# Patient Record
Sex: Female | Born: 1950 | Race: White | Hispanic: No | Marital: Married | Smoking: Current every day smoker
Health system: Southern US, Community
[De-identification: ages and names within clinical notes are randomized; demographics above are authoritative.]

## PROBLEM LIST (undated history)

## (undated) DIAGNOSIS — G932 Benign intracranial hypertension: Secondary | ICD-10-CM

## (undated) DIAGNOSIS — C259 Malignant neoplasm of pancreas, unspecified: Secondary | ICD-10-CM

## (undated) DIAGNOSIS — M8580 Other specified disorders of bone density and structure, unspecified site: Secondary | ICD-10-CM

## (undated) DIAGNOSIS — Z800 Family history of malignant neoplasm of digestive organs: Secondary | ICD-10-CM

## (undated) DIAGNOSIS — Z803 Family history of malignant neoplasm of breast: Secondary | ICD-10-CM

## (undated) DIAGNOSIS — D649 Anemia, unspecified: Secondary | ICD-10-CM

## (undated) DIAGNOSIS — I2510 Atherosclerotic heart disease of native coronary artery without angina pectoris: Secondary | ICD-10-CM

## (undated) DIAGNOSIS — I499 Cardiac arrhythmia, unspecified: Secondary | ICD-10-CM

## (undated) DIAGNOSIS — J449 Chronic obstructive pulmonary disease, unspecified: Secondary | ICD-10-CM

## (undated) DIAGNOSIS — I10 Essential (primary) hypertension: Secondary | ICD-10-CM

## (undated) DIAGNOSIS — M1990 Unspecified osteoarthritis, unspecified site: Secondary | ICD-10-CM

## (undated) DIAGNOSIS — E785 Hyperlipidemia, unspecified: Secondary | ICD-10-CM

## (undated) DIAGNOSIS — K219 Gastro-esophageal reflux disease without esophagitis: Secondary | ICD-10-CM

## (undated) DIAGNOSIS — R918 Other nonspecific abnormal finding of lung field: Secondary | ICD-10-CM

## (undated) DIAGNOSIS — I2584 Coronary atherosclerosis due to calcified coronary lesion: Secondary | ICD-10-CM

## (undated) HISTORY — DX: Coronary atherosclerosis due to calcified coronary lesion: I25.84

## (undated) HISTORY — DX: Cardiac arrhythmia, unspecified: I49.9

## (undated) HISTORY — PX: TUBAL LIGATION: SHX77

## (undated) HISTORY — DX: Gastro-esophageal reflux disease without esophagitis: K21.9

## (undated) HISTORY — DX: Family history of malignant neoplasm of digestive organs: Z80.0

## (undated) HISTORY — DX: Benign intracranial hypertension: G93.2

## (undated) HISTORY — DX: Atherosclerotic heart disease of native coronary artery without angina pectoris: I25.10

## (undated) HISTORY — DX: Hyperlipidemia, unspecified: E78.5

## (undated) HISTORY — DX: Anemia, unspecified: D64.9

## (undated) HISTORY — DX: Unspecified osteoarthritis, unspecified site: M19.90

## (undated) HISTORY — DX: Other nonspecific abnormal finding of lung field: R91.8

## (undated) HISTORY — PX: VARICOSE VEIN SURGERY: SHX832

## (undated) HISTORY — PX: CHOLECYSTECTOMY: SHX55

## (undated) HISTORY — DX: Family history of malignant neoplasm of breast: Z80.3

## (undated) HISTORY — DX: Chronic obstructive pulmonary disease, unspecified: J44.9

## (undated) HISTORY — DX: Essential (primary) hypertension: I10

## (undated) HISTORY — DX: Other specified disorders of bone density and structure, unspecified site: M85.80

---

## 2015-03-05 ENCOUNTER — Encounter: Payer: Self-pay | Admitting: Vascular Surgery

## 2015-03-05 ENCOUNTER — Other Ambulatory Visit: Payer: Self-pay

## 2015-03-05 DIAGNOSIS — I872 Venous insufficiency (chronic) (peripheral): Secondary | ICD-10-CM

## 2015-03-05 DIAGNOSIS — R600 Localized edema: Secondary | ICD-10-CM

## 2015-04-16 ENCOUNTER — Encounter: Payer: Self-pay | Admitting: Vascular Surgery

## 2015-04-21 ENCOUNTER — Ambulatory Visit (INDEPENDENT_AMBULATORY_CARE_PROVIDER_SITE_OTHER): Payer: 59 | Admitting: Vascular Surgery

## 2015-04-21 ENCOUNTER — Encounter: Payer: Self-pay | Admitting: Vascular Surgery

## 2015-04-21 ENCOUNTER — Ambulatory Visit (HOSPITAL_COMMUNITY)
Admission: RE | Admit: 2015-04-21 | Discharge: 2015-04-21 | Disposition: A | Discharge status: Home / Self Care | Payer: 59 | Source: Ambulatory Visit | Attending: Vascular Surgery | Admitting: Vascular Surgery

## 2015-04-21 VITALS — BP 119/82 | HR 95 | Temp 98.2°F | Resp 18 | Ht 66.75 in | Wt 163.9 lb

## 2015-04-21 DIAGNOSIS — E785 Hyperlipidemia, unspecified: Secondary | ICD-10-CM | POA: Insufficient documentation

## 2015-04-21 DIAGNOSIS — R600 Localized edema: Secondary | ICD-10-CM | POA: Diagnosis not present

## 2015-04-21 DIAGNOSIS — I872 Venous insufficiency (chronic) (peripheral): Secondary | ICD-10-CM | POA: Insufficient documentation

## 2015-04-21 DIAGNOSIS — I10 Essential (primary) hypertension: Secondary | ICD-10-CM | POA: Insufficient documentation

## 2015-04-21 NOTE — Progress Notes (Signed)
[facility] [place]  Patient name: Rebecca Weaver MRN: [id number] DOB: 1950-08-09 Sex: female  REASON FOR CONSULT: leg swelling and edema. Referred by Dewayne Shorter, PA  HPI: Rebecca Weaver is a 65 y.o. female, with a long history of chronic venous insufficiency. She's had multiple procedures of both lower extremities for varicose veins. This was all done in [place]. She had stripping procedures in both legs initially as early as the late 1960s. In the last 10 years she has had ablation procedures on both lower extremities. 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 She has aching pain heaviness and swelling in both lower extremities which is aggravated by standing and relieved with elevation. She wears her knee-high 20-30 mmHg compression stockings faithfully. She is unaware of any previous history of DVT or phlebitis. She does have a family history of varicose veins.  She also uses a lymphedema pump.  I have reviewed the records that were sent with the patient. She's had multiple procedures on varicose veins of both lower extremities starting in 1980.  Past Medical History  Diagnosis Date  . Hypertension   . Hyperlipidemia   . GERD (gastroesophageal reflux disease)   . COPD (chronic obstructive pulmonary disease) ([place])   . Anemia   . Coronary artery calcification   . Lung nodules   . Osteopenia   . Arthritis   . Pseudotumor cerebri     Family History  Problem Relation Age of Onset  . Varicose Veins Father   . Varicose Veins Sister     SOCIAL HISTORY: Social History   Social History  . Marital Status: Married    Spouse Name: N/A  . Number of Children: N/A  . Years of Education: N/A   Occupational History  . Not on file.   Social History Main Topics  . Smoking status: Current Every Day Smoker -- 0.75 packs/day    Types: Cigarettes  . Smokeless tobacco: Not on file  . Alcohol Use: 0.0 oz/week    0  Standard drinks or equivalent per week     Comment: social occassions only  . Drug Use: Not on file  . Sexual Activity: Not on file   Other Topics Concern  . Not on file   Social History Narrative    Allergies  Allergen Reactions  . Ciprofloxacin     Per patient  "swollen tongue and breathing problems"   . Sulfa Antibiotics     Per patient "swollen tongue and breathing problems"    Current Outpatient Prescriptions  Medication Sig Dispense Refill  . amLODipine-benazepril (LOTREL) 5-20 MG capsule Take 1 capsule by mouth daily.    Marland Kitchen ezetimibe (ZETIA) 10 MG tablet Take 10 mg by mouth daily.    . fluticasone (FLONASE) 50 MCG/ACT nasal spray Place into both nostrils daily.    . Fluticasone-Salmeterol (ADVAIR) 250-50 MCG/DOSE AEPB Inhale 1 puff into the lungs 2 (two) times daily.    . hydrochlorothiazide (MICROZIDE) 12.5 MG capsule Take 12.5 mg by mouth daily.    Marland Kitchen loratadine (CLARITIN) 10 MG tablet Take 10 mg by mouth daily.    . ranitidine (ZANTAC) 75 MG tablet Take 75 mg by mouth 2 (two) times daily.    Marland Kitchen VITAMIN B1-B12 IJ Inject as directed every 30 (thirty) days.     No current facility-administered medications for this visit.    REVIEW OF SYSTEMS:  [X]  denotes positive finding, [ ]  denotes negative finding Cardiac  Comments:  Chest pain or chest pressure:    Shortness of breath upon exertion:    Short of breath when lying flat:    Irregular heart rhythm:        Vascular    Pain in calf, thigh, or hip brought on by ambulation:    Pain in feet at night that wakes you up from your sleep:     Blood clot in your veins:    Leg swelling:  X       Pulmonary    Oxygen at home:    Productive cough:     Wheezing:         Neurologic    Sudden weakness in arms or legs:     Sudden numbness in arms or legs:     Sudden onset of difficulty speaking or slurred speech:    Temporary loss of vision in one eye:     Problems with dizziness:         Gastrointestinal    Blood in  stool:     Vomited blood:         Genitourinary    Burning when urinating:     Blood in urine:        Psychiatric    Major depression:         Hematologic    Bleeding problems:    Problems with blood clotting too easily:        Skin    Rashes or ulcers:        Constitutional    Fever or chills:      PHYSICAL EXAM: Filed Vitals:   04/21/15 1316  BP: 119/82  Pulse: 95  Temp: 98.2 F (36.8 C)  TempSrc: Oral  Resp: 18  Height: 5' 6.75" (1.695 m)  Weight: 163 lb 14.4 oz (74.345 kg)  SpO2: 93%    GENERAL: The patient is a well-nourished female, in no acute distress. The vital signs are documented above. CARDIAC: There is a regular rate and rhythm.  VASCULAR: I do not detect carotid bruits. He has palpable femoral pulses. I cannot palpate pedal pulses. However, she has biphasic posterior tibial signals bilaterally. She has monophasic dorsalis pedis signals. She has mild bilateral lower tree swelling which is more significant on the left side. PULMONARY: There is good air exchange bilaterally without wheezing or rales. ABDOMEN: Soft and non-tender with normal pitched bowel sounds.  MUSCULOSKELETAL: There are no major deformities or cyanosis. NEUROLOGIC: No focal weakness or paresthesias are detected. SKIN: She has hyperpigmentation bilaterally consistent with chronic venous insufficiency. She has significant varicose veins of both lower extremities. PSYCHIATRIC: The patient has a normal affect.  DATA:   LOWER EXTREMITY VENOUS DUPLEX: I have independently interpreted her lower extremity venous duplex.  On the right side, there is no evidence of DVT. The right great saphenous vein has been ablated. There is reflux in the deep system on the right involving the common femoral vein, femoral vein and popliteal vein.  On the left side, there is no evidence of DVT. The left great saphenous vein and small saphenous vein have been ablated. There is deep vein reflux involving the  common femoral vein and femoral vein on the left.   MEDICAL ISSUES:  CHRONIC VENOUS INSUFFICIENCY: Her reflux involves the deep veins only. Her saphenous veins have been stripped and ablated. She does have significant varicose veins but excision of these would likely recur given her underlying deep venous reflux.  I've explained that chronic venous insufficiency is  a chronic problem. We have discussed the importance of intermittent leg elevation in the proper positioning for this. In addition, I have discussed the importance of wearing her compression stockings chest she does..  I have encouraged her to stay as active as possible and to avoid prolonged sitting and standing. We have also discussed water aerobics which I think is also very helpful for people with chronic venous insufficiency. See her back as needed.  Deitra Mayo [facility] [place]: [phone]

## 2015-04-28 ENCOUNTER — Encounter (INDEPENDENT_AMBULATORY_CARE_PROVIDER_SITE_OTHER): Payer: 59 | Admitting: Ophthalmology

## 2015-04-28 DIAGNOSIS — H35371 Puckering of macula, right eye: Secondary | ICD-10-CM | POA: Diagnosis not present

## 2015-04-28 DIAGNOSIS — I10 Essential (primary) hypertension: Secondary | ICD-10-CM

## 2015-04-28 DIAGNOSIS — H2513 Age-related nuclear cataract, bilateral: Secondary | ICD-10-CM

## 2015-04-28 DIAGNOSIS — H35033 Hypertensive retinopathy, bilateral: Secondary | ICD-10-CM

## 2015-04-28 DIAGNOSIS — H33021 Retinal detachment with multiple breaks, right eye: Secondary | ICD-10-CM

## 2015-04-28 DIAGNOSIS — H43813 Vitreous degeneration, bilateral: Secondary | ICD-10-CM | POA: Diagnosis not present

## 2015-05-14 ENCOUNTER — Ambulatory Visit (INDEPENDENT_AMBULATORY_CARE_PROVIDER_SITE_OTHER): Payer: 59 | Admitting: Ophthalmology

## 2015-05-14 DIAGNOSIS — H33301 Unspecified retinal break, right eye: Secondary | ICD-10-CM

## 2015-05-21 ENCOUNTER — Ambulatory Visit (INDEPENDENT_AMBULATORY_CARE_PROVIDER_SITE_OTHER): Payer: 59 | Admitting: Ophthalmology

## 2015-05-21 DIAGNOSIS — H33301 Unspecified retinal break, right eye: Secondary | ICD-10-CM

## 2015-09-23 ENCOUNTER — Ambulatory Visit (INDEPENDENT_AMBULATORY_CARE_PROVIDER_SITE_OTHER): Payer: 59 | Admitting: Ophthalmology

## 2015-09-30 ENCOUNTER — Ambulatory Visit (INDEPENDENT_AMBULATORY_CARE_PROVIDER_SITE_OTHER): Payer: 59 | Admitting: Ophthalmology

## 2015-09-30 DIAGNOSIS — H33301 Unspecified retinal break, right eye: Secondary | ICD-10-CM

## 2015-09-30 DIAGNOSIS — H43813 Vitreous degeneration, bilateral: Secondary | ICD-10-CM | POA: Diagnosis not present

## 2015-09-30 DIAGNOSIS — H2513 Age-related nuclear cataract, bilateral: Secondary | ICD-10-CM | POA: Diagnosis not present

## 2015-09-30 DIAGNOSIS — I10 Essential (primary) hypertension: Secondary | ICD-10-CM

## 2015-09-30 DIAGNOSIS — H35033 Hypertensive retinopathy, bilateral: Secondary | ICD-10-CM

## 2017-11-06 ENCOUNTER — Other Ambulatory Visit: Payer: Self-pay | Admitting: Family Medicine

## 2017-11-06 ENCOUNTER — Ambulatory Visit
Admission: RE | Admit: 2017-11-06 | Discharge: 2017-11-06 | Disposition: A | Discharge status: Home / Self Care | Payer: Medicare Other | Source: Ambulatory Visit | Attending: Family Medicine | Admitting: Family Medicine

## 2017-11-06 DIAGNOSIS — M545 Low back pain, unspecified: Secondary | ICD-10-CM

## 2017-11-13 ENCOUNTER — Other Ambulatory Visit: Payer: Self-pay | Admitting: Family Medicine

## 2017-11-13 DIAGNOSIS — Z87891 Personal history of nicotine dependence: Secondary | ICD-10-CM

## 2017-11-21 ENCOUNTER — Ambulatory Visit
Admission: RE | Admit: 2017-11-21 | Discharge: 2017-11-21 | Disposition: A | Discharge status: Home / Self Care | Payer: Medicare Other | Source: Ambulatory Visit | Attending: Family Medicine | Admitting: Family Medicine

## 2017-11-21 DIAGNOSIS — Z87891 Personal history of nicotine dependence: Secondary | ICD-10-CM

## 2019-06-02 ENCOUNTER — Ambulatory Visit: Payer: Medicare Other | Admitting: Cardiovascular Disease

## 2019-12-29 ENCOUNTER — Emergency Department (HOSPITAL_COMMUNITY): Payer: Medicare Other

## 2019-12-29 ENCOUNTER — Inpatient Hospital Stay (HOSPITAL_COMMUNITY)
Admission: EM | Admit: 2019-12-29 | Discharge: 2019-12-31 | DRG: 308 | Disposition: A | Discharge status: Home / Self Care | Payer: Medicare Other | Attending: Internal Medicine | Admitting: Internal Medicine

## 2019-12-29 ENCOUNTER — Encounter (HOSPITAL_COMMUNITY): Payer: Self-pay

## 2019-12-29 ENCOUNTER — Other Ambulatory Visit: Payer: Self-pay

## 2019-12-29 DIAGNOSIS — Z881 Allergy status to other antibiotic agents status: Secondary | ICD-10-CM

## 2019-12-29 DIAGNOSIS — K869 Disease of pancreas, unspecified: Secondary | ICD-10-CM | POA: Diagnosis present

## 2019-12-29 DIAGNOSIS — I2510 Atherosclerotic heart disease of native coronary artery without angina pectoris: Secondary | ICD-10-CM | POA: Diagnosis present

## 2019-12-29 DIAGNOSIS — I8390 Asymptomatic varicose veins of unspecified lower extremity: Secondary | ICD-10-CM | POA: Diagnosis present

## 2019-12-29 DIAGNOSIS — F17210 Nicotine dependence, cigarettes, uncomplicated: Secondary | ICD-10-CM | POA: Diagnosis present

## 2019-12-29 DIAGNOSIS — R918 Other nonspecific abnormal finding of lung field: Secondary | ICD-10-CM | POA: Diagnosis present

## 2019-12-29 DIAGNOSIS — Z20822 Contact with and (suspected) exposure to covid-19: Secondary | ICD-10-CM | POA: Diagnosis present

## 2019-12-29 DIAGNOSIS — R634 Abnormal weight loss: Secondary | ICD-10-CM | POA: Diagnosis present

## 2019-12-29 DIAGNOSIS — M1990 Unspecified osteoarthritis, unspecified site: Secondary | ICD-10-CM | POA: Diagnosis present

## 2019-12-29 DIAGNOSIS — G932 Benign intracranial hypertension: Secondary | ICD-10-CM | POA: Diagnosis present

## 2019-12-29 DIAGNOSIS — J449 Chronic obstructive pulmonary disease, unspecified: Secondary | ICD-10-CM | POA: Diagnosis present

## 2019-12-29 DIAGNOSIS — I4892 Unspecified atrial flutter: Secondary | ICD-10-CM | POA: Diagnosis not present

## 2019-12-29 DIAGNOSIS — I959 Hypotension, unspecified: Secondary | ICD-10-CM | POA: Diagnosis present

## 2019-12-29 DIAGNOSIS — K838 Other specified diseases of biliary tract: Secondary | ICD-10-CM | POA: Diagnosis present

## 2019-12-29 DIAGNOSIS — I4891 Unspecified atrial fibrillation: Secondary | ICD-10-CM | POA: Diagnosis present

## 2019-12-29 DIAGNOSIS — E782 Mixed hyperlipidemia: Secondary | ICD-10-CM | POA: Diagnosis present

## 2019-12-29 DIAGNOSIS — Q453 Other congenital malformations of pancreas and pancreatic duct: Secondary | ICD-10-CM

## 2019-12-29 DIAGNOSIS — J9601 Acute respiratory failure with hypoxia: Secondary | ICD-10-CM | POA: Diagnosis present

## 2019-12-29 DIAGNOSIS — Z882 Allergy status to sulfonamides status: Secondary | ICD-10-CM

## 2019-12-29 DIAGNOSIS — R1013 Epigastric pain: Secondary | ICD-10-CM | POA: Diagnosis not present

## 2019-12-29 DIAGNOSIS — Z79899 Other long term (current) drug therapy: Secondary | ICD-10-CM

## 2019-12-29 DIAGNOSIS — D7589 Other specified diseases of blood and blood-forming organs: Secondary | ICD-10-CM | POA: Diagnosis present

## 2019-12-29 DIAGNOSIS — I10 Essential (primary) hypertension: Secondary | ICD-10-CM | POA: Diagnosis present

## 2019-12-29 DIAGNOSIS — I872 Venous insufficiency (chronic) (peripheral): Secondary | ICD-10-CM | POA: Diagnosis present

## 2019-12-29 DIAGNOSIS — Z7951 Long term (current) use of inhaled steroids: Secondary | ICD-10-CM

## 2019-12-29 LAB — CBC
HCT: 44.9 % (ref 36.0–46.0)
Hemoglobin: 15 g/dL (ref 12.0–15.0)
MCH: 34.4 pg — ABNORMAL HIGH (ref 26.0–34.0)
MCHC: 33.4 g/dL (ref 30.0–36.0)
MCV: 103 fL — ABNORMAL HIGH (ref 80.0–100.0)
Platelets: 199 10*3/uL (ref 150–400)
RBC: 4.36 MIL/uL (ref 3.87–5.11)
RDW: 12.6 % (ref 11.5–15.5)
WBC: 8 10*3/uL (ref 4.0–10.5)
nRBC: 0 % (ref 0.0–0.2)

## 2019-12-29 LAB — COMPREHENSIVE METABOLIC PANEL
ALT: 16 U/L (ref 0–44)
AST: 16 U/L (ref 15–41)
Albumin: 3.7 g/dL (ref 3.5–5.0)
Alkaline Phosphatase: 52 U/L (ref 38–126)
Anion gap: 12 (ref 5–15)
BUN: 15 mg/dL (ref 8–23)
CO2: 24 mmol/L (ref 22–32)
Calcium: 9 mg/dL (ref 8.9–10.3)
Chloride: 101 mmol/L (ref 98–111)
Creatinine, Ser: 0.79 mg/dL (ref 0.44–1.00)
GFR, Estimated: >60 mL/min (ref >60)
Glucose, Bld: 81 mg/dL (ref 70–99)
Potassium: 3.7 mmol/L (ref 3.5–5.1)
Sodium: 137 mmol/L (ref 135–145)
Total Bilirubin: 1.1 mg/dL (ref 0.3–1.2)
Total Protein: 6 g/dL — ABNORMAL LOW (ref 6.5–8.1)

## 2019-12-29 LAB — URINALYSIS, ROUTINE W REFLEX MICROSCOPIC
Bilirubin Urine: NEGATIVE
Glucose, UA: NEGATIVE mg/dL
Hgb urine dipstick: NEGATIVE
Ketones, ur: NEGATIVE mg/dL
Leukocytes,Ua: NEGATIVE
Nitrite: NEGATIVE
Protein, ur: NEGATIVE mg/dL
Specific Gravity, Urine: 1.01 (ref 1.005–1.030)
pH: 5 (ref 5.0–8.0)

## 2019-12-29 LAB — BRAIN NATRIURETIC PEPTIDE: B Natriuretic Peptide: 65.4 pg/mL (ref 0.0–100.0)

## 2019-12-29 LAB — LIPASE, BLOOD: Lipase: 132 U/L — ABNORMAL HIGH (ref 11–51)

## 2019-12-29 LAB — TROPONIN I (HIGH SENSITIVITY): Troponin I (High Sensitivity): 7 ng/L (ref <18)

## 2019-12-29 MED ORDER — DILTIAZEM HCL-DEXTROSE 125-5 MG/125ML-% IV SOLN (PREMIX)
5.0000 mg/h | INTRAVENOUS | Status: DC
  Administered 2019-12-29: 5 mg/h via INTRAVENOUS
  Filled 2019-12-29: qty 125

## 2019-12-29 MED ORDER — HEPARIN BOLUS VIA INFUSION
3000.0000 [IU] | Freq: Once | INTRAVENOUS | Status: DC
  Filled 2019-12-29: qty 3000

## 2019-12-29 MED ORDER — ONDANSETRON HCL 4 MG/2ML IJ SOLN
4.0000 mg | Freq: Once | INTRAMUSCULAR | Status: AC
  Administered 2019-12-29: 4 mg via INTRAVENOUS
  Filled 2019-12-29: qty 2

## 2019-12-29 MED ORDER — MORPHINE SULFATE (PF) 4 MG/ML IV SOLN
4.0000 mg | Freq: Once | INTRAVENOUS | Status: AC
  Administered 2019-12-29: 4 mg via INTRAVENOUS
  Filled 2019-12-29: qty 1

## 2019-12-29 MED ORDER — IOHEXOL 350 MG/ML SOLN
100.0000 mL | Freq: Once | INTRAVENOUS | Status: AC | PRN
  Administered 2019-12-29: 100 mL via INTRAVENOUS

## 2019-12-29 MED ORDER — DILTIAZEM LOAD VIA INFUSION
10.0000 mg | Freq: Once | INTRAVENOUS | Status: AC
  Administered 2019-12-29: 10 mg via INTRAVENOUS
  Filled 2019-12-29: qty 10

## 2019-12-29 MED ORDER — HEPARIN (PORCINE) 25000 UT/250ML-% IV SOLN: 900.0000 [IU]/h | INTRAVENOUS | Status: DC

## 2019-12-29 MED ORDER — SODIUM CHLORIDE 0.9 % IV BOLUS
500.0000 mL | Freq: Once | INTRAVENOUS | Status: AC
  Administered 2019-12-29: 500 mL via INTRAVENOUS

## 2019-12-29 NOTE — ED Triage Notes (Signed)
Pt from pcp office with ems for further evaluation of LUQ pain for 3 days, no n/v. Hx of diverticulitis, no bloody stools noted. Pt also in a fib rvr at a rate of 150 upon ems arrival, after 500 fluid pt rate ranging from 115-125bpm. Pt a.o, nad noted

## 2019-12-29 NOTE — Progress Notes (Addendum)
ANTICOAGULATION CONSULT NOTE - Initial Consult  Pharmacy Consult for apixaban Indication: atrial fibrillation  Allergies  Allergen Reactions  . Ciprofloxacin     Per patient  "swollen tongue and breathing problems"   . Sulfa Antibiotics     Per patient "swollen tongue and breathing problems"    Patient Measurements: Height: 5\' 7"  (170.2 cm) Weight: 64.4 kg (142 lb) IBW/kg (Calculated) : 61.6  Vital Signs: Temp: 97.8 F (36.6 C) (10/18 2116) Temp Source: Oral (10/18 2116) BP: 106/68 (10/18 2330) Pulse Rate: 107 (10/18 2330)  Labs: Recent Labs    12/29/19 1821 12/29/19 2138  HGB 15.0  --   HCT 44.9  --   PLT 199  --   CREATININE 0.79  --   TROPONINIHS  --  7    Estimated Creatinine Clearance: 66.4 mL/min (by C-G formula based on SCr of 0.79 mg/dL).   Medical History: Past Medical History:  Diagnosis Date  . Anemia   . Arthritis   . COPD (chronic obstructive pulmonary disease) ([place])   . Coronary artery calcification   . GERD (gastroesophageal reflux disease)   . Hyperlipidemia   . Hypertension   . Lung nodules   . Osteopenia   . Pseudotumor cerebri     Assessment: 69yo female c/o abdominal pain and tachycardia, found to be in Aflutter w/ RVR, no h/o dysrhythmias, to begin anticoagulation with Eliquis.  Plan:  Eliquis 5mg  PO BID. Start Eliquis education.  Wynona Neat, PharmD, BCPS  12/29/2019,11:50 PM

## 2019-12-29 NOTE — ED Provider Notes (Incomplete)
[facility] EMERGENCY DEPARTMENT Provider Note   CSN: [id number] Arrival date & time: 12/29/19  1811     History Chief Complaint  Patient presents with  . Abdominal Pain  . Tachycardia    Rebecca Weaver is a 69 y.o. female.  HPI     Past Medical History:  Diagnosis Date  . Anemia   . Arthritis   . COPD (chronic obstructive pulmonary disease) ([place])   . Coronary artery calcification   . GERD (gastroesophageal reflux disease)   . Hyperlipidemia   . Hypertension   . Lung nodules   . Osteopenia   . Pseudotumor cerebri     There are no problems to display for this patient.   Past Surgical History:  Procedure Laterality Date  . CHOLECYSTECTOMY    . TUBAL LIGATION    . VARICOSE VEIN SURGERY     multiple     OB History   No obstetric history on file.     Family History  Problem Relation Age of Onset  . Varicose Veins Father   . Varicose Veins Sister     Social History   Tobacco Use  . Smoking status: Current Every Day Smoker    Packs/day: 0.75    Types: Cigarettes  Substance Use Topics  . Alcohol use: Yes    Alcohol/week: 0.0 standard drinks    Comment: social occassions only  . Drug use: Not on file    Home Medications Prior to Admission medications   Medication Sig Start Date End Date Taking? Authorizing Provider  amLODipine-benazepril (LOTREL) 5-20 MG capsule Take 1 capsule by mouth daily.    [provider]  ezetimibe (ZETIA) 10 MG tablet Take 10 mg by mouth daily.    [provider]  fluticasone (FLONASE) 50 MCG/ACT nasal spray Place into both nostrils daily.    [provider]  Fluticasone-Salmeterol (ADVAIR) 250-50 MCG/DOSE AEPB Inhale 1 puff into the lungs 2 (two) times daily.    [provider]  hydrochlorothiazide (MICROZIDE) 12.5 MG capsule Take 12.5 mg by mouth daily.    [provider]  loratadine (CLARITIN) 10 MG tablet Take 10 mg by mouth daily.    [provider]  ranitidine (ZANTAC) 75 MG tablet Take 75 mg by mouth 2 (two) times daily.    [provider]  VITAMIN B1-B12 IJ Inject as directed every 30 (thirty) days.    [provider]    Allergies    Ciprofloxacin and Sulfa antibiotics  Review of Systems   Review of Systems  Physical Exam Updated Vital Signs BP 102/87   Pulse 66   Temp 97.8 F (36.6 C) (Oral)   Resp 18   Ht 5\' 7"  (1.702 m)   Wt 64.4 kg   SpO2 96%   BMI 22.24 kg/m   Physical Exam  ED Results / Procedures / Treatments   Labs (all labs ordered are listed, but only abnormal results are displayed) Labs Reviewed  LIPASE, BLOOD - Abnormal; Notable for the following components:      Result Value   Lipase 132 (*)    All other components within normal limits  COMPREHENSIVE METABOLIC PANEL - Abnormal; Notable for the following components:   Total Protein 6.0 (*)    All other components within normal limits  CBC - Abnormal; Notable for the following components:   MCV 103.0 (*)    MCH 34.4 (*)    All other components within normal limits  URINALYSIS, ROUTINE  W REFLEX MICROSCOPIC    EKG EKG Interpretation  Date/Time:  Monday December 29 2019 18:20:14 EDT Ventricular Rate:  142 PR Interval:  134 QRS Duration: 116 QT Interval:  320 QTC Calculation: 492 R Axis:   92 Text Interpretation: Unclear rhythm, suspect sinus tachycardia Incomplete right bundle branch block Possible Right ventricular hypertrophy Septal infarct , age undetermined ST & T wave abnormality, consider inferior ischemia ST & T wave abnormality, consider anterolateral ischemia Abnormal ECG Confirmed by Gareth Morgan [phone]) on 12/29/2019 9:02:09 PM   Radiology DG Chest 2 View  Result Date: 12/29/2019 CLINICAL DATA:  Tachycardia EXAM: CHEST - 2 VIEW COMPARISON:  06/06/2016, CT 12/04/2018 FINDINGS: Emphysematous disease and hyperinflation. No acute consolidation or pleural effusion. Stable cardiomediastinal silhouette with  prominent central pulmonary arteries. Aortic atherosclerosis. No pneumothorax. IMPRESSION: No active cardiopulmonary disease. Emphysematous disease. Electronically Signed   By: Donavan Foil M.D.   On: 12/29/2019 18:39    Procedures Procedures (including critical care time)  Medications Ordered in ED Medications - No data to display  ED Course  I have reviewed the triage vital signs and the nursing notes.  Pertinent labs & imaging results that were available during my care of the patient were reviewed by me and considered in my medical decision making (see chart for details).    MDM Rules/Calculators/A&P                          *** Final Clinical Impression(s) / ED Diagnoses Final diagnoses:  None    Rx / DC Orders ED Discharge Orders    None

## 2019-12-29 NOTE — ED Notes (Signed)
Patient transported to CT 

## 2019-12-29 NOTE — ED Notes (Signed)
Pt dropped to 88% on room air. This writer placed pt on O2 2L . Pt at 94%.

## 2019-12-29 NOTE — ED Provider Notes (Signed)
[facility]ER[facility]ENC[facility] DEPARTMENT Provider Note   CSN: [id number] Arrival date & time: 12/29/19  1811     History Chief Complaint  Patient presents with  . Abdominal Pain  . Tachycardia    Rebecca Weaver is a 69 y.o. female.  HPI      69 year old female with history of COPD, coronary artery calcification, hypertension, hyperlipidemia, pseudotumor cerebri, vitamin B12 deficiency, who presents with concern for abdominal pain and found to have tachycardia/atrial flutter on PCP visit today.  Reports over the weekend developed abdominal pain that she describes as epigastric going from the right and to the left upper quadrant.  Reports that the pain has been constant and was associated with nausea, vomiting and diarrhea.  Reports that she had 6 episodes of nonbloody nonblack stools, and 4 episodes of vomiting.  Reports that since this morning she is no longer having nausea, vomiting or diarrhea.  Denies fevers.  Denies dysuria.  Reports that she was just on a trip to [place], where she flew to [place] and then drove approximately 2000 miles in the car.  Reports she smokes cigarettes, and socially drinks alcohol, but has not been drinking more recently.  She noticed that a vein in her left leg appeared hard.  Reports she did not realize that her heart rate was so higher that she was in an abnormal heart rhythm until her PCP had told her.  Denies chest pain, shortness of breath, lightheadedness.  Does not have any history of cardiac abnormalities.  Past Medical History:  Diagnosis Date  . Anemia   . Arthritis   . COPD (chronic obstructive pulmonary disease) ([place])   . Coronary artery calcification   . GERD (gastroesophageal reflux disease)   . Hyperlipidemia   . Hypertension   . Lung nodules   . Osteopenia   . Pseudotumor cerebri     There are no problems to display for this patient.   Past Surgical History:  Procedure Laterality Date  . CHOLECYSTECTOMY    .  TUBAL LIGATION    . VARICOSE VEIN SURGERY     multiple     OB History   No obstetric history on file.     Family History  Problem Relation Age of Onset  . Varicose Veins Father   . Varicose Veins Sister     Social History   Tobacco Use  . Smoking status: Current Every Day Smoker    Packs/day: 0.75    Types: Cigarettes  Substance Use Topics  . Alcohol use: Yes    Alcohol/week: 0.0 standard drinks    Comment: social occassions only  . Drug use: Not on file    Home Medications Prior to Admission medications   Medication Sig Start Date End Date Taking? Authorizing Provider  amLODipine-benazepril (LOTREL) 5-20 MG capsule Take 1 capsule by mouth daily.    [provider]  ezetimibe (ZETIA) 10 MG tablet Take 10 mg by mouth daily.    [provider]  fluticasone (FLONASE) 50 MCG/ACT nasal spray Place into both nostrils daily.    [provider]  Fluticasone-Salmeterol (ADVAIR) 250-50 MCG/DOSE AEPB Inhale 1 puff into the lungs 2 (two) times daily.    [provider]  hydrochlorothiazide (MICROZIDE) 12.5 MG capsule Take 12.5 mg by mouth daily.    [provider]  loratadine (CLARITIN) 10 MG tablet Take 10 mg by mouth daily.    [provider]  ranitidine (ZANTAC) 75 MG tablet Take 75 mg by mouth  2 (two) times daily.    [provider]  VITAMIN B1-B12 IJ Inject as directed every 30 (thirty) days.    [provider]    Allergies    Ciprofloxacin and Sulfa antibiotics  Review of Systems   Review of Systems  Constitutional: Positive for fatigue. Negative for fever.  HENT: Negative for sore throat.   Eyes: Negative for visual disturbance.  Respiratory: Negative for cough and shortness of breath.   Cardiovascular: Positive for leg swelling. Negative for chest pain.  Gastrointestinal: Positive for abdominal pain, diarrhea, nausea and vomiting.  Genitourinary: Negative for difficulty urinating and dysuria.    Musculoskeletal: Negative for back pain and neck pain.  Skin: Negative for rash.  Neurological: Negative for syncope, light-headedness and headaches.    Physical Exam Updated Vital Signs BP 106/68   Pulse (!) 107   Temp 97.8 F (36.6 C) (Oral)   Resp 15   Ht 5\' 7"  (1.702 m)   Wt 64.4 kg   SpO2 95%   BMI 22.24 kg/m   Physical Exam Vitals and nursing note reviewed.  Constitutional:      General: She is not in acute distress.    Appearance: She is well-developed. She is not diaphoretic.  HENT:     Head: Normocephalic and atraumatic.  Eyes:     Conjunctiva/sclera: Conjunctivae normal.  Cardiovascular:     Rate and Rhythm: Tachycardia present. Rhythm irregularly irregular.     Heart sounds: Normal heart sounds. No murmur heard.  No friction rub. No gallop.   Pulmonary:     Effort: Pulmonary effort is normal. No respiratory distress.     Breath sounds: Normal breath sounds. No wheezing or rales.  Abdominal:     General: There is no distension.     Palpations: Abdomen is soft.     Tenderness: There is abdominal tenderness in the epigastric area. There is no guarding.  Musculoskeletal:        General: No tenderness.     Cervical back: Normal range of motion.  Skin:    General: Skin is warm and dry.     Findings: No erythema or rash.  Neurological:     Mental Status: She is alert and oriented to person, place, and time.     ED Results / Procedures / Treatments   Labs (all labs ordered are listed, but only abnormal results are displayed) Labs Reviewed  LIPASE, BLOOD - Abnormal; Notable for the following components:      Result Value   Lipase 132 (*)    All other components within normal limits  COMPREHENSIVE METABOLIC PANEL - Abnormal; Notable for the following components:   Total Protein 6.0 (*)    All other components within normal limits  CBC - Abnormal; Notable for the following components:   MCV 103.0 (*)    MCH 34.4 (*)    All other components within  normal limits  RESPIRATORY PANEL BY RT PCR (FLU A&B, COVID)  URINALYSIS, ROUTINE W REFLEX MICROSCOPIC  BRAIN NATRIURETIC PEPTIDE  TROPONIN I (HIGH SENSITIVITY)  TROPONIN I (HIGH SENSITIVITY)    EKG EKG Interpretation  Date/Time:  Monday December 29 2019 22:22:11 EDT Ventricular Rate:  135 PR Interval:  134 QRS Duration: 109 QT Interval:  371 QTC Calculation: 557 R Axis:   101 Text Interpretation: Atrial flutter with predominant 2:1 AV block Right ventricular hypertrophy Repol abnrm suggests ischemia, diffuse leads Prolonged QT interval No significant change since last tracing Confirmed by Gareth Morgan [phone]) on  12/29/2019 10:37:05 PM   Radiology DG Chest 2 View  Result Date: 12/29/2019 CLINICAL DATA:  Tachycardia EXAM: CHEST - 2 VIEW COMPARISON:  06/06/2016, CT 12/04/2018 FINDINGS: Emphysematous disease and hyperinflation. No acute consolidation or pleural effusion. Stable cardiomediastinal silhouette with prominent central pulmonary arteries. Aortic atherosclerosis. No pneumothorax. IMPRESSION: No active cardiopulmonary disease. Emphysematous disease. Electronically Signed   By: Donavan Foil M.D.   On: 12/29/2019 18:39   CT Angio Chest PE W and/or Wo Contrast  Result Date: 12/29/2019 CLINICAL DATA:  69 year old female with left upper quadrant abdominal pain. History of diverticulitis. Concern for mesenteric ischemia. EXAM: CT ANGIOGRAPHY CHEST, ABDOMEN AND PELVIS TECHNIQUE: Non-contrast CT of the chest was initially obtained. Multidetector CT imaging through the chest, abdomen and pelvis was performed using the standard protocol during bolus administration of intravenous contrast. Multiplanar reconstructed images and MIPs were obtained and reviewed to evaluate the vascular anatomy. CONTRAST:  137mL OMNIPAQUE IOHEXOL 350 MG/ML SOLN COMPARISON:  Chest radiograph dated 12/29/2019 and CT dated 11/21/2017. FINDINGS: Evaluation is limited due to streak artifact 0 caused by patient's  arms. CTA CHEST FINDINGS Cardiovascular: There is no cardiomegaly or pericardial effusion. Three-vessel coronary vascular calcification. There is moderate atherosclerotic calcification of the thoracic aorta. No aneurysmal dilatation. Evaluation of the aorta is limited due to suboptimal opacification and timing of the contrast. No pulmonary artery embolus identified. Mediastinum/Nodes: Top-normal right hilar lymph node measures 11 mm. Subcarinal lymph node measures 14 mm in short axis. The esophagus is grossly unremarkable. No mediastinal fluid collection. Lungs/Pleura: There is an 11 mm right upper lobe nodule (35/1). This nodule is new since the prior CT and concerning for malignancy. A 6 mm left upper lobe nodule (43/1) also new compared to the prior CT. There is a 4 mm left upper lobe nodule (33/1). Several additional small nodules including a 4 mm subpleural nodule in the right lower lobe (90/1) and a 3 mm nodule in the right upper lobe (38/1). These nodules are new since the prior CT and concerning for malignancy or metastatic disease. No focal consolidation, pleural effusion, or pneumothorax. The central airways are patent. Musculoskeletal: There is a 15 mm lucent lesion with sclerotic margin in the T12, present on the prior CT. This lesion is indeterminate. There is osteopenia. A small T5 sclerotic focus was present on the prior CT and may represent a bone island. No acute osseous pathology. 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 Review of the MIP images confirms the above findings. CTA ABDOMEN AND PELVIS FINDINGS VASCULAR Aorta: Moderate atherosclerotic calcification. No aneurysmal dilatation or dissection. No periaortic fluid collection. Celiac: Patent without evidence of aneurysm, dissection, vasculitis or significant stenosis. SMA: Patent without evidence of aneurysm, dissection, vasculitis or significant stenosis. Renals: Both renal arteries are patent  without evidence of aneurysm, dissection, vasculitis, fibromuscular dysplasia or significant stenosis. IMA: Patent without evidence of aneurysm, dissection, vasculitis or significant stenosis. Inflow: Atherosclerotic calcification of the iliac arteries. No aneurysmal dilatation or dissection. The iliac arteries are patent. Veins: The IVC is unremarkable. The SMV, and main portal vein are patent. No portal venous gas. Review of the MIP images confirms the above findings. NON-VASCULAR No intra-abdominal free air or free fluid. Hepatobiliary: The liver is unremarkable. No intrahepatic biliary dilatation. The gallbladder contains air likely related to recent procedure. Clinical correlation recommended. No calcified gallstone or pericholecystic fluid. There is mild dilatation of the central biliary trees. The common bile duct measures 12 mm in diameter. No  calcified stone noted in the central CBD. Pancreas: There is an ill-defined hypodense lesion in the body of the pancreas measuring approximately 2.5 x 1.5 cm (36/6. This is not characterized but concerning for malignancy. Further characterization with MRI without and with contrast recommended. There is an ill-defined hypodense extra pancreatic nodular density posterior to the pancreas adjacent to the SMA likely extension of the pancreatic lesion. No active inflammatory changes of the pancreas. Spleen: Normal in size without focal abnormality. Adrenals/Urinary Tract: The adrenal glands unremarkable. Bilateral renal cortical scarring. Small right renal cysts measure up to 2 cm in the upper pole of the right kidney. There is no hydronephrosis on either side. The visualized ureters and urinary bladder appear unremarkable. Stomach/Bowel: There is severe sigmoid and diffuse colonic diverticulosis without active inflammatory changes. There is moderate stool throughout the colon. There is no bowel obstruction or active inflammation. The appendix is normal. Lymphatic: No  adenopathy. Reproductive: Ill-defined posterior body uterine fibroids measure up to approximately 4 cm. No adnexal masses. Other: Right groin surgical clips. Musculoskeletal: Osteopenia with degenerative changes of the spine. No acute osseous pathology. Review of the MIP images confirms the above findings. IMPRESSION: 1. No CT evidence of pulmonary embolism or aortic dissection. 2. Ill-defined hypodense lesion in the body of the pancreas concerning for malignancy. Further characterization with MRI without and with contrast recommended. 3. Multiple bilateral pulmonary nodules measure up to 11 mm in the right upper lobe concerning for malignancy or metastatic disease. Clinical correlation and multidisciplinary consult is advised. 4. Severe colonic diverticulosis. No bowel obstruction. Normal appendix. 5. Aortic Atherosclerosis (ICD10-I70.0). Electronically Signed   By: Anner Crete M.D.   On: 12/29/2019 22:39   CT Angio Abd/Pel W and/or Wo Contrast  Result Date: 12/29/2019 CLINICAL DATA:  69 year old female with left upper quadrant abdominal pain. History of diverticulitis. Concern for mesenteric ischemia. EXAM: CT ANGIOGRAPHY CHEST, ABDOMEN AND PELVIS TECHNIQUE: Non-contrast CT of the chest was initially obtained. Multidetector CT imaging through the chest, abdomen and pelvis was performed using the standard protocol during bolus administration of intravenous contrast. Multiplanar reconstructed images and MIPs were obtained and reviewed to evaluate the vascular anatomy. CONTRAST:  118mL OMNIPAQUE IOHEXOL 350 MG/ML SOLN COMPARISON:  Chest radiograph dated 12/29/2019 and CT dated 11/21/2017. FINDINGS: Evaluation is limited due to streak artifact 0 caused by patient's arms. CTA CHEST FINDINGS Cardiovascular: There is no cardiomegaly or pericardial effusion. Three-vessel coronary vascular calcification. There is moderate atherosclerotic calcification of the thoracic aorta. No aneurysmal dilatation. Evaluation of  the aorta is limited due to suboptimal opacification and timing of the contrast. No pulmonary artery embolus identified. Mediastinum/Nodes: Top-normal right hilar lymph node measures 11 mm. Subcarinal lymph node measures 14 mm in short axis. The esophagus is grossly unremarkable. No mediastinal fluid collection. Lungs/Pleura: There is an 11 mm right upper lobe nodule (35/1). This nodule is new since the prior CT and concerning for malignancy. A 6 mm left upper lobe nodule (43/1) also new compared to the prior CT. There is a 4 mm left upper lobe nodule (33/1). Several additional small nodules including a 4 mm subpleural nodule in the right lower lobe (90/1) and a 3 mm nodule in the right upper lobe (38/1). These nodules are new since the prior CT and concerning for malignancy or metastatic disease. No focal consolidation, pleural effusion, or pneumothorax. The central airways are patent. Musculoskeletal: There is a 15 mm lucent lesion with sclerotic margin in the T12, present on the prior CT. This lesion is  indeterminate. There is osteopenia. A small T5 sclerotic focus was present on the prior CT and may represent a bone island. No acute osseous pathology. 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 Review of the MIP images confirms the above findings. CTA ABDOMEN AND PELVIS FINDINGS VASCULAR Aorta: Moderate atherosclerotic calcification. No aneurysmal dilatation or dissection. No periaortic fluid collection. Celiac: Patent without evidence of aneurysm, dissection, vasculitis or significant stenosis. SMA: Patent without evidence of aneurysm, dissection, vasculitis or significant stenosis. Renals: Both renal arteries are patent without evidence of aneurysm, dissection, vasculitis, fibromuscular dysplasia or significant stenosis. IMA: Patent without evidence of aneurysm, dissection, vasculitis or significant stenosis. Inflow: Atherosclerotic calcification of the iliac  arteries. No aneurysmal dilatation or dissection. The iliac arteries are patent. Veins: The IVC is unremarkable. The SMV, and main portal vein are patent. No portal venous gas. Review of the MIP images confirms the above findings. NON-VASCULAR No intra-abdominal free air or free fluid. Hepatobiliary: The liver is unremarkable. No intrahepatic biliary dilatation. The gallbladder contains air likely related to recent procedure. Clinical correlation recommended. No calcified gallstone or pericholecystic fluid. There is mild dilatation of the central biliary trees. The common bile duct measures 12 mm in diameter. No calcified stone noted in the central CBD. Pancreas: There is an ill-defined hypodense lesion in the body of the pancreas measuring approximately 2.5 x 1.5 cm (36/6. This is not characterized but concerning for malignancy. Further characterization with MRI without and with contrast recommended. There is an ill-defined hypodense extra pancreatic nodular density posterior to the pancreas adjacent to the SMA likely extension of the pancreatic lesion. No active inflammatory changes of the pancreas. Spleen: Normal in size without focal abnormality. Adrenals/Urinary Tract: The adrenal glands unremarkable. Bilateral renal cortical scarring. Small right renal cysts measure up to 2 cm in the upper pole of the right kidney. There is no hydronephrosis on either side. The visualized ureters and urinary bladder appear unremarkable. Stomach/Bowel: There is severe sigmoid and diffuse colonic diverticulosis without active inflammatory changes. There is moderate stool throughout the colon. There is no bowel obstruction or active inflammation. The appendix is normal. Lymphatic: No adenopathy. Reproductive: Ill-defined posterior body uterine fibroids measure up to approximately 4 cm. No adnexal masses. Other: Right groin surgical clips. Musculoskeletal: Osteopenia with degenerative changes of the spine. No acute osseous  pathology. Review of the MIP images confirms the above findings. IMPRESSION: 1. No CT evidence of pulmonary embolism or aortic dissection. 2. Ill-defined hypodense lesion in the body of the pancreas concerning for malignancy. Further characterization with MRI without and with contrast recommended. 3. Multiple bilateral pulmonary nodules measure up to 11 mm in the right upper lobe concerning for malignancy or metastatic disease. Clinical correlation and multidisciplinary consult is advised. 4. Severe colonic diverticulosis. No bowel obstruction. Normal appendix. 5. Aortic Atherosclerosis (ICD10-I70.0). Electronically Signed   By: Anner Crete M.D.   On: 12/29/2019 22:39    Procedures .Critical Care Performed by: Gareth Morgan, MD Authorized by: Gareth Morgan, MD   Critical care provider statement:    Critical care time (minutes):  45   Critical care was time spent personally by me on the following activities:  Evaluation of patient's response to treatment, examination of patient, ordering and performing treatments and interventions, ordering and review of laboratory studies, ordering and review of radiographic studies, pulse oximetry, re-evaluation of patient's condition, obtaining history from patient or surrogate and review of old charts   (including critical care time)  Medications Ordered in ED Medications  diltiazem (CARDIZEM) 1 mg/mL load via infusion 10 mg (10 mg Intravenous Bolus from Bag 12/29/19 2301)    And  diltiazem (CARDIZEM) 125 mg in dextrose 5% 125 mL (1 mg/mL) infusion (5 mg/hr Intravenous Rate/Dose Verify 12/29/19 2330)  sodium chloride 0.9 % bolus 500 mL (0 mLs Intravenous Stopped 12/29/19 2309)  ondansetron (ZOFRAN) injection 4 mg (4 mg Intravenous Given 12/29/19 2140)  morphine 4 MG/ML injection 4 mg (4 mg Intravenous Given 12/29/19 2140)  iohexol (OMNIPAQUE) 350 MG/ML injection 100 mL (100 mLs Intravenous Contrast Given 12/29/19 2154)    ED Course  I have  reviewed the triage vital signs and the nursing notes.  Pertinent labs & imaging results that were available during my care of the patient were reviewed by me and considered in my medical decision making (see chart for details).    MDM Rules/Calculators/A&P                          69 year old female with history of COPD, coronary artery calcification, hypertension, hyperlipidemia, pseudotumor cerebri, vitamin B12 deficiency, who presents with concern for abdominal pain and found to have tachycardia/atrial flutter on PCP visit today.  Given recent travel, leg pain and swelling, new onset atrial flutter, CT PE study was done which showed no evidence of pulmonary embolism, but did show multiple bilateral pulmonary nodules.  Given abdominal pain with atrial fibrillation, CT angio abdomen pelvis was ordered which showed no evidence of mesenteric ischemia nor diverticulitis, but did show concern for abnormality of the pancreas.  Lipase is elevated on lab work, but not significant enough to be pancreatitis.  CHADS2Vasc 3.  Will initiate heparin drip.    Heart rate initially 135 on arrival.  Given bolus and gtt of diltiazem with some improvement but continued elevated rate to 110s.  Will admit for atrial flutter with RVR and also obtain MRI abdomen for further evaluation of possible malignancy.  Ordered DVT study of LLE to be done as inpatient.  Currently on 2L of O2 secondary to desaturation after receiving pain medication.  Final Clinical Impression(s) / ED Diagnoses Final diagnoses:  Atrial flutter, unspecified type [facility])  Pulmonary nodules  Pancreatic abnormality  Epigastric pain    Rx / DC Orders ED Discharge Orders    None       Gareth Morgan, MD 12/29/19 2352

## 2019-12-30 ENCOUNTER — Observation Stay (HOSPITAL_COMMUNITY): Payer: Medicare Other

## 2019-12-30 ENCOUNTER — Observation Stay (HOSPITAL_BASED_OUTPATIENT_CLINIC_OR_DEPARTMENT_OTHER): Payer: Medicare Other

## 2019-12-30 ENCOUNTER — Encounter (HOSPITAL_COMMUNITY): Payer: Self-pay | Admitting: Internal Medicine

## 2019-12-30 DIAGNOSIS — M79609 Pain in unspecified limb: Secondary | ICD-10-CM

## 2019-12-30 DIAGNOSIS — I4891 Unspecified atrial fibrillation: Secondary | ICD-10-CM | POA: Diagnosis present

## 2019-12-30 DIAGNOSIS — I10 Essential (primary) hypertension: Secondary | ICD-10-CM | POA: Diagnosis present

## 2019-12-30 DIAGNOSIS — G932 Benign intracranial hypertension: Secondary | ICD-10-CM | POA: Diagnosis present

## 2019-12-30 DIAGNOSIS — J9601 Acute respiratory failure with hypoxia: Secondary | ICD-10-CM | POA: Diagnosis present

## 2019-12-30 DIAGNOSIS — E782 Mixed hyperlipidemia: Secondary | ICD-10-CM | POA: Diagnosis present

## 2019-12-30 DIAGNOSIS — Z882 Allergy status to sulfonamides status: Secondary | ICD-10-CM | POA: Diagnosis not present

## 2019-12-30 DIAGNOSIS — Z7951 Long term (current) use of inhaled steroids: Secondary | ICD-10-CM | POA: Diagnosis not present

## 2019-12-30 DIAGNOSIS — K838 Other specified diseases of biliary tract: Secondary | ICD-10-CM | POA: Diagnosis present

## 2019-12-30 DIAGNOSIS — I4892 Unspecified atrial flutter: Secondary | ICD-10-CM

## 2019-12-30 DIAGNOSIS — R1013 Epigastric pain: Secondary | ICD-10-CM | POA: Diagnosis present

## 2019-12-30 DIAGNOSIS — I959 Hypotension, unspecified: Secondary | ICD-10-CM | POA: Diagnosis present

## 2019-12-30 DIAGNOSIS — J449 Chronic obstructive pulmonary disease, unspecified: Secondary | ICD-10-CM

## 2019-12-30 DIAGNOSIS — M1990 Unspecified osteoarthritis, unspecified site: Secondary | ICD-10-CM | POA: Diagnosis present

## 2019-12-30 DIAGNOSIS — Z20822 Contact with and (suspected) exposure to covid-19: Secondary | ICD-10-CM | POA: Diagnosis present

## 2019-12-30 DIAGNOSIS — F17210 Nicotine dependence, cigarettes, uncomplicated: Secondary | ICD-10-CM | POA: Diagnosis present

## 2019-12-30 DIAGNOSIS — Z881 Allergy status to other antibiotic agents status: Secondary | ICD-10-CM | POA: Diagnosis not present

## 2019-12-30 DIAGNOSIS — K869 Disease of pancreas, unspecified: Secondary | ICD-10-CM | POA: Diagnosis present

## 2019-12-30 DIAGNOSIS — R918 Other nonspecific abnormal finding of lung field: Secondary | ICD-10-CM

## 2019-12-30 DIAGNOSIS — R634 Abnormal weight loss: Secondary | ICD-10-CM | POA: Diagnosis present

## 2019-12-30 DIAGNOSIS — I8390 Asymptomatic varicose veins of unspecified lower extremity: Secondary | ICD-10-CM | POA: Diagnosis present

## 2019-12-30 DIAGNOSIS — Z79899 Other long term (current) drug therapy: Secondary | ICD-10-CM | POA: Diagnosis not present

## 2019-12-30 DIAGNOSIS — I2510 Atherosclerotic heart disease of native coronary artery without angina pectoris: Secondary | ICD-10-CM | POA: Diagnosis present

## 2019-12-30 DIAGNOSIS — D7589 Other specified diseases of blood and blood-forming organs: Secondary | ICD-10-CM | POA: Diagnosis present

## 2019-12-30 DIAGNOSIS — I872 Venous insufficiency (chronic) (peripheral): Secondary | ICD-10-CM | POA: Diagnosis present

## 2019-12-30 LAB — COMPREHENSIVE METABOLIC PANEL
ALT: 17 U/L (ref 0–44)
AST: 16 U/L (ref 15–41)
Albumin: 3.7 g/dL (ref 3.5–5.0)
Alkaline Phosphatase: 48 U/L (ref 38–126)
Anion gap: 9 (ref 5–15)
BUN: 11 mg/dL (ref 8–23)
CO2: 28 mmol/L (ref 22–32)
Calcium: 8.9 mg/dL (ref 8.9–10.3)
Chloride: 101 mmol/L (ref 98–111)
Creatinine, Ser: 0.78 mg/dL (ref 0.44–1.00)
GFR, Estimated: >60 mL/min (ref >60)
Glucose, Bld: 94 mg/dL (ref 70–99)
Potassium: 3.8 mmol/L (ref 3.5–5.1)
Sodium: 138 mmol/L (ref 135–145)
Total Bilirubin: 1 mg/dL (ref 0.3–1.2)
Total Protein: 6.1 g/dL — ABNORMAL LOW (ref 6.5–8.1)

## 2019-12-30 LAB — URINALYSIS, COMPLETE (UACMP) WITH MICROSCOPIC
Bacteria, UA: NONE SEEN
Bilirubin Urine: NEGATIVE
Glucose, UA: NEGATIVE mg/dL
Hgb urine dipstick: NEGATIVE
Ketones, ur: NEGATIVE mg/dL
Leukocytes,Ua: NEGATIVE
Nitrite: NEGATIVE
Protein, ur: NEGATIVE mg/dL
Specific Gravity, Urine: >1.046 — ABNORMAL HIGH (ref 1.005–1.030)
pH: 5 (ref 5.0–8.0)

## 2019-12-30 LAB — CBC WITH DIFFERENTIAL/PLATELET
Abs Immature Granulocytes: 0.02 10*3/uL (ref 0.00–0.07)
Basophils Absolute: 0.1 10*3/uL (ref 0.0–0.1)
Basophils Relative: 1 %
Eosinophils Absolute: 0.1 10*3/uL (ref 0.0–0.5)
Eosinophils Relative: 1 %
HCT: 44.7 % (ref 36.0–46.0)
Hemoglobin: 14.5 g/dL (ref 12.0–15.0)
Immature Granulocytes: 0 %
Lymphocytes Relative: 27 %
Lymphs Abs: 1.9 10*3/uL (ref 0.7–4.0)
MCH: 34 pg (ref 26.0–34.0)
MCHC: 32.4 g/dL (ref 30.0–36.0)
MCV: 104.7 fL — ABNORMAL HIGH (ref 80.0–100.0)
Monocytes Absolute: 0.9 10*3/uL (ref 0.1–1.0)
Monocytes Relative: 13 %
Neutro Abs: 4 10*3/uL (ref 1.7–7.7)
Neutrophils Relative %: 58 %
Platelets: 164 10*3/uL (ref 150–400)
RBC: 4.27 MIL/uL (ref 3.87–5.11)
RDW: 12.9 % (ref 11.5–15.5)
WBC: 7 10*3/uL (ref 4.0–10.5)
nRBC: 0 % (ref 0.0–0.2)

## 2019-12-30 LAB — TROPONIN I (HIGH SENSITIVITY): Troponin I (High Sensitivity): 7 ng/L (ref <18)

## 2019-12-30 LAB — RESPIRATORY PANEL BY RT PCR (FLU A&B, COVID)
Influenza A by PCR: NEGATIVE
Influenza B by PCR: NEGATIVE
SARS Coronavirus 2 by RT PCR: NEGATIVE

## 2019-12-30 LAB — LIPASE, BLOOD: Lipase: 105 U/L — ABNORMAL HIGH (ref 11–51)

## 2019-12-30 LAB — ECHOCARDIOGRAM COMPLETE
Height: 67 in
S' Lateral: 3.5 cm
Weight: 2272 oz

## 2019-12-30 LAB — TSH: TSH: 2.679 u[IU]/mL (ref 0.350–4.500)

## 2019-12-30 LAB — MAGNESIUM: Magnesium: 1.6 mg/dL — ABNORMAL LOW (ref 1.7–2.4)

## 2019-12-30 LAB — HIV ANTIBODY (ROUTINE TESTING W REFLEX): HIV Screen 4th Generation wRfx: NONREACTIVE

## 2019-12-30 MED ORDER — BENAZEPRIL HCL 20 MG PO TABS: 20.0000 mg | ORAL_TABLET | Freq: Every day | ORAL | 11 refills | Status: DC

## 2019-12-30 MED ORDER — MAGNESIUM SULFATE 2 GM/50ML IV SOLN
2.0000 g | Freq: Once | INTRAVENOUS | Status: AC
  Administered 2019-12-30: 2 g via INTRAVENOUS
  Filled 2019-12-30: qty 50

## 2019-12-30 MED ORDER — POTASSIUM CHLORIDE 20 MEQ PO PACK
20.0000 meq | PACK | Freq: Once | ORAL | Status: AC
  Administered 2019-12-30: 20 meq via ORAL
  Filled 2019-12-30: qty 1

## 2019-12-30 MED ORDER — DILTIAZEM HCL ER COATED BEADS 120 MG PO CP24
120.0000 mg | ORAL_CAPSULE | Freq: Every day | ORAL | Status: DC
  Filled 2019-12-30 (×2): qty 1

## 2019-12-30 MED ORDER — LACTATED RINGERS IV SOLN: INTRAVENOUS | Status: AC

## 2019-12-30 MED ORDER — DILTIAZEM HCL 30 MG PO TABS: 30.0000 mg | ORAL_TABLET | Freq: Three times a day (TID) | ORAL | Status: DC

## 2019-12-30 MED ORDER — IPRATROPIUM BROMIDE 0.02 % IN SOLN: 0.5000 mg | Freq: Four times a day (QID) | RESPIRATORY_TRACT | Status: DC | PRN

## 2019-12-30 MED ORDER — ACETAMINOPHEN 325 MG PO TABS
650.0000 mg | ORAL_TABLET | ORAL | Status: DC | PRN
  Administered 2019-12-31: 650 mg via ORAL
  Filled 2019-12-30: qty 2

## 2019-12-30 MED ORDER — LACTATED RINGERS IV BOLUS
500.0000 mL | Freq: Once | INTRAVENOUS | Status: AC
  Administered 2019-12-30: 500 mL via INTRAVENOUS

## 2019-12-30 MED ORDER — DILTIAZEM HCL ER COATED BEADS 120 MG PO CP24: 120.0000 mg | ORAL_CAPSULE | Freq: Every day | ORAL | 1 refills | Status: DC

## 2019-12-30 MED ORDER — EZETIMIBE 10 MG PO TABS
10.0000 mg | ORAL_TABLET | Freq: Every day | ORAL | Status: DC
  Administered 2019-12-30 – 2019-12-31 (×2): 10 mg via ORAL
  Filled 2019-12-30 (×2): qty 1

## 2019-12-30 MED ORDER — GADOBUTROL 1 MMOL/ML IV SOLN
6.2000 mL | Freq: Once | INTRAVENOUS | Status: AC | PRN
  Administered 2019-12-30: 6.2 mL via INTRAVENOUS

## 2019-12-30 MED ORDER — ENOXAPARIN SODIUM 80 MG/0.8ML ~~LOC~~ SOLN
65.0000 mg | Freq: Two times a day (BID) | SUBCUTANEOUS | Status: DC
  Administered 2019-12-30 – 2019-12-31 (×2): 65 mg via SUBCUTANEOUS
  Filled 2019-12-30: qty 0.8
  Filled 2019-12-30: qty 0.65
  Filled 2019-12-30: qty 0.8

## 2019-12-30 MED ORDER — APIXABAN 5 MG PO TABS: 5.0000 mg | ORAL_TABLET | Freq: Two times a day (BID) | ORAL | 1 refills | Status: DC

## 2019-12-30 MED ORDER — LORATADINE 10 MG PO TABS
10.0000 mg | ORAL_TABLET | Freq: Every day | ORAL | Status: DC
  Administered 2019-12-30 – 2019-12-31 (×2): 10 mg via ORAL
  Filled 2019-12-30 (×2): qty 1

## 2019-12-30 MED ORDER — MORPHINE SULFATE (PF) 4 MG/ML IV SOLN: 4.0000 mg | INTRAVENOUS | Status: DC | PRN

## 2019-12-30 MED ORDER — FLUTICASONE FUROATE-VILANTEROL 200-25 MCG/INH IN AEPB
1.0000 | INHALATION_SPRAY | Freq: Every day | RESPIRATORY_TRACT | Status: DC
  Filled 2019-12-30: qty 28

## 2019-12-30 MED ORDER — APIXABAN 5 MG PO TABS
5.0000 mg | ORAL_TABLET | Freq: Two times a day (BID) | ORAL | Status: DC
  Administered 2019-12-30 (×2): 5 mg via ORAL
  Filled 2019-12-30 (×2): qty 1

## 2019-12-30 MED ORDER — DILTIAZEM HCL 30 MG PO TABS
30.0000 mg | ORAL_TABLET | Freq: Three times a day (TID) | ORAL | Status: DC
  Administered 2019-12-30: 30 mg via ORAL
  Filled 2019-12-30 (×2): qty 1

## 2019-12-30 MED ORDER — DILTIAZEM HCL 30 MG PO TABS
30.0000 mg | ORAL_TABLET | Freq: Four times a day (QID) | ORAL | Status: DC
  Administered 2019-12-30 – 2019-12-31 (×4): 30 mg via ORAL
  Filled 2019-12-30 (×6): qty 1

## 2019-12-30 MED ORDER — ONDANSETRON HCL 4 MG PO TABS: 4.0000 mg | ORAL_TABLET | Freq: Three times a day (TID) | ORAL | 0 refills | Status: DC | PRN

## 2019-12-30 MED ORDER — FLUTICASONE PROPIONATE 50 MCG/ACT NA SUSP
1.0000 | Freq: Every day | NASAL | Status: DC
  Administered 2019-12-31: 1 via NASAL
  Filled 2019-12-30: qty 16

## 2019-12-30 MED ORDER — PROCHLORPERAZINE EDISYLATE 10 MG/2ML IJ SOLN
10.0000 mg | Freq: Four times a day (QID) | INTRAMUSCULAR | Status: DC | PRN
  Filled 2019-12-30: qty 2

## 2019-12-30 MED ORDER — MORPHINE SULFATE (PF) 2 MG/ML IV SOLN
2.0000 mg | INTRAVENOUS | Status: DC | PRN
  Administered 2019-12-30 – 2019-12-31 (×4): 2 mg via INTRAVENOUS
  Filled 2019-12-30 (×5): qty 1

## 2019-12-30 MED ORDER — LEVALBUTEROL HCL 0.63 MG/3ML IN NEBU: 0.6300 mg | INHALATION_SOLUTION | Freq: Four times a day (QID) | RESPIRATORY_TRACT | Status: DC | PRN

## 2019-12-30 NOTE — ED Notes (Signed)
Pt resting in bed comfortably with eyes closes, does not appear in distress.  Respirations are even and non-labored. Skin is warm, dry and intact.   Call light reach, denies further needs at this time.

## 2019-12-30 NOTE — ED Notes (Signed)
Ambulated pt in room, pt ambulated w/o assistance, tolerated well, 02=90% on room air during ambulation, HR=125bpm. MD notified.

## 2019-12-30 NOTE — Progress Notes (Addendum)
ANTICOAGULATION CONSULT NOTE - Initial Consult  Pharmacy Consult for enoxaparin Indication: a flutter  Allergies  Allergen Reactions  . Ciprofloxacin     Per patient  "swollen tongue and breathing problems"   . Sulfa Antibiotics     Per patient "swollen tongue and breathing problems"    Patient Measurements: Height: 5\' 7"  (170.2 cm) Weight: 64.4 kg (142 lb) IBW/kg (Calculated) : 61.6  Vital Signs: BP: 82/51 (10/19 1630) Pulse Rate: 102 (10/19 1630)  Labs: Recent Labs    12/29/19 1821 12/29/19 2138 12/29/19 2339 12/30/19 0500 12/30/19 0511  HGB 15.0  --   --  14.5  --   HCT 44.9  --   --  44.7  --   PLT 199  --   --  164  --   CREATININE 0.79  --   --   --  0.78  TROPONINIHS  --  7 7  --   --     Estimated Creatinine Clearance: 66.4 mL/min (by C-G formula based on SCr of 0.78 mg/dL).   Medical History: Past Medical History:  Diagnosis Date  . Anemia   . Arthritis   . COPD (chronic obstructive pulmonary disease) ([place])   . Coronary artery calcification   . GERD (gastroesophageal reflux disease)   . Hyperlipidemia   . Hypertension   . Lung nodules   . Osteopenia   . Pseudotumor cerebri    Assessment: 69 year old female presenting with aflutter with RVR to 140s and was found to have pancreatic nodule on CT scan. Pt was initially started on eliquis 5mg  BID. Last dose was given 10/19 @1044 . Pt now scheduled to have biopsy completed on Friday 10/22 and pharmacy is now consulted to dose enoxaparin  CBC WNL; Hgb 14.5; Plt 164  Goal of Therapy:  Monitor platelets by anticoagulation protocol: Yes   Plan:  enoxparin 1mg /kg (65mg ) q12h subcu starting at 2200 on 10/19 Monitor s/sx bleeding, CBC, renal function  Carolin Guernsey  PGY1 pharmacy resident 12/30/2019,6:13 PM

## 2019-12-30 NOTE — ED Notes (Signed)
Pt returned from MRI, Vascular tech at bedside for leg [place].

## 2019-12-30 NOTE — H&P (View-Only) (Signed)
Reason for Consult: Pancreatic mass and abdominal pain Referring Physician: [facility]  Celene Squibb Cassis HPI: This is a 69 year old female with a PMH of COPD, GERD, anemia, and HTN admitted for complaints of abdominal pain.  The pain started 3 days ago and it was severe in the epigastric region.  Her pain worsens with PO intake and it is associated with nausea and vomiting.  Her lipase was mildly elevated and a CT scan does show a 2.5 x 1.5 cm hypodense lesion in the body of the pancreas.  There is dilation of the CBD at 12 mm with intrahepatic biliary ductal dilation, which is mild.  This is consistent with her cholecystectomy state.  She does report an 11 lbs unintentional weight loss over the past couple of months.  Upon presentation to the hospital she was diagnosed with aflutter, which is currently controlled.  Past Medical History:  Diagnosis Date  . Anemia   . Arthritis   . COPD (chronic obstructive pulmonary disease) ([place])   . Coronary artery calcification   . GERD (gastroesophageal reflux disease)   . Hyperlipidemia   . Hypertension   . Lung nodules   . Osteopenia   . Pseudotumor cerebri     Past Surgical History:  Procedure Laterality Date  . CHOLECYSTECTOMY    . TUBAL LIGATION    . VARICOSE VEIN SURGERY     multiple    Family History  Problem Relation Age of Onset  . Varicose Veins Father   . Varicose Veins Sister     Social History:  reports that she has been smoking cigarettes. She has been smoking about 0.75 packs per day. She has never used smokeless tobacco. She reports current alcohol use. No history on file for drug use.  Allergies:  Allergies  Allergen Reactions  . Ciprofloxacin     Per patient  "swollen tongue and breathing problems"   . Sulfa Antibiotics     Per patient "swollen tongue and breathing problems"    Medications:  Scheduled: . diltiazem  120 mg Oral Daily  . ezetimibe  10 mg Oral Daily  . fluticasone  1 spray Each Nare Daily  .  fluticasone furoate-vilanterol  1 puff Inhalation Daily  . loratadine  10 mg Oral Daily   Continuous:   Results for orders placed or performed during the hospital encounter of 12/29/19 (from the past 24 hour(s))  Lipase, blood     Status: Abnormal   Collection Time: 12/29/19  6:21 PM  Result Value Ref Range   Lipase 132 (H) 11 - 51 U/L  Comprehensive metabolic panel     Status: Abnormal   Collection Time: 12/29/19  6:21 PM  Result Value Ref Range   Sodium 137 135 - 145 mmol/L   Potassium 3.7 3.5 - 5.1 mmol/L   Chloride 101 98 - 111 mmol/L   CO2 24 22 - 32 mmol/L   Glucose, Bld 81 70 - 99 mg/dL   BUN 15 8 - 23 mg/dL   Creatinine, Ser 0.79 0.44 - 1.00 mg/dL   Calcium 9.0 8.9 - 10.3 mg/dL   Total Protein 6.0 (L) 6.5 - 8.1 g/dL   Albumin 3.7 3.5 - 5.0 g/dL   AST 16 15 - 41 U/L   ALT 16 0 - 44 U/L   Alkaline Phosphatase 52 38 - 126 U/L   Total Bilirubin 1.1 0.3 - 1.2 mg/dL   GFR, Estimated >60 >60 mL/min   Anion gap 12 5 - 15  CBC     Status: Abnormal   Collection Time: 12/29/19  6:21 PM  Result Value Ref Range   WBC 8.0 4.0 - 10.5 K/uL   RBC 4.36 3.87 - 5.11 MIL/uL   Hemoglobin 15.0 12.0 - 15.0 g/dL   HCT 44.9 36 - 46 %   MCV 103.0 (H) 80.0 - 100.0 fL   MCH 34.4 (H) 26.0 - 34.0 pg   MCHC 33.4 30.0 - 36.0 g/dL   RDW 12.6 11.5 - 15.5 %   Platelets 199 150 - 400 K/uL   nRBC 0.0 0.0 - 0.2 %  Urinalysis, Routine w reflex microscopic Urine, Clean Catch     Status: None   Collection Time: 12/29/19  8:36 PM  Result Value Ref Range   Color, Urine YELLOW YELLOW   APPearance CLEAR CLEAR   Specific Gravity, Urine 1.010 1.005 - 1.030   pH 5.0 5.0 - 8.0   Glucose, UA NEGATIVE NEGATIVE mg/dL   Hgb urine dipstick NEGATIVE NEGATIVE   Bilirubin Urine NEGATIVE NEGATIVE   Ketones, ur NEGATIVE NEGATIVE mg/dL   Protein, ur NEGATIVE NEGATIVE mg/dL   Nitrite NEGATIVE NEGATIVE   Leukocytes,Ua NEGATIVE NEGATIVE  Troponin I (High Sensitivity)     Status: None   Collection Time: 12/29/19   9:38 PM  Result Value Ref Range   Troponin I (High Sensitivity) 7 <18 ng/L  Brain natriuretic peptide     Status: None   Collection Time: 12/29/19  9:38 PM  Result Value Ref Range   B Natriuretic Peptide 65.4 0.0 - 100.0 pg/mL  Respiratory Panel by RT PCR (Flu A&B, Covid) - Nasopharyngeal Swab     Status: None   Collection Time: 12/29/19 11:15 PM   Specimen: Nasopharyngeal Swab  Result Value Ref Range   SARS Coronavirus 2 by RT PCR NEGATIVE NEGATIVE   Influenza A by PCR NEGATIVE NEGATIVE   Influenza B by PCR NEGATIVE NEGATIVE  Troponin I (High Sensitivity)     Status: None   Collection Time: 12/29/19 11:39 PM  Result Value Ref Range   Troponin I (High Sensitivity) 7 <18 ng/L  Urinalysis, Complete w Microscopic     Status: Abnormal   Collection Time: 12/30/19 12:41 AM  Result Value Ref Range   Color, Urine YELLOW YELLOW   APPearance CLEAR CLEAR   Specific Gravity, Urine >1.046 (H) 1.005 - 1.030   pH 5.0 5.0 - 8.0   Glucose, UA NEGATIVE NEGATIVE mg/dL   Hgb urine dipstick NEGATIVE NEGATIVE   Bilirubin Urine NEGATIVE NEGATIVE   Ketones, ur NEGATIVE NEGATIVE mg/dL   Protein, ur NEGATIVE NEGATIVE mg/dL   Nitrite NEGATIVE NEGATIVE   Leukocytes,Ua NEGATIVE NEGATIVE   RBC / HPF 0-5 0 - 5 RBC/hpf   WBC, UA 0-5 0 - 5 WBC/hpf   Bacteria, UA NONE SEEN NONE SEEN   Squamous Epithelial / LPF 0-5 0 - 5   Hyaline Casts, UA PRESENT   CBC WITH DIFFERENTIAL     Status: Abnormal   Collection Time: 12/30/19  5:00 AM  Result Value Ref Range   WBC 7.0 4.0 - 10.5 K/uL   RBC 4.27 3.87 - 5.11 MIL/uL   Hemoglobin 14.5 12.0 - 15.0 g/dL   HCT 44.7 36 - 46 %   MCV 104.7 (H) 80.0 - 100.0 fL   MCH 34.0 26.0 - 34.0 pg   MCHC 32.4 30.0 - 36.0 g/dL   RDW 12.9 11.5 - 15.5 %   Platelets 164 150 - 400 K/uL   nRBC 0.0  0.0 - 0.2 %   Neutrophils Relative % 58 %   Neutro Abs 4.0 1.7 - 7.7 K/uL   Lymphocytes Relative 27 %   Lymphs Abs 1.9 0.7 - 4.0 K/uL   Monocytes Relative 13 %   Monocytes Absolute 0.9  0.1 - 1.0 K/uL   Eosinophils Relative 1 %   Eosinophils Absolute 0.1 0.0 - 0.5 K/uL   Basophils Relative 1 %   Basophils Absolute 0.1 0.0 - 0.1 K/uL   Immature Granulocytes 0 %   Abs Immature Granulocytes 0.02 0.00 - 0.07 K/uL  Magnesium     Status: Abnormal   Collection Time: 12/30/19  5:11 AM  Result Value Ref Range   Magnesium 1.6 (L) 1.7 - 2.4 mg/dL  HIV Antibody (routine testing w rflx)     Status: None   Collection Time: 12/30/19  5:11 AM  Result Value Ref Range   HIV Screen 4th Generation wRfx Non Reactive Non Reactive  Comprehensive metabolic panel     Status: Abnormal   Collection Time: 12/30/19  5:11 AM  Result Value Ref Range   Sodium 138 135 - 145 mmol/L   Potassium 3.8 3.5 - 5.1 mmol/L   Chloride 101 98 - 111 mmol/L   CO2 28 22 - 32 mmol/L   Glucose, Bld 94 70 - 99 mg/dL   BUN 11 8 - 23 mg/dL   Creatinine, Ser 0.78 0.44 - 1.00 mg/dL   Calcium 8.9 8.9 - 10.3 mg/dL   Total Protein 6.1 (L) 6.5 - 8.1 g/dL   Albumin 3.7 3.5 - 5.0 g/dL   AST 16 15 - 41 U/L   ALT 17 0 - 44 U/L   Alkaline Phosphatase 48 38 - 126 U/L   Total Bilirubin 1.0 0.3 - 1.2 mg/dL   GFR, Estimated >60 >60 mL/min   Anion gap 9 5 - 15  TSH     Status: None   Collection Time: 12/30/19  5:13 AM  Result Value Ref Range   TSH 2.679 0.350 - 4.500 uIU/mL  Lipase, blood     Status: Abnormal   Collection Time: 12/30/19  8:32 AM  Result Value Ref Range   Lipase 105 (H) 11 - 51 U/L     DG Chest 2 View  Result Date: 12/29/2019 CLINICAL DATA:  Tachycardia EXAM: CHEST - 2 VIEW COMPARISON:  06/06/2016, CT 12/04/2018 FINDINGS: Emphysematous disease and hyperinflation. No acute consolidation or pleural effusion. Stable cardiomediastinal silhouette with prominent central pulmonary arteries. Aortic atherosclerosis. No pneumothorax. IMPRESSION: No active cardiopulmonary disease. Emphysematous disease. Electronically Signed   By: Donavan Foil M.D.   On: 12/29/2019 18:39   CT Angio Chest PE W and/or Wo  Contrast  Result Date: 12/29/2019 CLINICAL DATA:  69 year old female with left upper quadrant abdominal pain. History of diverticulitis. Concern for mesenteric ischemia. EXAM: CT ANGIOGRAPHY CHEST, ABDOMEN AND PELVIS TECHNIQUE: Non-contrast CT of the chest was initially obtained. Multidetector CT imaging through the chest, abdomen and pelvis was performed using the standard protocol during bolus administration of intravenous contrast. Multiplanar reconstructed images and MIPs were obtained and reviewed to evaluate the vascular anatomy. CONTRAST:  171mL OMNIPAQUE IOHEXOL 350 MG/ML SOLN COMPARISON:  Chest radiograph dated 12/29/2019 and CT dated 11/21/2017. FINDINGS: Evaluation is limited due to streak artifact 0 caused by patient's arms. CTA CHEST FINDINGS Cardiovascular: There is no cardiomegaly or pericardial effusion. Three-vessel coronary vascular calcification. There is moderate atherosclerotic calcification of the thoracic aorta. No aneurysmal dilatation. Evaluation of the aorta is limited due to  suboptimal opacification and timing of the contrast. No pulmonary artery embolus identified. Mediastinum/Nodes: Top-normal right hilar lymph node measures 11 mm. Subcarinal lymph node measures 14 mm in short axis. The esophagus is grossly unremarkable. No mediastinal fluid collection. Lungs/Pleura: There is an 11 mm right upper lobe nodule (35/1). This nodule is new since the prior CT and concerning for malignancy. A 6 mm left upper lobe nodule (43/1) also new compared to the prior CT. There is a 4 mm left upper lobe nodule (33/1). Several additional small nodules including a 4 mm subpleural nodule in the right lower lobe (90/1) and a 3 mm nodule in the right upper lobe (38/1). These nodules are new since the prior CT and concerning for malignancy or metastatic disease. No focal consolidation, pleural effusion, or pneumothorax. The central airways are patent. Musculoskeletal: There is a 15 mm lucent lesion with  sclerotic margin in the T12, present on the prior CT. This lesion is indeterminate. There is osteopenia. A small T5 sclerotic focus was present on the prior CT and may represent a bone island. No acute osseous pathology. 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 Review of the MIP images confirms the above findings. CTA ABDOMEN AND PELVIS FINDINGS VASCULAR Aorta: Moderate atherosclerotic calcification. No aneurysmal dilatation or dissection. No periaortic fluid collection. Celiac: Patent without evidence of aneurysm, dissection, vasculitis or significant stenosis. SMA: Patent without evidence of aneurysm, dissection, vasculitis or significant stenosis. Renals: Both renal arteries are patent without evidence of aneurysm, dissection, vasculitis, fibromuscular dysplasia or significant stenosis. IMA: Patent without evidence of aneurysm, dissection, vasculitis or significant stenosis. Inflow: Atherosclerotic calcification of the iliac arteries. No aneurysmal dilatation or dissection. The iliac arteries are patent. Veins: The IVC is unremarkable. The SMV, and main portal vein are patent. No portal venous gas. Review of the MIP images confirms the above findings. NON-VASCULAR No intra-abdominal free air or free fluid. Hepatobiliary: The liver is unremarkable. No intrahepatic biliary dilatation. The gallbladder contains air likely related to recent procedure. Clinical correlation recommended. No calcified gallstone or pericholecystic fluid. There is mild dilatation of the central biliary trees. The common bile duct measures 12 mm in diameter. No calcified stone noted in the central CBD. Pancreas: There is an ill-defined hypodense lesion in the body of the pancreas measuring approximately 2.5 x 1.5 cm (36/6. This is not characterized but concerning for malignancy. Further characterization with MRI without and with contrast recommended. There is an ill-defined hypodense extra  pancreatic nodular density posterior to the pancreas adjacent to the SMA likely extension of the pancreatic lesion. No active inflammatory changes of the pancreas. Spleen: Normal in size without focal abnormality. Adrenals/Urinary Tract: The adrenal glands unremarkable. Bilateral renal cortical scarring. Small right renal cysts measure up to 2 cm in the upper pole of the right kidney. There is no hydronephrosis on either side. The visualized ureters and urinary bladder appear unremarkable. Stomach/Bowel: There is severe sigmoid and diffuse colonic diverticulosis without active inflammatory changes. There is moderate stool throughout the colon. There is no bowel obstruction or active inflammation. The appendix is normal. Lymphatic: No adenopathy. Reproductive: Ill-defined posterior body uterine fibroids measure up to approximately 4 cm. No adnexal masses. Other: Right groin surgical clips. Musculoskeletal: Osteopenia with degenerative changes of the spine. No acute osseous pathology. Review of the MIP images confirms the above findings. IMPRESSION: 1. No CT evidence of pulmonary embolism or aortic dissection. 2. Ill-defined hypodense lesion in the body of the pancreas concerning  for malignancy. Further characterization with MRI without and with contrast recommended. 3. Multiple bilateral pulmonary nodules measure up to 11 mm in the right upper lobe concerning for malignancy or metastatic disease. Clinical correlation and multidisciplinary consult is advised. 4. Severe colonic diverticulosis. No bowel obstruction. Normal appendix. 5. Aortic Atherosclerosis (ICD10-I70.0). Electronically Signed   By: Anner Crete M.D.   On: 12/29/2019 22:39   MR Abdomen W or Wo Contrast  Result Date: 12/30/2019 CLINICAL DATA:  Left upper quadrant abdominal pain. Possible infiltrating pancreatic mass in the body of the pancreas on CT scan of 01/29/2020. EXAM: MRI ABDOMEN WITHOUT AND WITH CONTRAST TECHNIQUE: Multiplanar  multisequence MR imaging of the abdomen was performed both before and after the administration of intravenous contrast. CONTRAST:  6.63mL GADAVIST GADOBUTROL 1 MMOL/ML IV SOLN COMPARISON:  01/29/2020 FINDINGS: Despite efforts by the technologist and patient, motion artifact is present on today's exam and could not be eliminated. This reduces exam sensitivity and specificity. Lower chest: Mild cardiomegaly. The patient has known pulmonary nodules which are not well seen on today's MRI. Hepatobiliary: 5 mm T2 hyperintense, T1 hyperintense lesion in the lateral segment left hepatic lobe on image 36 of series 17, not appreciably enhancing, probably a small complex cyst. Cholecystectomy. Common bile duct measures up to 1.2 cm in diameter, likely a physiologic response to cholecystectomy. Pancreas: Hypoenhancing 2.2 by 1.5 by 1.8 cm mass within and along the posteroinferior pancreatic body abutting the anterior margin of the superior mesenteric artery but not wrapping around the SMA. This lesion is mildly T2 hyperintense compared to the surrounding pancreatic parenchyma. There is anterior displacement of the dorsal pancreatic duct in the vicinity of the mass, and borderline prominence of the duct along with a 0.8 by 0.5 cm cystic lesion along the left lateral margin of the mass which may represent a dilated side duct or conceivably even a small adjacent IPMN. The right margin of mass is tangential to the edge of the portal vein and there is poor opacification splenic vein which is in the immediate vicinity of the mass and accordingly suspicious for splenic vein thrombosis. Spleen:  No splenomegaly or focal splenic lesion. Adrenals/Urinary Tract: Lobularity of the left adrenal gland without a discrete mass. Bilateral renal cysts. Mild scarring of the left kidney lower pole. Complex 1.1 by 0.7 cm Bosniak category 2 cyst of the right kidney upper pole on image 53 of series 17. Stomach/Bowel: Colonic diverticulosis.  Vascular/Lymphatic: As noted above, there is poor opacification of the splenic vein adjacent to the pancreatic mass, suspicious for localized occlusion. The mass is tangential to the anterior margin of the SMA and the left lateral margin of the portal venous confluence, without wrapping around either of the structures. Aortoiliac atherosclerotic vascular disease. Other:  No supplemental non-categorized findings. Musculoskeletal: Levoconvex thoracolumbar scoliosis with degenerative disc disease and intervertebral spurring. IMPRESSION: 1. Hypoenhancing 2.2 by 1.5 by 1.8 cm mass within and along the posteroinferior pancreatic body highly suspicious for pancreatic adenocarcinoma. The right margin of the mass is tangential to the anterior margin of the SMA and the left lateral margin of the portal venous confluence, with poor opacification of the splenic vein in the immediate vicinity of the mass, suspicious for localized occlusion. The mass is minimally tangential to the anterior margin of the SMA, and the left margin of the portal venous confluence. 2. No findings of metastatic disease in the abdomen. The patient has known pulmonary nodules which are not well seen on today's MRI. 3. Colonic diverticulosis.  4. Levoconvex thoracolumbar scoliosis with degenerative disc disease and intervertebral spurring. 5.  Aortic Atherosclerosis (ICD10-I70.0). Electronically Signed   By: Van Clines M.D.   On: 12/30/2019 10:26   VAS [place] LOWER EXTREMITY VENOUS (DVT) (ONLY [facility] & [facility])  Result Date: 12/30/2019  Lower Venous DVT Study Indications: Pain.  Comparison Study: No prior studies. Performing Technologist: Oliver Hum RVT  Examination Guidelines: A complete evaluation includes B-mode imaging, spectral Doppler, color Doppler, and power Doppler as needed of all accessible portions of each vessel. Bilateral testing is considered an integral part of a complete examination. Limited examinations for reoccurring indications  may be performed as noted. The reflux portion of the exam is performed with the patient in reverse Trendelenburg.  +-----+---------------+---------+-----------+----------+--------------+ RIGHTCompressibilityPhasicitySpontaneityPropertiesThrombus Aging +-----+---------------+---------+-----------+----------+--------------+ CFV  Full           Yes      Yes                                 +-----+---------------+---------+-----------+----------+--------------+   +---------+---------------+---------+-----------+----------+--------------+ LEFT     CompressibilityPhasicitySpontaneityPropertiesThrombus Aging +---------+---------------+---------+-----------+----------+--------------+ CFV      Full           Yes      Yes                                 +---------+---------------+---------+-----------+----------+--------------+ SFJ      Full                                                        +---------+---------------+---------+-----------+----------+--------------+ FV Prox  Full                                                        +---------+---------------+---------+-----------+----------+--------------+ FV Mid   Full                                                        +---------+---------------+---------+-----------+----------+--------------+ FV DistalFull                                                        +---------+---------------+---------+-----------+----------+--------------+ PFV      Full                                                        +---------+---------------+---------+-----------+----------+--------------+ POP      Full           Yes      Yes                                 +---------+---------------+---------+-----------+----------+--------------+  PTV      Full                                                        +---------+---------------+---------+-----------+----------+--------------+ PERO     Full                                                         +---------+---------------+---------+-----------+----------+--------------+ 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    Summary: RIGHT: - No evidence of common femoral vein obstruction.  LEFT: - There is no evidence of deep vein thrombosis in the lower extremity.  - No cystic structure found in the popliteal fossa. 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  *See table(s) above for measurements and observations.    Preliminary    CT Angio Abd/Pel W and/or Wo Contrast  Result Date: 12/29/2019 CLINICAL DATA:  69 year old female with left upper quadrant abdominal pain. History of diverticulitis. Concern for mesenteric ischemia. EXAM: CT ANGIOGRAPHY CHEST, ABDOMEN AND PELVIS TECHNIQUE: Non-contrast CT of the chest was initially obtained. Multidetector CT imaging through the chest, abdomen and pelvis was performed using the standard protocol during bolus administration of intravenous contrast. Multiplanar reconstructed images and MIPs were obtained and reviewed to evaluate the vascular anatomy. CONTRAST:  149mL OMNIPAQUE IOHEXOL 350 MG/ML SOLN COMPARISON:  Chest radiograph dated 12/29/2019 and CT dated 11/21/2017. FINDINGS: Evaluation is limited due to streak artifact 0 caused by patient's arms. CTA CHEST FINDINGS Cardiovascular: There is no cardiomegaly or pericardial effusion. Three-vessel coronary vascular calcification. There is moderate atherosclerotic calcification of the thoracic aorta. No aneurysmal dilatation. Evaluation of the aorta is limited due to suboptimal opacification and timing of the contrast. No pulmonary artery embolus identified. Mediastinum/Nodes: Top-normal right hilar lymph node measures 11 mm. Subcarinal lymph node measures 14 mm in short axis. The esophagus is grossly unremarkable. No mediastinal fluid collection. Lungs/Pleura: There is an 11 mm right  upper lobe nodule (35/1). This nodule is new since the prior CT and concerning for malignancy. A 6 mm left upper lobe nodule (43/1) also new compared to the prior CT. There is a 4 mm left upper lobe nodule (33/1). Several additional small nodules including a 4 mm subpleural nodule in the right lower lobe (90/1) and a 3 mm nodule in the right upper lobe (38/1). These nodules are new since the prior CT and concerning for malignancy or metastatic disease. No focal consolidation, pleural effusion, or pneumothorax. The central airways are patent. Musculoskeletal: There is a 15 mm lucent lesion with sclerotic margin in the T12, present on the prior CT. This lesion is indeterminate. There is osteopenia. A small T5 sclerotic focus was present on the prior CT and may represent a bone island. No acute osseous pathology. 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 Review of the MIP images confirms the above findings. CTA ABDOMEN AND PELVIS FINDINGS VASCULAR Aorta: Moderate atherosclerotic calcification. No aneurysmal dilatation or dissection. No periaortic fluid collection. Celiac: Patent without evidence of aneurysm, dissection, vasculitis or significant stenosis. SMA: Patent without evidence  of aneurysm, dissection, vasculitis or significant stenosis. Renals: Both renal arteries are patent without evidence of aneurysm, dissection, vasculitis, fibromuscular dysplasia or significant stenosis. IMA: Patent without evidence of aneurysm, dissection, vasculitis or significant stenosis. Inflow: Atherosclerotic calcification of the iliac arteries. No aneurysmal dilatation or dissection. The iliac arteries are patent. Veins: The IVC is unremarkable. The SMV, and main portal vein are patent. No portal venous gas. Review of the MIP images confirms the above findings. NON-VASCULAR No intra-abdominal free air or free fluid. Hepatobiliary: The liver is unremarkable. No intrahepatic biliary  dilatation. The gallbladder contains air likely related to recent procedure. Clinical correlation recommended. No calcified gallstone or pericholecystic fluid. There is mild dilatation of the central biliary trees. The common bile duct measures 12 mm in diameter. No calcified stone noted in the central CBD. Pancreas: There is an ill-defined hypodense lesion in the body of the pancreas measuring approximately 2.5 x 1.5 cm (36/6. This is not characterized but concerning for malignancy. Further characterization with MRI without and with contrast recommended. There is an ill-defined hypodense extra pancreatic nodular density posterior to the pancreas adjacent to the SMA likely extension of the pancreatic lesion. No active inflammatory changes of the pancreas. Spleen: Normal in size without focal abnormality. Adrenals/Urinary Tract: The adrenal glands unremarkable. Bilateral renal cortical scarring. Small right renal cysts measure up to 2 cm in the upper pole of the right kidney. There is no hydronephrosis on either side. The visualized ureters and urinary bladder appear unremarkable. Stomach/Bowel: There is severe sigmoid and diffuse colonic diverticulosis without active inflammatory changes. There is moderate stool throughout the colon. There is no bowel obstruction or active inflammation. The appendix is normal. Lymphatic: No adenopathy. Reproductive: Ill-defined posterior body uterine fibroids measure up to approximately 4 cm. No adnexal masses. Other: Right groin surgical clips. Musculoskeletal: Osteopenia with degenerative changes of the spine. No acute osseous pathology. Review of the MIP images confirms the above findings. IMPRESSION: 1. No CT evidence of pulmonary embolism or aortic dissection. 2. Ill-defined hypodense lesion in the body of the pancreas concerning for malignancy. Further characterization with MRI without and with contrast recommended. 3. Multiple bilateral pulmonary nodules measure up to 11 mm  in the right upper lobe concerning for malignancy or metastatic disease. Clinical correlation and multidisciplinary consult is advised. 4. Severe colonic diverticulosis. No bowel obstruction. Normal appendix. 5. Aortic Atherosclerosis (ICD10-I70.0). Electronically Signed   By: Anner Crete M.D.   On: 12/29/2019 22:39    ROS:  As stated above in the HPI otherwise negative.  Blood pressure 113/86, pulse 68, temperature 97.8 F (36.6 C), temperature source Oral, resp. rate 15, height 5\' 7"  (1.702 m), weight 64.4 kg, SpO2 (!) 87 %.    PE: Gen: NAD, Alert and Oriented HEENT:  [place]/AT, EOMI Neck: Supple, no LAD Lungs: CTA Bilaterally CV: RRR without M/G/R, tachycardic ABD: Soft, tender in the epigastrium with mild palpation, +BS Ext: No C/C/E  Assessment/Plan: 1) Pancreatic mass. 2) Epigastric abdominal pain. 3) Atrial flutter.   The MRI results were reviewed and discussed with the patient.  It confirms the CT scan findings of the pancreatic body mass.  Typically these pancreatic masses do not present with severe epigastric pain, nausea, vomiting, and diarrhea.  She was traveling with her husband extensively shortly before admission and she was eating on the road.  The plan is to perform an EUS with FNA this Friday.  The procedure can be performed in the hospital or as an outpatient.  Arrangements are being  made for this Friday.  Plan: 1) EUS with FNA Friday as an outpatient or inpatient. 2) Avoid anticoagulation. 3) Continue with pain control. 4) Check Ca19-9.  Lijah Bourque D 12/30/2019, 3:07 PM

## 2019-12-30 NOTE — ED Notes (Signed)
Holding heparin drip right now per MD Inda Merlin.

## 2019-12-30 NOTE — Progress Notes (Signed)
  Echocardiogram 2D Echocardiogram has been performed.  Rebecca Weaver 12/30/2019, 1:39 PM

## 2019-12-30 NOTE — Consult Note (Signed)
Reason for Consult: Pancreatic mass and abdominal pain Referring Physician: [facility]  Celene Squibb Kawa HPI: This is a 69 year old female with a PMH of COPD, GERD, anemia, and HTN admitted for complaints of abdominal pain.  The pain started 3 days ago and it was severe in the epigastric region.  Her pain worsens with PO intake and it is associated with nausea and vomiting.  Her lipase was mildly elevated and a CT scan does show a 2.5 x 1.5 cm hypodense lesion in the body of the pancreas.  There is dilation of the CBD at 12 mm with intrahepatic biliary ductal dilation, which is mild.  This is consistent with her cholecystectomy state.  She does report an 11 lbs unintentional weight loss over the past couple of months.  Upon presentation to the hospital she was diagnosed with aflutter, which is currently controlled.  Past Medical History:  Diagnosis Date  . Anemia   . Arthritis   . COPD (chronic obstructive pulmonary disease) (Garza-Sal[place] II)   . Coronary artery calcification   . GERD (gastroesophageal reflux disease)   . Hyperlipidemia   . Hypertension   . Lung nodules   . Osteopenia   . Pseudotumor cerebri     Past Surgical History:  Procedure Laterality Date  . CHOLECYSTECTOMY    . TUBAL LIGATION    . VARICOSE VEIN SURGERY     multiple    Family History  Problem Relation Age of Onset  . Varicose Veins Father   . Varicose Veins Sister     Social History:  reports that she has been smoking cigarettes. She has been smoking about 0.75 packs per day. She has never used smokeless tobacco. She reports current alcohol use. No history on file for drug use.  Allergies:  Allergies  Allergen Reactions  . Ciprofloxacin     Per patient  "swollen tongue and breathing problems"   . Sulfa Antibiotics     Per patient "swollen tongue and breathing problems"    Medications:  Scheduled: . diltiazem  120 mg Oral Daily  . ezetimibe  10 mg Oral Daily  . fluticasone  1 spray Each Nare Daily  .  fluticasone furoate-vilanterol  1 puff Inhalation Daily  . loratadine  10 mg Oral Daily   Continuous:   Results for orders placed or performed during the hospital encounter of 12/29/19 (from the past 24 hour(s))  Lipase, blood     Status: Abnormal   Collection Time: 12/29/19  6:21 PM  Result Value Ref Range   Lipase 132 (H) 11 - 51 U/L  Comprehensive metabolic panel     Status: Abnormal   Collection Time: 12/29/19  6:21 PM  Result Value Ref Range   Sodium 137 135 - 145 mmol/L   Potassium 3.7 3.5 - 5.1 mmol/L   Chloride 101 98 - 111 mmol/L   CO2 24 22 - 32 mmol/L   Glucose, Bld 81 70 - 99 mg/dL   BUN 15 8 - 23 mg/dL   Creatinine, Ser 0.79 0.44 - 1.00 mg/dL   Calcium 9.0 8.9 - 10.3 mg/dL   Total Protein 6.0 (L) 6.5 - 8.1 g/dL   Albumin 3.7 3.5 - 5.0 g/dL   AST 16 15 - 41 U/L   ALT 16 0 - 44 U/L   Alkaline Phosphatase 52 38 - 126 U/L   Total Bilirubin 1.1 0.3 - 1.2 mg/dL   GFR, Estimated >60 >60 mL/min   Anion gap 12 5 - 15  CBC     Status: Abnormal   Collection Time: 12/29/19  6:21 PM  Result Value Ref Range   WBC 8.0 4.0 - 10.5 K/uL   RBC 4.36 3.87 - 5.11 MIL/uL   Hemoglobin 15.0 12.0 - 15.0 g/dL   HCT 44.9 36 - 46 %   MCV 103.0 (H) 80.0 - 100.0 fL   MCH 34.4 (H) 26.0 - 34.0 pg   MCHC 33.4 30.0 - 36.0 g/dL   RDW 12.6 11.5 - 15.5 %   Platelets 199 150 - 400 K/uL   nRBC 0.0 0.0 - 0.2 %  Urinalysis, Routine w reflex microscopic Urine, Clean Catch     Status: None   Collection Time: 12/29/19  8:36 PM  Result Value Ref Range   Color, Urine YELLOW YELLOW   APPearance CLEAR CLEAR   Specific Gravity, Urine 1.010 1.005 - 1.030   pH 5.0 5.0 - 8.0   Glucose, UA NEGATIVE NEGATIVE mg/dL   Hgb urine dipstick NEGATIVE NEGATIVE   Bilirubin Urine NEGATIVE NEGATIVE   Ketones, ur NEGATIVE NEGATIVE mg/dL   Protein, ur NEGATIVE NEGATIVE mg/dL   Nitrite NEGATIVE NEGATIVE   Leukocytes,Ua NEGATIVE NEGATIVE  Troponin I (High Sensitivity)     Status: None   Collection Time: 12/29/19   9:38 PM  Result Value Ref Range   Troponin I (High Sensitivity) 7 <18 ng/L  Brain natriuretic peptide     Status: None   Collection Time: 12/29/19  9:38 PM  Result Value Ref Range   B Natriuretic Peptide 65.4 0.0 - 100.0 pg/mL  Respiratory Panel by RT PCR (Flu A&B, Covid) - Nasopharyngeal Swab     Status: None   Collection Time: 12/29/19 11:15 PM   Specimen: Nasopharyngeal Swab  Result Value Ref Range   SARS Coronavirus 2 by RT PCR NEGATIVE NEGATIVE   Influenza A by PCR NEGATIVE NEGATIVE   Influenza B by PCR NEGATIVE NEGATIVE  Troponin I (High Sensitivity)     Status: None   Collection Time: 12/29/19 11:39 PM  Result Value Ref Range   Troponin I (High Sensitivity) 7 <18 ng/L  Urinalysis, Complete w Microscopic     Status: Abnormal   Collection Time: 12/30/19 12:41 AM  Result Value Ref Range   Color, Urine YELLOW YELLOW   APPearance CLEAR CLEAR   Specific Gravity, Urine >1.046 (H) 1.005 - 1.030   pH 5.0 5.0 - 8.0   Glucose, UA NEGATIVE NEGATIVE mg/dL   Hgb urine dipstick NEGATIVE NEGATIVE   Bilirubin Urine NEGATIVE NEGATIVE   Ketones, ur NEGATIVE NEGATIVE mg/dL   Protein, ur NEGATIVE NEGATIVE mg/dL   Nitrite NEGATIVE NEGATIVE   Leukocytes,Ua NEGATIVE NEGATIVE   RBC / HPF 0-5 0 - 5 RBC/hpf   WBC, UA 0-5 0 - 5 WBC/hpf   Bacteria, UA NONE SEEN NONE SEEN   Squamous Epithelial / LPF 0-5 0 - 5   Hyaline Casts, UA PRESENT   CBC WITH DIFFERENTIAL     Status: Abnormal   Collection Time: 12/30/19  5:00 AM  Result Value Ref Range   WBC 7.0 4.0 - 10.5 K/uL   RBC 4.27 3.87 - 5.11 MIL/uL   Hemoglobin 14.5 12.0 - 15.0 g/dL   HCT 44.7 36 - 46 %   MCV 104.7 (H) 80.0 - 100.0 fL   MCH 34.0 26.0 - 34.0 pg   MCHC 32.4 30.0 - 36.0 g/dL   RDW 12.9 11.5 - 15.5 %   Platelets 164 150 - 400 K/uL   nRBC 0.0  0.0 - 0.2 %   Neutrophils Relative % 58 %   Neutro Abs 4.0 1.7 - 7.7 K/uL   Lymphocytes Relative 27 %   Lymphs Abs 1.9 0.7 - 4.0 K/uL   Monocytes Relative 13 %   Monocytes Absolute 0.9  0.1 - 1.0 K/uL   Eosinophils Relative 1 %   Eosinophils Absolute 0.1 0.0 - 0.5 K/uL   Basophils Relative 1 %   Basophils Absolute 0.1 0.0 - 0.1 K/uL   Immature Granulocytes 0 %   Abs Immature Granulocytes 0.02 0.00 - 0.07 K/uL  Magnesium     Status: Abnormal   Collection Time: 12/30/19  5:11 AM  Result Value Ref Range   Magnesium 1.6 (L) 1.7 - 2.4 mg/dL  HIV Antibody (routine testing w rflx)     Status: None   Collection Time: 12/30/19  5:11 AM  Result Value Ref Range   HIV Screen 4th Generation wRfx Non Reactive Non Reactive  Comprehensive metabolic panel     Status: Abnormal   Collection Time: 12/30/19  5:11 AM  Result Value Ref Range   Sodium 138 135 - 145 mmol/L   Potassium 3.8 3.5 - 5.1 mmol/L   Chloride 101 98 - 111 mmol/L   CO2 28 22 - 32 mmol/L   Glucose, Bld 94 70 - 99 mg/dL   BUN 11 8 - 23 mg/dL   Creatinine, Ser 0.78 0.44 - 1.00 mg/dL   Calcium 8.9 8.9 - 10.3 mg/dL   Total Protein 6.1 (L) 6.5 - 8.1 g/dL   Albumin 3.7 3.5 - 5.0 g/dL   AST 16 15 - 41 U/L   ALT 17 0 - 44 U/L   Alkaline Phosphatase 48 38 - 126 U/L   Total Bilirubin 1.0 0.3 - 1.2 mg/dL   GFR, Estimated >60 >60 mL/min   Anion gap 9 5 - 15  TSH     Status: None   Collection Time: 12/30/19  5:13 AM  Result Value Ref Range   TSH 2.679 0.350 - 4.500 uIU/mL  Lipase, blood     Status: Abnormal   Collection Time: 12/30/19  8:32 AM  Result Value Ref Range   Lipase 105 (H) 11 - 51 U/L     DG Chest 2 View  Result Date: 12/29/2019 CLINICAL DATA:  Tachycardia EXAM: CHEST - 2 VIEW COMPARISON:  06/06/2016, CT 12/04/2018 FINDINGS: Emphysematous disease and hyperinflation. No acute consolidation or pleural effusion. Stable cardiomediastinal silhouette with prominent central pulmonary arteries. Aortic atherosclerosis. No pneumothorax. IMPRESSION: No active cardiopulmonary disease. Emphysematous disease. Electronically Signed   By: Donavan Foil M.D.   On: 12/29/2019 18:39   CT Angio Chest PE W and/or Wo  Contrast  Result Date: 12/29/2019 CLINICAL DATA:  69 year old female with left upper quadrant abdominal pain. History of diverticulitis. Concern for mesenteric ischemia. EXAM: CT ANGIOGRAPHY CHEST, ABDOMEN AND PELVIS TECHNIQUE: Non-contrast CT of the chest was initially obtained. Multidetector CT imaging through the chest, abdomen and pelvis was performed using the standard protocol during bolus administration of intravenous contrast. Multiplanar reconstructed images and MIPs were obtained and reviewed to evaluate the vascular anatomy. CONTRAST:  149mL OMNIPAQUE IOHEXOL 350 MG/ML SOLN COMPARISON:  Chest radiograph dated 12/29/2019 and CT dated 11/21/2017. FINDINGS: Evaluation is limited due to streak artifact 0 caused by patient's arms. CTA CHEST FINDINGS Cardiovascular: There is no cardiomegaly or pericardial effusion. Three-vessel coronary vascular calcification. There is moderate atherosclerotic calcification of the thoracic aorta. No aneurysmal dilatation. Evaluation of the aorta is limited due to  suboptimal opacification and timing of the contrast. No pulmonary artery embolus identified. Mediastinum/Nodes: Top-normal right hilar lymph node measures 11 mm. Subcarinal lymph node measures 14 mm in short axis. The esophagus is grossly unremarkable. No mediastinal fluid collection. Lungs/Pleura: There is an 11 mm right upper lobe nodule (35/1). This nodule is new since the prior CT and concerning for malignancy. A 6 mm left upper lobe nodule (43/1) also new compared to the prior CT. There is a 4 mm left upper lobe nodule (33/1). Several additional small nodules including a 4 mm subpleural nodule in the right lower lobe (90/1) and a 3 mm nodule in the right upper lobe (38/1). These nodules are new since the prior CT and concerning for malignancy or metastatic disease. No focal consolidation, pleural effusion, or pneumothorax. The central airways are patent. Musculoskeletal: There is a 15 mm lucent lesion with  sclerotic margin in the T12, present on the prior CT. This lesion is indeterminate. There is osteopenia. A small T5 sclerotic focus was present on the prior CT and may represent a bone island. No acute osseous pathology. 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 Review of the MIP images confirms the above findings. CTA ABDOMEN AND PELVIS FINDINGS VASCULAR Aorta: Moderate atherosclerotic calcification. No aneurysmal dilatation or dissection. No periaortic fluid collection. Celiac: Patent without evidence of aneurysm, dissection, vasculitis or significant stenosis. SMA: Patent without evidence of aneurysm, dissection, vasculitis or significant stenosis. Renals: Both renal arteries are patent without evidence of aneurysm, dissection, vasculitis, fibromuscular dysplasia or significant stenosis. IMA: Patent without evidence of aneurysm, dissection, vasculitis or significant stenosis. Inflow: Atherosclerotic calcification of the iliac arteries. No aneurysmal dilatation or dissection. The iliac arteries are patent. Veins: The IVC is unremarkable. The SMV, and main portal vein are patent. No portal venous gas. Review of the MIP images confirms the above findings. NON-VASCULAR No intra-abdominal free air or free fluid. Hepatobiliary: The liver is unremarkable. No intrahepatic biliary dilatation. The gallbladder contains air likely related to recent procedure. Clinical correlation recommended. No calcified gallstone or pericholecystic fluid. There is mild dilatation of the central biliary trees. The common bile duct measures 12 mm in diameter. No calcified stone noted in the central CBD. Pancreas: There is an ill-defined hypodense lesion in the body of the pancreas measuring approximately 2.5 x 1.5 cm (36/6. This is not characterized but concerning for malignancy. Further characterization with MRI without and with contrast recommended. There is an ill-defined hypodense extra  pancreatic nodular density posterior to the pancreas adjacent to the SMA likely extension of the pancreatic lesion. No active inflammatory changes of the pancreas. Spleen: Normal in size without focal abnormality. Adrenals/Urinary Tract: The adrenal glands unremarkable. Bilateral renal cortical scarring. Small right renal cysts measure up to 2 cm in the upper pole of the right kidney. There is no hydronephrosis on either side. The visualized ureters and urinary bladder appear unremarkable. Stomach/Bowel: There is severe sigmoid and diffuse colonic diverticulosis without active inflammatory changes. There is moderate stool throughout the colon. There is no bowel obstruction or active inflammation. The appendix is normal. Lymphatic: No adenopathy. Reproductive: Ill-defined posterior body uterine fibroids measure up to approximately 4 cm. No adnexal masses. Other: Right groin surgical clips. Musculoskeletal: Osteopenia with degenerative changes of the spine. No acute osseous pathology. Review of the MIP images confirms the above findings. IMPRESSION: 1. No CT evidence of pulmonary embolism or aortic dissection. 2. Ill-defined hypodense lesion in the body of the pancreas concerning  for malignancy. Further characterization with MRI without and with contrast recommended. 3. Multiple bilateral pulmonary nodules measure up to 11 mm in the right upper lobe concerning for malignancy or metastatic disease. Clinical correlation and multidisciplinary consult is advised. 4. Severe colonic diverticulosis. No bowel obstruction. Normal appendix. 5. Aortic Atherosclerosis (ICD10-I70.0). Electronically Signed   By: Anner Crete M.D.   On: 12/29/2019 22:39   MR Abdomen W or Wo Contrast  Result Date: 12/30/2019 CLINICAL DATA:  Left upper quadrant abdominal pain. Possible infiltrating pancreatic mass in the body of the pancreas on CT scan of 01/29/2020. EXAM: MRI ABDOMEN WITHOUT AND WITH CONTRAST TECHNIQUE: Multiplanar  multisequence MR imaging of the abdomen was performed both before and after the administration of intravenous contrast. CONTRAST:  6.69mL GADAVIST GADOBUTROL 1 MMOL/ML IV SOLN COMPARISON:  01/29/2020 FINDINGS: Despite efforts by the technologist and patient, motion artifact is present on today's exam and could not be eliminated. This reduces exam sensitivity and specificity. Lower chest: Mild cardiomegaly. The patient has known pulmonary nodules which are not well seen on today's MRI. Hepatobiliary: 5 mm T2 hyperintense, T1 hyperintense lesion in the lateral segment left hepatic lobe on image 36 of series 17, not appreciably enhancing, probably a small complex cyst. Cholecystectomy. Common bile duct measures up to 1.2 cm in diameter, likely a physiologic response to cholecystectomy. Pancreas: Hypoenhancing 2.2 by 1.5 by 1.8 cm mass within and along the posteroinferior pancreatic body abutting the anterior margin of the superior mesenteric artery but not wrapping around the SMA. This lesion is mildly T2 hyperintense compared to the surrounding pancreatic parenchyma. There is anterior displacement of the dorsal pancreatic duct in the vicinity of the mass, and borderline prominence of the duct along with a 0.8 by 0.5 cm cystic lesion along the left lateral margin of the mass which may represent a dilated side duct or conceivably even a small adjacent IPMN. The right margin of mass is tangential to the edge of the portal vein and there is poor opacification splenic vein which is in the immediate vicinity of the mass and accordingly suspicious for splenic vein thrombosis. Spleen:  No splenomegaly or focal splenic lesion. Adrenals/Urinary Tract: Lobularity of the left adrenal gland without a discrete mass. Bilateral renal cysts. Mild scarring of the left kidney lower pole. Complex 1.1 by 0.7 cm Bosniak category 2 cyst of the right kidney upper pole on image 53 of series 17. Stomach/Bowel: Colonic diverticulosis.  Vascular/Lymphatic: As noted above, there is poor opacification of the splenic vein adjacent to the pancreatic mass, suspicious for localized occlusion. The mass is tangential to the anterior margin of the SMA and the left lateral margin of the portal venous confluence, without wrapping around either of the structures. Aortoiliac atherosclerotic vascular disease. Other:  No supplemental non-categorized findings. Musculoskeletal: Levoconvex thoracolumbar scoliosis with degenerative disc disease and intervertebral spurring. IMPRESSION: 1. Hypoenhancing 2.2 by 1.5 by 1.8 cm mass within and along the posteroinferior pancreatic body highly suspicious for pancreatic adenocarcinoma. The right margin of the mass is tangential to the anterior margin of the SMA and the left lateral margin of the portal venous confluence, with poor opacification of the splenic vein in the immediate vicinity of the mass, suspicious for localized occlusion. The mass is minimally tangential to the anterior margin of the SMA, and the left margin of the portal venous confluence. 2. No findings of metastatic disease in the abdomen. The patient has known pulmonary nodules which are not well seen on today's MRI. 3. Colonic diverticulosis.  4. Levoconvex thoracolumbar scoliosis with degenerative disc disease and intervertebral spurring. 5.  Aortic Atherosclerosis (ICD10-I70.0). Electronically Signed   By: Van Clines M.D.   On: 12/30/2019 10:26   VAS [place] LOWER EXTREMITY VENOUS (DVT) (ONLY [facility] & [facility])  Result Date: 12/30/2019  Lower Venous DVT Study Indications: Pain.  Comparison Study: No prior studies. Performing Technologist: Oliver Hum RVT  Examination Guidelines: A complete evaluation includes B-mode imaging, spectral Doppler, color Doppler, and power Doppler as needed of all accessible portions of each vessel. Bilateral testing is considered an integral part of a complete examination. Limited examinations for reoccurring indications  may be performed as noted. The reflux portion of the exam is performed with the patient in reverse Trendelenburg.  +-----+---------------+---------+-----------+----------+--------------+ RIGHTCompressibilityPhasicitySpontaneityPropertiesThrombus Aging +-----+---------------+---------+-----------+----------+--------------+ CFV  Full           Yes      Yes                                 +-----+---------------+---------+-----------+----------+--------------+   +---------+---------------+---------+-----------+----------+--------------+ LEFT     CompressibilityPhasicitySpontaneityPropertiesThrombus Aging +---------+---------------+---------+-----------+----------+--------------+ CFV      Full           Yes      Yes                                 +---------+---------------+---------+-----------+----------+--------------+ SFJ      Full                                                        +---------+---------------+---------+-----------+----------+--------------+ FV Prox  Full                                                        +---------+---------------+---------+-----------+----------+--------------+ FV Mid   Full                                                        +---------+---------------+---------+-----------+----------+--------------+ FV DistalFull                                                        +---------+---------------+---------+-----------+----------+--------------+ PFV      Full                                                        +---------+---------------+---------+-----------+----------+--------------+ POP      Full           Yes      Yes                                 +---------+---------------+---------+-----------+----------+--------------+  PTV      Full                                                        +---------+---------------+---------+-----------+----------+--------------+ PERO     Full                                                         +---------+---------------+---------+-----------+----------+--------------+ 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    Summary: RIGHT: - No evidence of common femoral vein obstruction.  LEFT: - There is no evidence of deep vein thrombosis in the lower extremity.  - No cystic structure found in the popliteal fossa. 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  *See table(s) above for measurements and observations.    Preliminary    CT Angio Abd/Pel W and/or Wo Contrast  Result Date: 12/29/2019 CLINICAL DATA:  69 year old female with left upper quadrant abdominal pain. History of diverticulitis. Concern for mesenteric ischemia. EXAM: CT ANGIOGRAPHY CHEST, ABDOMEN AND PELVIS TECHNIQUE: Non-contrast CT of the chest was initially obtained. Multidetector CT imaging through the chest, abdomen and pelvis was performed using the standard protocol during bolus administration of intravenous contrast. Multiplanar reconstructed images and MIPs were obtained and reviewed to evaluate the vascular anatomy. CONTRAST:  170mL OMNIPAQUE IOHEXOL 350 MG/ML SOLN COMPARISON:  Chest radiograph dated 12/29/2019 and CT dated 11/21/2017. FINDINGS: Evaluation is limited due to streak artifact 0 caused by patient's arms. CTA CHEST FINDINGS Cardiovascular: There is no cardiomegaly or pericardial effusion. Three-vessel coronary vascular calcification. There is moderate atherosclerotic calcification of the thoracic aorta. No aneurysmal dilatation. Evaluation of the aorta is limited due to suboptimal opacification and timing of the contrast. No pulmonary artery embolus identified. Mediastinum/Nodes: Top-normal right hilar lymph node measures 11 mm. Subcarinal lymph node measures 14 mm in short axis. The esophagus is grossly unremarkable. No mediastinal fluid collection. Lungs/Pleura: There is an 11 mm right  upper lobe nodule (35/1). This nodule is new since the prior CT and concerning for malignancy. A 6 mm left upper lobe nodule (43/1) also new compared to the prior CT. There is a 4 mm left upper lobe nodule (33/1). Several additional small nodules including a 4 mm subpleural nodule in the right lower lobe (90/1) and a 3 mm nodule in the right upper lobe (38/1). These nodules are new since the prior CT and concerning for malignancy or metastatic disease. No focal consolidation, pleural effusion, or pneumothorax. The central airways are patent. Musculoskeletal: There is a 15 mm lucent lesion with sclerotic margin in the T12, present on the prior CT. This lesion is indeterminate. There is osteopenia. A small T5 sclerotic focus was present on the prior CT and may represent a bone island. No acute osseous pathology. 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 Review of the MIP images confirms the above findings. CTA ABDOMEN AND PELVIS FINDINGS VASCULAR Aorta: Moderate atherosclerotic calcification. No aneurysmal dilatation or dissection. No periaortic fluid collection. Celiac: Patent without evidence of aneurysm, dissection, vasculitis or significant stenosis. SMA: Patent without evidence  of aneurysm, dissection, vasculitis or significant stenosis. Renals: Both renal arteries are patent without evidence of aneurysm, dissection, vasculitis, fibromuscular dysplasia or significant stenosis. IMA: Patent without evidence of aneurysm, dissection, vasculitis or significant stenosis. Inflow: Atherosclerotic calcification of the iliac arteries. No aneurysmal dilatation or dissection. The iliac arteries are patent. Veins: The IVC is unremarkable. The SMV, and main portal vein are patent. No portal venous gas. Review of the MIP images confirms the above findings. NON-VASCULAR No intra-abdominal free air or free fluid. Hepatobiliary: The liver is unremarkable. No intrahepatic biliary  dilatation. The gallbladder contains air likely related to recent procedure. Clinical correlation recommended. No calcified gallstone or pericholecystic fluid. There is mild dilatation of the central biliary trees. The common bile duct measures 12 mm in diameter. No calcified stone noted in the central CBD. Pancreas: There is an ill-defined hypodense lesion in the body of the pancreas measuring approximately 2.5 x 1.5 cm (36/6. This is not characterized but concerning for malignancy. Further characterization with MRI without and with contrast recommended. There is an ill-defined hypodense extra pancreatic nodular density posterior to the pancreas adjacent to the SMA likely extension of the pancreatic lesion. No active inflammatory changes of the pancreas. Spleen: Normal in size without focal abnormality. Adrenals/Urinary Tract: The adrenal glands unremarkable. Bilateral renal cortical scarring. Small right renal cysts measure up to 2 cm in the upper pole of the right kidney. There is no hydronephrosis on either side. The visualized ureters and urinary bladder appear unremarkable. Stomach/Bowel: There is severe sigmoid and diffuse colonic diverticulosis without active inflammatory changes. There is moderate stool throughout the colon. There is no bowel obstruction or active inflammation. The appendix is normal. Lymphatic: No adenopathy. Reproductive: Ill-defined posterior body uterine fibroids measure up to approximately 4 cm. No adnexal masses. Other: Right groin surgical clips. Musculoskeletal: Osteopenia with degenerative changes of the spine. No acute osseous pathology. Review of the MIP images confirms the above findings. IMPRESSION: 1. No CT evidence of pulmonary embolism or aortic dissection. 2. Ill-defined hypodense lesion in the body of the pancreas concerning for malignancy. Further characterization with MRI without and with contrast recommended. 3. Multiple bilateral pulmonary nodules measure up to 11 mm  in the right upper lobe concerning for malignancy or metastatic disease. Clinical correlation and multidisciplinary consult is advised. 4. Severe colonic diverticulosis. No bowel obstruction. Normal appendix. 5. Aortic Atherosclerosis (ICD10-I70.0). Electronically Signed   By: Anner Crete M.D.   On: 12/29/2019 22:39    ROS:  As stated above in the HPI otherwise negative.  Blood pressure 113/86, pulse 68, temperature 97.8 F (36.6 C), temperature source Oral, resp. rate 15, height 5\' 7"  (1.702 m), weight 64.4 kg, SpO2 (!) 87 %.    PE: Gen: NAD, Alert and Oriented HEENT:  [place]/AT, EOMI Neck: Supple, no LAD Lungs: CTA Bilaterally CV: RRR without M/G/R, tachycardic ABD: Soft, tender in the epigastrium with mild palpation, +BS Ext: No C/C/E  Assessment/Plan: 1) Pancreatic mass. 2) Epigastric abdominal pain. 3) Atrial flutter.   The MRI results were reviewed and discussed with the patient.  It confirms the CT scan findings of the pancreatic body mass.  Typically these pancreatic masses do not present with severe epigastric pain, nausea, vomiting, and diarrhea.  She was traveling with her husband extensively shortly before admission and she was eating on the road.  The plan is to perform an EUS with FNA this Friday.  The procedure can be performed in the hospital or as an outpatient.  Arrangements are being  made for this Friday.  Plan: 1) EUS with FNA Friday as an outpatient or inpatient. 2) Avoid anticoagulation. 3) Continue with pain control. 4) Check Ca19-9.  Lanesha Azzaro D 12/30/2019, 3:07 PM

## 2019-12-30 NOTE — ED Notes (Signed)
Pt transported to MRI 

## 2019-12-30 NOTE — Progress Notes (Signed)
PROGRESS NOTE  Rebecca Weaver CHY:[id number] DOB: [birth year]   PCP: Nickola Major, MD  Patient is from: Home  DOA: 12/29/2019 LOS: 0  Chief complaints: Fast heart rate  Brief Narrative / Interim history: 69 year old female with history of COPD not on oxygen, pulmonary nodules, HTN, GERD, hyperlipidemia and tobacco use disorder presenting from PCP office with new onset atrial flutter.  Patient presented to PCP with 3 days of abdominal pain, nausea, vomiting, diarrhea and about 11 pound weight loss, and found to be in a flutter.  She was sent to ED for evaluation.  In ED, she was found to be in a flutter with RVR to 140s.  CMP within normal.  Lipase 132.  CBC within normal except for MCV of 103.  EKG confirmed atrial flutter.  CXR without acute finding but emphysematous change.  BNP and serial troponin negative.  Influenza PCR and COVID-19 PCR negative.  UA without significant finding.  CT chest/abdomen/pelvis revealed by lateral pulmonary nodules with the largest one in RUL measuring about 11 mm,  pancreatic lesion concerning for malignancy and CBD to 12 mm.  MRI abdomen, echocardiogram and  lower extremity venous Doppler ordered.  Started on Cardizem drip and Eliquis and admitted.  The next day, MRI abdomen confirmed hyperenhancing 2.2 x 1.5 x 1.8 cm mass within and along the posterior inferior pancreatic body highly suspicious for pancreatic adenocarcinoma without finding to suggest abdominal metastasis.  [facility] consulted and will do EUS with FNA on Friday either inpatient or outpatient.  In regards to atrial flutter.  Heart rate improved and transitioned to p.o. Cardizem.  However, she went back in mild RVR with HR in 110s to 120s, and BP dropped to 82/51.  Echocardiogram with EF of 50 to 55% and severe LAE.  Discussed with cardiology, Dr. Sallyanne Kuster.    Subjective: Seen and examined earlier this morning and later this afternoon.  HR was in 60s to 70s earlier this morning but up to  110s later this afternoon.  Also low blood pressure.  She appears to be saturating in upper 80s to lower 90s as well.  Nausea and vomiting improved.  Still with some abdominal pain and tenderness this afternoon.  Objective: Vitals:   12/30/19 1530 12/30/19 1545 12/30/19 1600 12/30/19 1630  BP: 100/72 103/72 103/73 (!) 82/51  Pulse:    (!) 102  Resp: 15 16 15 16   Temp:      TempSrc:      SpO2:    90%  Weight:      Height:        Intake/Output Summary (Last 24 hours) at 12/30/2019 1806 Last data filed at 12/30/2019 1121 Gross per 24 hour  Intake 50 ml  Output --  Net 50 ml   Filed Weights   12/29/19 2110  Weight: 64.4 kg    Examination:  GENERAL: No apparent distress.  Nontoxic. HEENT: MMM.  Vision and hearing grossly intact.  NECK: Supple.  No apparent JVD.  RESP: 90% on RA.  No IWOB.  Fair aeration bilaterally. CVS:  RRR. Heart sounds normal.  ABD/GI/GU: BS+. Abd soft.  Tenderness over mid upper abdomen MSK/EXT:  Moves extremities. No apparent deformity. No edema.  SKIN: no apparent skin lesion or wound NEURO: Awake, alert and oriented appropriately.  No apparent focal neuro deficit. PSYCH: Calm. Normal affect.  Procedures:  None  Microbiology summarized: COVID-19 PCR negative. Influenza PCR negative.  Assessment & Plan: New onset atrial flutter with RVR: transitioned to p.o. Cardizem  earlier this morning when HR was in 60s to 70s. Now HR in 110s and low BP to 82/51 this evening.  HR went up to 125 with ambulation.  Echocardiogram with EF of 50 to 55% and severe LAE.  TSH within normal. -Continue p.o. Cardizem 30 mg every 6 hours -Hold other home antihypertensive medications -Gentle IV fluid to support blood pressure -Hold Eliquis in anticipation for pancreatic biopsy on Friday inpatient or outpatient. -Lovenox for pharmacy for anticoagulation -Discussed with cardiology, Dr. Sallyanne Kuster. If no improvement, will get formal consult  Pancreatic mass: a 2.2 x 1.5 x  1.8 cm mass within and along the posterior inferior pancreatic body noted on MRI and CT.  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  No finding to suggest abdominal metastasis. -GI consulted-plan for EUS FNA on Friday either inpatient or outpatient. -CA 19-9  Nausea/vomiting/abdominal pain/weight loss-likely due pancreatic lesion.  Nausea and vomiting improved.  Still with abdominal tenderness on exam.  Lipase improved -IV fluid, antiemetics and pain meds.  Hypotension in patient with essential hypertension -IV fluid support as above  Acute hypoxemic respiratory failure/chronic COPD-intermittent desaturation to upper 80s.  Saturating at 90% at rest on room air.  Not on oxygen at home.  CXR reassuring.  CTA chest negative for PE but pulmonary nodules.  Troponin and BNP within normal.  Echo reassuring.  Does not appear to be in COPD exacerbation. -Continue home Breo Ellipta -Add as needed Atrovent and Xopenex -Incentive spirometry.  Tobacco use disorder: About 47-pack-year history.  Cut down to 10 cigarettes a day about 2 years ago. -Encouraged cessation -Continue nicotine patch.  Macrocytosis without anemia: Could be from chronic COPD/hypoxia -Vitamin L87 and folic acid level  Dyslipidemia -Continue home Zetia  Unintentional weight loss: She reports about 11 pound weight loss recently.  Definitely lost about 22 pounds in the last 4 years. Wt Readings from Last 10 Encounters:  12/29/19 64.4 kg  04/21/15 74.3 kg   Body mass index is 22.24 kg/m.         DVT prophylaxis:  On full dose Lovenox for anticoagulation  Code Status: Full code Family Communication: Updated patient's husband at bedside. Status is: Observation  Remains inpatient appropriate because:Hemodynamically unstable, Unsafe d/c plan, IV treatments appropriate due to intensity of illness or inability to take PO and Inpatient level of care appropriate due to severity of illness   Dispo: The patient is from: Home               Anticipated d/c is to: Home              Anticipated d/c date is: 1 day              Patient currently is not medically stable to d/c.       Consultants:  [facility] over the phone   Sch Meds:  Scheduled Meds: . diltiazem  30 mg Oral Q6H  . ezetimibe  10 mg Oral Daily  . fluticasone  1 spray Each Nare Daily  . fluticasone furoate-vilanterol  1 puff Inhalation Daily  . loratadine  10 mg Oral Daily   Continuous Infusions: . lactated ringers     PRN Meds:.acetaminophen, ipratropium, levalbuterol, morphine injection **OR** [DISCONTINUED]  morphine injection, prochlorperazine  Antimicrobials: Anti-infectives (From admission, onward)   None       I have personally reviewed the following labs and images: CBC: Recent Labs  Lab 12/29/19 1821 12/30/19 0500  WBC 8.0 7.0  NEUTROABS  --  4.0  HGB 15.0 14.5  HCT 44.9 44.7  MCV 103.0* 104.7*  PLT 199 164   BMP &GFR Recent Labs  Lab 12/29/19 1821 12/30/19 0511  NA 137 138  K 3.7 3.8  CL 101 101  CO2 24 28  GLUCOSE 81 94  BUN 15 11  CREATININE 0.79 0.78  CALCIUM 9.0 8.9  MG  --  1.6*   Estimated Creatinine Clearance: 66.4 mL/min (by C-G formula based on SCr of 0.78 mg/dL). Liver & Pancreas: Recent Labs  Lab 12/29/19 1821 12/30/19 0511  AST 16 16  ALT 16 17  ALKPHOS 52 48  BILITOT 1.1 1.0  PROT 6.0* 6.1*  ALBUMIN 3.7 3.7   Recent Labs  Lab 12/29/19 1821 12/30/19 0832  LIPASE 132* 105*   No results for input(s): AMMONIA in the last 168 hours. Diabetic: No results for input(s): HGBA1C in the last 72 hours. No results for input(s): GLUCAP in the last 168 hours. Cardiac Enzymes: No results for input(s): CKTOTAL, CKMB, CKMBINDEX, TROPONINI in the last 168 hours. No results for input(s): PROBNP in the last 8760 hours. Coagulation Profile: No results for input(s): INR, PROTIME in the last 168 hours. Thyroid Function Tests: Recent Labs    12/30/19 0513  TSH 2.679   Lipid  Profile: No results for input(s): CHOL, HDL, LDLCALC, TRIG, CHOLHDL, LDLDIRECT in the last 72 hours. Anemia Panel: No results for input(s): VITAMINB12, FOLATE, FERRITIN, TIBC, IRON, RETICCTPCT in the last 72 hours. Urine analysis:    Component Value Date/Time   COLORURINE YELLOW 12/30/2019 0041   APPEARANCEUR CLEAR 12/30/2019 0041   LABSPEC >1.046 (H) 12/30/2019 0041   PHURINE 5.0 12/30/2019 0041   GLUCOSEU NEGATIVE 12/30/2019 0041   HGBUR NEGATIVE 12/30/2019 0041   BILIRUBINUR NEGATIVE 12/30/2019 0041   KETONESUR NEGATIVE 12/30/2019 0041   PROTEINUR NEGATIVE 12/30/2019 0041   NITRITE NEGATIVE 12/30/2019 0041   LEUKOCYTESUR NEGATIVE 12/30/2019 0041   Sepsis Labs: Invalid input(s): PROCALCITONIN, [place]  Microbiology: Recent Results (from the past 240 hour(s))  Respiratory Panel by RT PCR (Flu A&B, Covid) - Nasopharyngeal Swab     Status: None   Collection Time: 12/29/19 11:15 PM   Specimen: Nasopharyngeal Swab  Result Value Ref Range Status   SARS Coronavirus 2 by RT PCR NEGATIVE NEGATIVE Final    Comment: (NOTE) SARS-CoV-2 target nucleic acids are NOT DETECTED.  The SARS-CoV-2 RNA is generally detectable in upper respiratoy specimens during the acute phase of infection. The lowest concentration of SARS-CoV-2 viral copies this assay can detect is 131 copies/mL. A negative result does not preclude SARS-Cov-2 infection and should not be used as the sole basis for treatment or other patient management decisions. A negative result may occur with  improper specimen collection/handling, submission of specimen other than nasopharyngeal swab, presence of viral mutation(s) within the areas targeted by this assay, and inadequate number of viral copies (<131 copies/mL). A negative result must be combined with clinical observations, patient history, and epidemiological information. The expected result is Negative.  Fact Sheet for Patients:    PinkCheek.be  Fact Sheet for Healthcare Providers:  GravelBags.it  This test is no t yet approved or cleared by the [place] FDA and  has been authorized for detection and/or diagnosis of SARS-CoV-2 by FDA under an Emergency Use Authorization (EUA). This EUA will remain  in effect (meaning this test can be used) for the duration of the COVID-19 declaration under Section 564(b)(1) of the Act, 21 U.S.C. section 360bbb-3(b)(1), unless the authorization is terminated or revoked  sooner.     Influenza A by PCR NEGATIVE NEGATIVE Final   Influenza B by PCR NEGATIVE NEGATIVE Final    Comment: (NOTE) The Xpert Xpress SARS-CoV-2/FLU/RSV assay is intended as an aid in  the diagnosis of influenza from Nasopharyngeal swab specimens and  should not be used as a sole basis for treatment. Nasal washings and  aspirates are unacceptable for Xpert Xpress SARS-CoV-2/FLU/RSV  testing.  Fact Sheet for Patients: PinkCheek.be  Fact Sheet for Healthcare Providers: GravelBags.it  This test is not yet approved or cleared by the [place] FDA and  has been authorized for detection and/or diagnosis of SARS-CoV-2 by  FDA under an Emergency Use Authorization (EUA). This EUA will remain  in effect (meaning this test can be used) for the duration of the  Covid-19 declaration under Section 564(b)(1) of the Act, 21  U.S.C. section 360bbb-3(b)(1), unless the authorization is  terminated or revoked. Performed at [facility], [place] [address] [place], [place] [address]     Radiology Studies: DG Chest 2 View  Result Date: 12/29/2019 CLINICAL DATA:  Tachycardia EXAM: CHEST - 2 VIEW COMPARISON:  06/06/2016, CT 12/04/2018 FINDINGS: Emphysematous disease and hyperinflation. No acute consolidation or pleural effusion. Stable cardiomediastinal silhouette with prominent central pulmonary  arteries. Aortic atherosclerosis. No pneumothorax. IMPRESSION: No active cardiopulmonary disease. Emphysematous disease. Electronically Signed   By: Donavan Foil M.D.   On: 12/29/2019 18:39   CT Angio Chest PE W and/or Wo Contrast  Result Date: 12/29/2019 CLINICAL DATA:  69 year old female with left upper quadrant abdominal pain. History of diverticulitis. Concern for mesenteric ischemia. EXAM: CT ANGIOGRAPHY CHEST, ABDOMEN AND PELVIS TECHNIQUE: Non-contrast CT of the chest was initially obtained. Multidetector CT imaging through the chest, abdomen and pelvis was performed using the standard protocol during bolus administration of intravenous contrast. Multiplanar reconstructed images and MIPs were obtained and reviewed to evaluate the vascular anatomy. CONTRAST:  165mL OMNIPAQUE IOHEXOL 350 MG/ML SOLN COMPARISON:  Chest radiograph dated 12/29/2019 and CT dated 11/21/2017. FINDINGS: Evaluation is limited due to streak artifact 0 caused by patient's arms. CTA CHEST FINDINGS Cardiovascular: There is no cardiomegaly or pericardial effusion. Three-vessel coronary vascular calcification. There is moderate atherosclerotic calcification of the thoracic aorta. No aneurysmal dilatation. Evaluation of the aorta is limited due to suboptimal opacification and timing of the contrast. No pulmonary artery embolus identified. Mediastinum/Nodes: Top-normal right hilar lymph node measures 11 mm. Subcarinal lymph node measures 14 mm in short axis. The esophagus is grossly unremarkable. No mediastinal fluid collection. Lungs/Pleura: There is an 11 mm right upper lobe nodule (35/1). This nodule is new since the prior CT and concerning for malignancy. A 6 mm left upper lobe nodule (43/1) also new compared to the prior CT. There is a 4 mm left upper lobe nodule (33/1). Several additional small nodules including a 4 mm subpleural nodule in the right lower lobe (90/1) and a 3 mm nodule in the right upper lobe (38/1). These nodules are  new since the prior CT and concerning for malignancy or metastatic disease. No focal consolidation, pleural effusion, or pneumothorax. The central airways are patent. Musculoskeletal: There is a 15 mm lucent lesion with sclerotic margin in the T12, present on the prior CT. This lesion is indeterminate. There is osteopenia. A small T5 sclerotic focus was present on the prior CT and may represent a bone island. No acute osseous pathology. 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 Review of the MIP  images confirms the above findings. CTA ABDOMEN AND PELVIS FINDINGS VASCULAR Aorta: Moderate atherosclerotic calcification. No aneurysmal dilatation or dissection. No periaortic fluid collection. Celiac: Patent without evidence of aneurysm, dissection, vasculitis or significant stenosis. SMA: Patent without evidence of aneurysm, dissection, vasculitis or significant stenosis. Renals: Both renal arteries are patent without evidence of aneurysm, dissection, vasculitis, fibromuscular dysplasia or significant stenosis. IMA: Patent without evidence of aneurysm, dissection, vasculitis or significant stenosis. Inflow: Atherosclerotic calcification of the iliac arteries. No aneurysmal dilatation or dissection. The iliac arteries are patent. Veins: The IVC is unremarkable. The SMV, and main portal vein are patent. No portal venous gas. Review of the MIP images confirms the above findings. NON-VASCULAR No intra-abdominal free air or free fluid. Hepatobiliary: The liver is unremarkable. No intrahepatic biliary dilatation. The gallbladder contains air likely related to recent procedure. Clinical correlation recommended. No calcified gallstone or pericholecystic fluid. There is mild dilatation of the central biliary trees. The common bile duct measures 12 mm in diameter. No calcified stone noted in the central CBD. Pancreas: There is an ill-defined hypodense lesion in the body of the pancreas  measuring approximately 2.5 x 1.5 cm (36/6. This is not characterized but concerning for malignancy. Further characterization with MRI without and with contrast recommended. There is an ill-defined hypodense extra pancreatic nodular density posterior to the pancreas adjacent to the SMA likely extension of the pancreatic lesion. No active inflammatory changes of the pancreas. Spleen: Normal in size without focal abnormality. Adrenals/Urinary Tract: The adrenal glands unremarkable. Bilateral renal cortical scarring. Small right renal cysts measure up to 2 cm in the upper pole of the right kidney. There is no hydronephrosis on either side. The visualized ureters and urinary bladder appear unremarkable. Stomach/Bowel: There is severe sigmoid and diffuse colonic diverticulosis without active inflammatory changes. There is moderate stool throughout the colon. There is no bowel obstruction or active inflammation. The appendix is normal. Lymphatic: No adenopathy. Reproductive: Ill-defined posterior body uterine fibroids measure up to approximately 4 cm. No adnexal masses. Other: Right groin surgical clips. Musculoskeletal: Osteopenia with degenerative changes of the spine. No acute osseous pathology. Review of the MIP images confirms the above findings. IMPRESSION: 1. No CT evidence of pulmonary embolism or aortic dissection. 2. Ill-defined hypodense lesion in the body of the pancreas concerning for malignancy. Further characterization with MRI without and with contrast recommended. 3. Multiple bilateral pulmonary nodules measure up to 11 mm in the right upper lobe concerning for malignancy or metastatic disease. Clinical correlation and multidisciplinary consult is advised. 4. Severe colonic diverticulosis. No bowel obstruction. Normal appendix. 5. Aortic Atherosclerosis (ICD10-I70.0). Electronically Signed   By: Anner Crete M.D.   On: 12/29/2019 22:39   MR Abdomen W or Wo Contrast  Result Date:  12/30/2019 CLINICAL DATA:  Left upper quadrant abdominal pain. Possible infiltrating pancreatic mass in the body of the pancreas on CT scan of 01/29/2020. EXAM: MRI ABDOMEN WITHOUT AND WITH CONTRAST TECHNIQUE: Multiplanar multisequence MR imaging of the abdomen was performed both before and after the administration of intravenous contrast. CONTRAST:  6.71mL GADAVIST GADOBUTROL 1 MMOL/ML IV SOLN COMPARISON:  01/29/2020 FINDINGS: Despite efforts by the technologist and patient, motion artifact is present on today's exam and could not be eliminated. This reduces exam sensitivity and specificity. Lower chest: Mild cardiomegaly. The patient has known pulmonary nodules which are not well seen on today's MRI. Hepatobiliary: 5 mm T2 hyperintense, T1 hyperintense lesion in the lateral segment left hepatic lobe on image 36 of series 17, not appreciably  enhancing, probably a small complex cyst. Cholecystectomy. Common bile duct measures up to 1.2 cm in diameter, likely a physiologic response to cholecystectomy. Pancreas: Hypoenhancing 2.2 by 1.5 by 1.8 cm mass within and along the posteroinferior pancreatic body abutting the anterior margin of the superior mesenteric artery but not wrapping around the SMA. This lesion is mildly T2 hyperintense compared to the surrounding pancreatic parenchyma. There is anterior displacement of the dorsal pancreatic duct in the vicinity of the mass, and borderline prominence of the duct along with a 0.8 by 0.5 cm cystic lesion along the left lateral margin of the mass which may represent a dilated side duct or conceivably even a small adjacent IPMN. The right margin of mass is tangential to the edge of the portal vein and there is poor opacification splenic vein which is in the immediate vicinity of the mass and accordingly suspicious for splenic vein thrombosis. Spleen:  No splenomegaly or focal splenic lesion. Adrenals/Urinary Tract: Lobularity of the left adrenal gland without a discrete  mass. Bilateral renal cysts. Mild scarring of the left kidney lower pole. Complex 1.1 by 0.7 cm Bosniak category 2 cyst of the right kidney upper pole on image 53 of series 17. Stomach/Bowel: Colonic diverticulosis. Vascular/Lymphatic: As noted above, there is poor opacification of the splenic vein adjacent to the pancreatic mass, suspicious for localized occlusion. The mass is tangential to the anterior margin of the SMA and the left lateral margin of the portal venous confluence, without wrapping around either of the structures. Aortoiliac atherosclerotic vascular disease. Other:  No supplemental non-categorized findings. Musculoskeletal: Levoconvex thoracolumbar scoliosis with degenerative disc disease and intervertebral spurring. IMPRESSION: 1. Hypoenhancing 2.2 by 1.5 by 1.8 cm mass within and along the posteroinferior pancreatic body highly suspicious for pancreatic adenocarcinoma. The right margin of the mass is tangential to the anterior margin of the SMA and the left lateral margin of the portal venous confluence, with poor opacification of the splenic vein in the immediate vicinity of the mass, suspicious for localized occlusion. The mass is minimally tangential to the anterior margin of the SMA, and the left margin of the portal venous confluence. 2. No findings of metastatic disease in the abdomen. The patient has known pulmonary nodules which are not well seen on today's MRI. 3. Colonic diverticulosis. 4. Levoconvex thoracolumbar scoliosis with degenerative disc disease and intervertebral spurring. 5.  Aortic Atherosclerosis (ICD10-I70.0). Electronically Signed   By: Van Clines M.D.   On: 12/30/2019 10:26   ECHOCARDIOGRAM COMPLETE  Result Date: 12/30/2019    ECHOCARDIOGRAM REPORT   Patient Name:   [place] [name] [facility] Date of Exam: 12/30/2019 Medical Rec #:  [id number]       Height:       67.0 in Accession #:    [id number]      Weight:       142.0 lb Date of Birth:  Mar 30, 1950        BSA:           1.748 m Patient Age:    82 years        BP:           113/86 mmHg Patient Gender: F               HR:           85 bpm. Exam Location:  Inpatient Procedure: 2D Echo Indications:    atrial flutter 427.32  History:        Patient has no prior history of  Echocardiogram examinations.                 COPD, Arrythmias:Atrial Fibrillation and Atrial Flutter; Risk                 Factors:Hypertension and Dyslipidemia.  Sonographer:    Johny Chess Referring Phys: [id number] Vernelle Emerald  Sonographer Comments: Image acquisition challenging due to respiratory motion. IMPRESSIONS  1. Left ventricular ejection fraction, by estimation, is 50 to 55%. The left ventricle has low normal function. The left ventricle has no regional wall motion abnormalities. Left ventricular diastolic parameters are indeterminate.  2. Right ventricular systolic function is low normal. The right ventricular size is mildly enlarged. Mildly increased right ventricular wall thickness.  3. Left atrial size was severely dilated.  4. The mitral valve is grossly normal. Trivial mitral valve regurgitation.  5. The aortic valve is tricuspid. Aortic valve regurgitation is not visualized.  6. The inferior vena cava is dilated in size with <50% respiratory variability, suggesting right atrial pressure of 15 mmHg. Comparison(s): No prior Echocardiogram. FINDINGS  Left Ventricle: Left ventricular ejection fraction, by estimation, is 50 to 55%. The left ventricle has low normal function. The left ventricle has no regional wall motion abnormalities. The left ventricular internal cavity size was normal in size. There is borderline left ventricular hypertrophy. Left ventricular diastolic parameters are indeterminate. Right Ventricle: The right ventricular size is mildly enlarged. Mildly increased right ventricular wall thickness. Right ventricular systolic function is low normal. Left Atrium: Left atrial size was severely dilated. Right Atrium: Right atrial  size was normal in size. Pericardium: There is no evidence of pericardial effusion. Mitral Valve: The mitral valve is grossly normal. Trivial mitral valve regurgitation. Tricuspid Valve: The tricuspid valve is grossly normal. Tricuspid valve regurgitation is trivial. Aortic Valve: The aortic valve is tricuspid. Aortic valve regurgitation is not visualized. Pulmonic Valve: The pulmonic valve was not well visualized. Pulmonic valve regurgitation is not visualized. Aorta: The aortic root and ascending aorta are structurally normal, with no evidence of dilitation. Venous: The pulmonary veins were not well visualized. The inferior vena cava is dilated in size with less than 50% respiratory variability, suggesting right atrial pressure of 15 mmHg. IAS/Shunts: No atrial level shunt detected by color flow Doppler.  LEFT VENTRICLE PLAX 2D LVIDd:         5.00 cm LVIDs:         3.50 cm LV PW:         1.10 cm LV IVS:        1.00 cm LVOT diam:     2.10 cm LV SV:         48 LV SV Index:   28 LVOT Area:     3.46 cm  RIGHT VENTRICLE             IVC RV S prime:     10.00 cm/s  IVC diam: 2.30 cm TAPSE (M-mode): 1.8 cm LEFT ATRIUM             Index       RIGHT ATRIUM           Index LA diam:        4.30 cm 2.46 cm/m  RA Area:     13.40 cm LA Vol (A2C):   58.5 ml 33.46 ml/m RA Volume:   28.50 ml  16.30 ml/m LA Vol (A4C):   79.9 ml 45.70 ml/m LA Biplane Vol: 74.1 ml 42.38 ml/m  AORTIC VALVE LVOT  Vmax:   81.70 cm/s LVOT Vmean:  51.100 cm/s LVOT VTI:    0.140 m  AORTA Ao Root diam: 3.00 cm Ao Asc diam:  2.90 cm  SHUNTS Systemic VTI:  0.14 m Systemic Diam: 2.10 cm Rudean Haskell MD Electronically signed by Rudean Haskell MD Signature Date/Time: 12/30/2019/3:50:11 PM    Final    VAS [place] LOWER EXTREMITY VENOUS (DVT) (ONLY [facility] & [facility])  Result Date: 12/30/2019  Lower Venous DVT Study Indications: Pain.  Comparison Study: No prior studies. Performing Technologist: Oliver Hum RVT  Examination Guidelines: A complete  evaluation includes B-mode imaging, spectral Doppler, color Doppler, and power Doppler as needed of all accessible portions of each vessel. Bilateral testing is considered an integral part of a complete examination. Limited examinations for reoccurring indications may be performed as noted. The reflux portion of the exam is performed with the patient in reverse Trendelenburg.  +-----+---------------+---------+-----------+----------+--------------+ RIGHTCompressibilityPhasicitySpontaneityPropertiesThrombus Aging +-----+---------------+---------+-----------+----------+--------------+ CFV  Full           Yes      Yes                                 +-----+---------------+---------+-----------+----------+--------------+   +---------+---------------+---------+-----------+----------+--------------+ LEFT     CompressibilityPhasicitySpontaneityPropertiesThrombus Aging +---------+---------------+---------+-----------+----------+--------------+ CFV      Full           Yes      Yes                                 +---------+---------------+---------+-----------+----------+--------------+ SFJ      Full                                                        +---------+---------------+---------+-----------+----------+--------------+ FV Prox  Full                                                        +---------+---------------+---------+-----------+----------+--------------+ FV Mid   Full                                                        +---------+---------------+---------+-----------+----------+--------------+ FV DistalFull                                                        +---------+---------------+---------+-----------+----------+--------------+ PFV      Full                                                        +---------+---------------+---------+-----------+----------+--------------+ POP      Full  Yes      Yes                                  +---------+---------------+---------+-----------+----------+--------------+ PTV      Full                                                        +---------+---------------+---------+-----------+----------+--------------+ PERO     Full                                                        +---------+---------------+---------+-----------+----------+--------------+ 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    Summary: RIGHT: - No evidence of common femoral vein obstruction.  LEFT: - There is no evidence of deep vein thrombosis in the lower extremity.  - No cystic structure found in the popliteal fossa. 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  *See table(s) above for measurements and observations. Electronically signed by Curt Jews MD on 12/30/2019 at 4:27:28 PM.    Final    CT Angio Abd/Pel W and/or Wo Contrast  Result Date: 12/29/2019 CLINICAL DATA:  69 year old female with left upper quadrant abdominal pain. History of diverticulitis. Concern for mesenteric ischemia. EXAM: CT ANGIOGRAPHY CHEST, ABDOMEN AND PELVIS TECHNIQUE: Non-contrast CT of the chest was initially obtained. Multidetector CT imaging through the chest, abdomen and pelvis was performed using the standard protocol during bolus administration of intravenous contrast. Multiplanar reconstructed images and MIPs were obtained and reviewed to evaluate the vascular anatomy. CONTRAST:  19mL OMNIPAQUE IOHEXOL 350 MG/ML SOLN COMPARISON:  Chest radiograph dated 12/29/2019 and CT dated 11/21/2017. FINDINGS: Evaluation is limited due to streak artifact 0 caused by patient's arms. CTA CHEST FINDINGS Cardiovascular: There is no cardiomegaly or pericardial effusion. Three-vessel coronary vascular calcification. There is moderate atherosclerotic calcification of the thoracic aorta. No aneurysmal dilatation. Evaluation of the aorta is limited due  to suboptimal opacification and timing of the contrast. No pulmonary artery embolus identified. Mediastinum/Nodes: Top-normal right hilar lymph node measures 11 mm. Subcarinal lymph node measures 14 mm in short axis. The esophagus is grossly unremarkable. No mediastinal fluid collection. Lungs/Pleura: There is an 11 mm right upper lobe nodule (35/1). This nodule is new since the prior CT and concerning for malignancy. A 6 mm left upper lobe nodule (43/1) also new compared to the prior CT. There is a 4 mm left upper lobe nodule (33/1). Several additional small nodules including a 4 mm subpleural nodule in the right lower lobe (90/1) and a 3 mm nodule in the right upper lobe (38/1). These nodules are new since the prior CT and concerning for malignancy or metastatic disease. No focal consolidation, pleural effusion, or pneumothorax. The central airways are patent. Musculoskeletal: There is a 15 mm lucent lesion with sclerotic margin in the T12, present on the prior CT. This lesion is indeterminate. There is osteopenia. A small T5 sclerotic focus was present on the prior CT and may represent a bone island. No acute osseous pathology. 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 Review of the MIP images confirms the above findings. CTA ABDOMEN AND PELVIS FINDINGS VASCULAR Aorta: Moderate atherosclerotic calcification. No aneurysmal dilatation or dissection. No periaortic fluid collection. Celiac: Patent without evidence of aneurysm, dissection, vasculitis or significant stenosis. SMA: Patent without evidence of aneurysm, dissection, vasculitis or significant stenosis. Renals: Both renal arteries are patent without evidence of aneurysm, dissection, vasculitis, fibromuscular dysplasia or significant stenosis. IMA: Patent without evidence of aneurysm, dissection, vasculitis or significant stenosis. Inflow: Atherosclerotic calcification of the iliac arteries. No aneurysmal  dilatation or dissection. The iliac arteries are patent. Veins: The IVC is unremarkable. The SMV, and main portal vein are patent. No portal venous gas. Review of the MIP images confirms the above findings. NON-VASCULAR No intra-abdominal free air or free fluid. Hepatobiliary: The liver is unremarkable. No intrahepatic biliary dilatation. The gallbladder contains air likely related to recent procedure. Clinical correlation recommended. No calcified gallstone or pericholecystic fluid. There is mild dilatation of the central biliary trees. The common bile duct measures 12 mm in diameter. No calcified stone noted in the central CBD. Pancreas: There is an ill-defined hypodense lesion in the body of the pancreas measuring approximately 2.5 x 1.5 cm (36/6. This is not characterized but concerning for malignancy. Further characterization with MRI without and with contrast recommended. There is an ill-defined hypodense extra pancreatic nodular density posterior to the pancreas adjacent to the SMA likely extension of the pancreatic lesion. No active inflammatory changes of the pancreas. Spleen: Normal in size without focal abnormality. Adrenals/Urinary Tract: The adrenal glands unremarkable. Bilateral renal cortical scarring. Small right renal cysts measure up to 2 cm in the upper pole of the right kidney. There is no hydronephrosis on either side. The visualized ureters and urinary bladder appear unremarkable. Stomach/Bowel: There is severe sigmoid and diffuse colonic diverticulosis without active inflammatory changes. There is moderate stool throughout the colon. There is no bowel obstruction or active inflammation. The appendix is normal. Lymphatic: No adenopathy. Reproductive: Ill-defined posterior body uterine fibroids measure up to approximately 4 cm. No adnexal masses. Other: Right groin surgical clips. Musculoskeletal: Osteopenia with degenerative changes of the spine. No acute osseous pathology. Review of the MIP  images confirms the above findings. IMPRESSION: 1. No CT evidence of pulmonary embolism or aortic dissection. 2. Ill-defined hypodense lesion in the body of the pancreas concerning for malignancy. Further characterization with MRI without and with contrast recommended. 3. Multiple bilateral pulmonary nodules measure up to 11 mm in the right upper lobe concerning for malignancy or metastatic disease. Clinical correlation and multidisciplinary consult is advised. 4. Severe colonic diverticulosis. No bowel obstruction. Normal appendix. 5. Aortic Atherosclerosis (ICD10-I70.0). Electronically Signed   By: Anner Crete M.D.   On: 12/29/2019 22:39      Halimah Bewick T. [place]  If 7PM-7AM, please contact night-coverage [email or web address] 12/30/2019, 6:06 PM

## 2019-12-30 NOTE — H&P (Signed)
History and Physical    Rebecca Weaver LPF:[id number] DOB: February 25, 1951 DOA: 12/29/2019  PCP: Rebecca Major, MD  Patient coming from: PCP office   Chief Complaint:  Chief Complaint  Patient presents with  . Abdominal Pain  . Tachycardia     HPI:    69 year old female with past medical history of COPD, gastroesophageal reflux disease, hypertension, hyperlipidemia who presents to [facility] emergency department with complaints of abdominal pain.  Patient explains that approximately 3 days ago he began to experience abdominal pain.  Patient describes his abdominal pain as severe in intensity, located in the epigastric and left upper quadrant.  Pain was initially "gas-like" in quality but as the pain worsened and became more and more sharp in quality.  Since late Saturday, pain has been severe in intensity, nonradiating and worse with any attempt of oral intake.  The days that followed, pain continue to worsen.  Pain became associated with an inability to tolerate oral intake, intense nausea with frequent bouts of nonbilious nonbloody vomiting and watery diarrhea.  Patient denies fevers, sick contacts or recent antibiotic use.  Patient does endorse an 11lb unintentional weight loss in the past 2 months.  Of note, patient recently traveled to [place] in [place] and just returned from her long distance trip the Friday prior to the onset of her symptoms.  Patient's symptoms continued to persist until she presented to see her PCP on 10/18.  While being evaluated patient was found to have a markedly elevated heart rate and was sent to the [place] ER for further evaluation.   Upon evaluation in the ER, patient was found to be in rapid atrial flutter.  Patient was placed on a Diltiazem infusion.  CT chest/abd/pelvis was also performed which revealed bilateral pulmonary nodules as well as a lesion in the body of the pancreas concerning for malignancy.  The hospitalist group was then  called to assist the patient for admission to the hospital.     Review of Systems:   Review of Systems  Constitutional: Positive for malaise/fatigue and weight loss.  Gastrointestinal: Positive for abdominal pain, diarrhea, nausea and vomiting.    Past Medical History:  Diagnosis Date  . Anemia   . Arthritis   . COPD (chronic obstructive pulmonary disease) ([place])   . Coronary artery calcification   . GERD (gastroesophageal reflux disease)   . Hyperlipidemia   . Hypertension   . Lung nodules   . Osteopenia   . Pseudotumor cerebri     Past Surgical History:  Procedure Laterality Date  . CHOLECYSTECTOMY    . TUBAL LIGATION    . VARICOSE VEIN SURGERY     multiple     reports that she has been smoking cigarettes. She has been smoking about 0.75 packs per day. She has never used smokeless tobacco. She reports current alcohol use. No history on file for drug use.  Allergies  Allergen Reactions  . Ciprofloxacin     Per patient  "swollen tongue and breathing problems"   . Sulfa Antibiotics     Per patient "swollen tongue and breathing problems"    Family History  Problem Relation Age of Onset  . Varicose Veins Father   . Varicose Veins Sister      Prior to Admission medications   Medication Sig Start Date End Date Taking? Authorizing Provider  amLODipine-benazepril (LOTREL) 5-20 MG capsule Take 1 capsule by mouth daily.   Yes [provider]  ezetimibe (ZETIA) 10 MG  tablet Take 10 mg by mouth daily.   Yes [provider]  fluticasone (FLONASE) 50 MCG/ACT nasal spray Place into both nostrils daily.   Yes [provider]  Fluticasone-Salmeterol (ADVAIR) 250-50 MCG/DOSE AEPB Inhale 1 puff into the lungs 2 (two) times daily.   Yes [provider]  hydrochlorothiazide (MICROZIDE) 12.5 MG capsule Take 12.5 mg by mouth daily.   Yes [provider]  loratadine (CLARITIN) 10 MG tablet Take 10 mg by mouth daily.   Yes [provider]  VITAMIN B1-B12 IJ Inject as directed every 30 (thirty) days.   Yes [provider]    Physical Exam: Vitals:   12/30/19 0000 12/30/19 0036 12/30/19 0130 12/30/19 0131  BP: 92/65 110/82 101/89   Pulse: (!) 57 (!) 140 (!) 111 94  Resp: 12 14 12 12   Temp:      TempSrc:      SpO2: 97% 93% 97% 98%  Weight:      Height:        Constitutional: Acute alert and oriented x3, no associated distress.   Skin: no rashes, no lesions, good skin turgor noted. Eyes: Pupils are equally reactive to light.  No evidence of scleral icterus or conjunctival pallor.  ENMT: Moist mucous membranes noted.  Posterior pharynx clear of any exudate or lesions.   Neck: normal, supple, no masses, no thyromegaly.  No evidence of jugular venous distension.   Respiratory: clear to auscultation bilaterally, no wheezing, no crackles. Normal respiratory effort. No accessory muscle use.  Cardiovascular: Tachycardic and regularly irregular rhythm.   no murmurs / rubs / gallops. No extremity edema. 2+ pedal pulses. No carotid bruits.  Chest:   Nontender without crepitus or deformity.   Back:   Nontender without crepitus or deformity. Abdomen: Exquisite epigastric tenderness.  Abdomen is soft however.   No evidence of intra-abdominal masses.  Positive bowel sounds noted in all quadrants.   Musculoskeletal: No joint deformity upper and lower extremities. Good ROM, no contractures. Normal muscle tone.  Neurologic: CN 2-12 grossly intact. Sensation intact.  Patient moving all 4 extremities spontaneously.  Patient is following all commands.  Patient is responsive to verbal stimuli.   Psychiatric: Patient exhibits normal mood with appropriate affect.  Patient seems to possess insight as to their current situation.     Labs on Admission: I have personally reviewed following labs and imaging studies -   CBC: Recent Labs  Lab 12/29/19 1821  WBC 8.0  HGB 15.0  HCT 44.9  MCV 103.0*  PLT 326   Basic  Metabolic Panel: Recent Labs  Lab 12/29/19 1821  NA 137  K 3.7  CL 101  CO2 24  GLUCOSE 81  BUN 15  CREATININE 0.79  CALCIUM 9.0   GFR: Estimated Creatinine Clearance: 66.4 mL/min (by C-G formula based on SCr of 0.79 mg/dL). Liver Function Tests: Recent Labs  Lab 12/29/19 1821  AST 16  ALT 16  ALKPHOS 52  BILITOT 1.1  PROT 6.0*  ALBUMIN 3.7   Recent Labs  Lab 12/29/19 1821  LIPASE 132*   No results for input(s): AMMONIA in the last 168 hours. Coagulation Profile: No results for input(s): INR, PROTIME in the last 168 hours. Cardiac Enzymes: No results for input(s): CKTOTAL, CKMB, CKMBINDEX, TROPONINI in the last 168 hours. BNP (last 3 results) No results for input(s): PROBNP in the last 8760 hours. HbA1C: No results for input(s): HGBA1C in the last 72 hours. CBG: No results for input(s): GLUCAP in the  last 168 hours. Lipid Profile: No results for input(s): CHOL, HDL, LDLCALC, TRIG, CHOLHDL, LDLDIRECT in the last 72 hours. Thyroid Function Tests: No results for input(s): TSH, T4TOTAL, FREET4, T3FREE, THYROIDAB in the last 72 hours. Anemia Panel: No results for input(s): VITAMINB12, FOLATE, FERRITIN, TIBC, IRON, RETICCTPCT in the last 72 hours. Urine analysis:    Component Value Date/Time   COLORURINE YELLOW 12/30/2019 0041   APPEARANCEUR CLEAR 12/30/2019 0041   LABSPEC >1.046 (H) 12/30/2019 0041   PHURINE 5.0 12/30/2019 0041   GLUCOSEU NEGATIVE 12/30/2019 0041   HGBUR NEGATIVE 12/30/2019 0041   BILIRUBINUR NEGATIVE 12/30/2019 0041   KETONESUR NEGATIVE 12/30/2019 0041   PROTEINUR NEGATIVE 12/30/2019 0041   NITRITE NEGATIVE 12/30/2019 0041   LEUKOCYTESUR NEGATIVE 12/30/2019 0041    Radiological Exams on Admission - Personally Reviewed: DG Chest 2 View  Result Date: 12/29/2019 CLINICAL DATA:  Tachycardia EXAM: CHEST - 2 VIEW COMPARISON:  06/06/2016, CT 12/04/2018 FINDINGS: Emphysematous disease and hyperinflation. No acute consolidation or pleural  effusion. Stable cardiomediastinal silhouette with prominent central pulmonary arteries. Aortic atherosclerosis. No pneumothorax. IMPRESSION: No active cardiopulmonary disease. Emphysematous disease. Electronically Signed   By: Donavan Foil M.D.   On: 12/29/2019 18:39   CT Angio Chest PE W and/or Wo Contrast  Result Date: 12/29/2019 CLINICAL DATA:  69 year old female with left upper quadrant abdominal pain. History of diverticulitis. Concern for mesenteric ischemia. EXAM: CT ANGIOGRAPHY CHEST, ABDOMEN AND PELVIS TECHNIQUE: Non-contrast CT of the chest was initially obtained. Multidetector CT imaging through the chest, abdomen and pelvis was performed using the standard protocol during bolus administration of intravenous contrast. Multiplanar reconstructed images and MIPs were obtained and reviewed to evaluate the vascular anatomy. CONTRAST:  168mL OMNIPAQUE IOHEXOL 350 MG/ML SOLN COMPARISON:  Chest radiograph dated 12/29/2019 and CT dated 11/21/2017. FINDINGS: Evaluation is limited due to streak artifact 0 caused by patient's arms. CTA CHEST FINDINGS Cardiovascular: There is no cardiomegaly or pericardial effusion. Three-vessel coronary vascular calcification. There is moderate atherosclerotic calcification of the thoracic aorta. No aneurysmal dilatation. Evaluation of the aorta is limited due to suboptimal opacification and timing of the contrast. No pulmonary artery embolus identified. Mediastinum/Nodes: Top-normal right hilar lymph node measures 11 mm. Subcarinal lymph node measures 14 mm in short axis. The esophagus is grossly unremarkable. No mediastinal fluid collection. Lungs/Pleura: There is an 11 mm right upper lobe nodule (35/1). This nodule is new since the prior CT and concerning for malignancy. A 6 mm left upper lobe nodule (43/1) also new compared to the prior CT. There is a 4 mm left upper lobe nodule (33/1). Several additional small nodules including a 4 mm subpleural nodule in the right lower  lobe (90/1) and a 3 mm nodule in the right upper lobe (38/1). These nodules are new since the prior CT and concerning for malignancy or metastatic disease. No focal consolidation, pleural effusion, or pneumothorax. The central airways are patent. Musculoskeletal: There is a 15 mm lucent lesion with sclerotic margin in the T12, present on the prior CT. This lesion is indeterminate. There is osteopenia. A small T5 sclerotic focus was present on the prior CT and may represent a bone island. No acute osseous pathology. 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 Review of the MIP images confirms the above findings. CTA ABDOMEN AND PELVIS FINDINGS VASCULAR Aorta: Moderate atherosclerotic calcification. No aneurysmal dilatation or dissection. No periaortic fluid collection. Celiac: Patent without evidence of aneurysm, dissection, vasculitis or significant stenosis. SMA: Patent without  evidence of aneurysm, dissection, vasculitis or significant stenosis. Renals: Both renal arteries are patent without evidence of aneurysm, dissection, vasculitis, fibromuscular dysplasia or significant stenosis. IMA: Patent without evidence of aneurysm, dissection, vasculitis or significant stenosis. Inflow: Atherosclerotic calcification of the iliac arteries. No aneurysmal dilatation or dissection. The iliac arteries are patent. Veins: The IVC is unremarkable. The SMV, and main portal vein are patent. No portal venous gas. Review of the MIP images confirms the above findings. NON-VASCULAR No intra-abdominal free air or free fluid. Hepatobiliary: The liver is unremarkable. No intrahepatic biliary dilatation. The gallbladder contains air likely related to recent procedure. Clinical correlation recommended. No calcified gallstone or pericholecystic fluid. There is mild dilatation of the central biliary trees. The common bile duct measures 12 mm in diameter. No calcified stone noted in the central  CBD. Pancreas: There is an ill-defined hypodense lesion in the body of the pancreas measuring approximately 2.5 x 1.5 cm (36/6. This is not characterized but concerning for malignancy. Further characterization with MRI without and with contrast recommended. There is an ill-defined hypodense extra pancreatic nodular density posterior to the pancreas adjacent to the SMA likely extension of the pancreatic lesion. No active inflammatory changes of the pancreas. Spleen: Normal in size without focal abnormality. Adrenals/Urinary Tract: The adrenal glands unremarkable. Bilateral renal cortical scarring. Small right renal cysts measure up to 2 cm in the upper pole of the right kidney. There is no hydronephrosis on either side. The visualized ureters and urinary bladder appear unremarkable. Stomach/Bowel: There is severe sigmoid and diffuse colonic diverticulosis without active inflammatory changes. There is moderate stool throughout the colon. There is no bowel obstruction or active inflammation. The appendix is normal. Lymphatic: No adenopathy. Reproductive: Ill-defined posterior body uterine fibroids measure up to approximately 4 cm. No adnexal masses. Other: Right groin surgical clips. Musculoskeletal: Osteopenia with degenerative changes of the spine. No acute osseous pathology. Review of the MIP images confirms the above findings. IMPRESSION: 1. No CT evidence of pulmonary embolism or aortic dissection. 2. Ill-defined hypodense lesion in the body of the pancreas concerning for malignancy. Further characterization with MRI without and with contrast recommended. 3. Multiple bilateral pulmonary nodules measure up to 11 mm in the right upper lobe concerning for malignancy or metastatic disease. Clinical correlation and multidisciplinary consult is advised. 4. Severe colonic diverticulosis. No bowel obstruction. Normal appendix. 5. Aortic Atherosclerosis (ICD10-I70.0). Electronically Signed   By: Anner Crete M.D.   On:  12/29/2019 22:39   CT Angio Abd/Pel W and/or Wo Contrast  Result Date: 12/29/2019 CLINICAL DATA:  69 year old female with left upper quadrant abdominal pain. History of diverticulitis. Concern for mesenteric ischemia. EXAM: CT ANGIOGRAPHY CHEST, ABDOMEN AND PELVIS TECHNIQUE: Non-contrast CT of the chest was initially obtained. Multidetector CT imaging through the chest, abdomen and pelvis was performed using the standard protocol during bolus administration of intravenous contrast. Multiplanar reconstructed images and MIPs were obtained and reviewed to evaluate the vascular anatomy. CONTRAST:  179mL OMNIPAQUE IOHEXOL 350 MG/ML SOLN COMPARISON:  Chest radiograph dated 12/29/2019 and CT dated 11/21/2017. FINDINGS: Evaluation is limited due to streak artifact 0 caused by patient's arms. CTA CHEST FINDINGS Cardiovascular: There is no cardiomegaly or pericardial effusion. Three-vessel coronary vascular calcification. There is moderate atherosclerotic calcification of the thoracic aorta. No aneurysmal dilatation. Evaluation of the aorta is limited due to suboptimal opacification and timing of the contrast. No pulmonary artery embolus identified. Mediastinum/Nodes: Top-normal right hilar lymph node measures 11 mm. Subcarinal lymph node measures 14 mm in short axis.  The esophagus is grossly unremarkable. No mediastinal fluid collection. Lungs/Pleura: There is an 11 mm right upper lobe nodule (35/1). This nodule is new since the prior CT and concerning for malignancy. A 6 mm left upper lobe nodule (43/1) also new compared to the prior CT. There is a 4 mm left upper lobe nodule (33/1). Several additional small nodules including a 4 mm subpleural nodule in the right lower lobe (90/1) and a 3 mm nodule in the right upper lobe (38/1). These nodules are new since the prior CT and concerning for malignancy or metastatic disease. No focal consolidation, pleural effusion, or pneumothorax. The central airways are patent.  Musculoskeletal: There is a 15 mm lucent lesion with sclerotic margin in the T12, present on the prior CT. This lesion is indeterminate. There is osteopenia. A small T5 sclerotic focus was present on the prior CT and may represent a bone island. No acute osseous pathology. 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 Review of the MIP images confirms the above findings. CTA ABDOMEN AND PELVIS FINDINGS VASCULAR Aorta: Moderate atherosclerotic calcification. No aneurysmal dilatation or dissection. No periaortic fluid collection. Celiac: Patent without evidence of aneurysm, dissection, vasculitis or significant stenosis. SMA: Patent without evidence of aneurysm, dissection, vasculitis or significant stenosis. Renals: Both renal arteries are patent without evidence of aneurysm, dissection, vasculitis, fibromuscular dysplasia or significant stenosis. IMA: Patent without evidence of aneurysm, dissection, vasculitis or significant stenosis. Inflow: Atherosclerotic calcification of the iliac arteries. No aneurysmal dilatation or dissection. The iliac arteries are patent. Veins: The IVC is unremarkable. The SMV, and main portal vein are patent. No portal venous gas. Review of the MIP images confirms the above findings. NON-VASCULAR No intra-abdominal free air or free fluid. Hepatobiliary: The liver is unremarkable. No intrahepatic biliary dilatation. The gallbladder contains air likely related to recent procedure. Clinical correlation recommended. No calcified gallstone or pericholecystic fluid. There is mild dilatation of the central biliary trees. The common bile duct measures 12 mm in diameter. No calcified stone noted in the central CBD. Pancreas: There is an ill-defined hypodense lesion in the body of the pancreas measuring approximately 2.5 x 1.5 cm (36/6. This is not characterized but concerning for malignancy. Further characterization with MRI without and with contrast  recommended. There is an ill-defined hypodense extra pancreatic nodular density posterior to the pancreas adjacent to the SMA likely extension of the pancreatic lesion. No active inflammatory changes of the pancreas. Spleen: Normal in size without focal abnormality. Adrenals/Urinary Tract: The adrenal glands unremarkable. Bilateral renal cortical scarring. Small right renal cysts measure up to 2 cm in the upper pole of the right kidney. There is no hydronephrosis on either side. The visualized ureters and urinary bladder appear unremarkable. Stomach/Bowel: There is severe sigmoid and diffuse colonic diverticulosis without active inflammatory changes. There is moderate stool throughout the colon. There is no bowel obstruction or active inflammation. The appendix is normal. Lymphatic: No adenopathy. Reproductive: Ill-defined posterior body uterine fibroids measure up to approximately 4 cm. No adnexal masses. Other: Right groin surgical clips. Musculoskeletal: Osteopenia with degenerative changes of the spine. No acute osseous pathology. Review of the MIP images confirms the above findings. IMPRESSION: 1. No CT evidence of pulmonary embolism or aortic dissection. 2. Ill-defined hypodense lesion in the body of the pancreas concerning for malignancy. Further characterization with MRI without and with contrast recommended. 3. Multiple bilateral pulmonary nodules measure up to 11 mm in the right upper lobe concerning for malignancy or  metastatic disease. Clinical correlation and multidisciplinary consult is advised. 4. Severe colonic diverticulosis. No bowel obstruction. Normal appendix. 5. Aortic Atherosclerosis (ICD10-I70.0). Electronically Signed   By: Anner Crete M.D.   On: 12/29/2019 22:39    EKG: Personally reviewed.  Rhythm is atrial flutter with 2:1 block with heart rate of 135BPM.  No dynamic ST segment changes appreciated.  Assessment/Plan Principal Problem:   Atrial flutter with rapid ventricular  response [facility])   Patient presenting with rapid atrial flutter with heart rates of one hundred thirties to a 38 forties upon arrival upon arrival to the emergency department  Patient denies symptoms making it difficult to ascertain when patient went into atrial flutter.    Patient has been initiated on a diltiazem infusion by the emergency department provider.  This will be titrated to achieve target heart rates of at least less than 90-100 after which patient will be switched to oral diltiazem therapy.  CHADSVASC score is (3-4, Female, age, HTN, add a 4th point for chronic venous insufficiency) -warranting full dose anticoagulation  After discussing options with the patient patient has been placed on Eliquis 5 mg twice daily  Troponin and potassium are unremarkable.  TSH, magnesium are pending.  Echocardiogram has been ordered for the morning.  Consider cardiology consultation in the morning versus outpatient cardiology follow-up.  Active Problems:   Epigastric pain   Patient presented with 3-day history of severe epigastric abdominal pain nausea vomiting and diarrhea  On presentation, lipase found to be somewhat elevated at 132 with CT imaging of the abdomen and pelvis revealing a questionable lesion in the body of the pancreas concerning for malignancy according to the radiologist.  At this point, patient may be suffering from malignancy (especially with concerning for metastatic lung lesions), pancreatitis, or both.    MRI imaging of the abdomen has been ordered for the morning per radiology recommendation  Placing patient on a clear liquid diet in the meantime as tolerated  As needed opiate-based analgesics for associated pain  As needed antiemetics  Based on MRI results we will consider consultation of gastroenterology and eventually oncology once tissue biopsy is achieved.    Nicotine dependence, cigarettes, uncomplicated   Counseling patient on cessation daily     Pancreatic lesion   Please see assessment and plan above.    Pulmonary nodules/lesions, multiple   Multiple lesions of the bilateral lung concerning for metastatic process.  Unclear primary, presumably metastatic from pancreatic lesion.  Outpatient follow-up    COPD (chronic obstructive pulmonary disease) ([facility])   No evidence of COPD exacerbation  Continue home regimen of maintenance inhalers  As needed bronchodilator therapy for shortness of breath and wheezing.    Essential hypertension   Holding home oral antihypertensive therapy as patient is exhibiting low normal blood pressures on diltiazem infusion    Mixed hyperlipidemia  Continue lipid-lowering therapies    Code Status:  Full code Family Communication: deferred   Status is: Observation  The patient remains OBS appropriate and will d/c before 2 midnights.  Dispo: The patient is from: Home              Anticipated d/c is to: Home              Anticipated d/c date is: 2 days              Patient currently is not medically stable to d/c.        Vernelle Emerald MD [facility] Pager [phone]  If 7PM-7AM, please contact night-coverage [email or web address] Use universal [place] password for that web site. If you do not have the password, please call the hospital operator.  12/30/2019, 1:36 AM

## 2019-12-30 NOTE — Progress Notes (Signed)
Left lower extremity venous duplex has been completed. Preliminary results can be found in CV Proc through chart review.  Results were given to Dr. Jeanell Sparrow.  12/30/19 10:32 AM Rebecca Weaver RVT

## 2019-12-31 ENCOUNTER — Other Ambulatory Visit: Payer: Self-pay | Admitting: Gastroenterology

## 2019-12-31 LAB — IRON AND TIBC
Iron: 48 ug/dL (ref 28–170)
Saturation Ratios: 17 % (ref 10.4–31.8)
TIBC: 287 ug/dL (ref 250–450)
UIBC: 239 ug/dL

## 2019-12-31 LAB — LIPASE, BLOOD: Lipase: 135 U/L — ABNORMAL HIGH (ref 11–51)

## 2019-12-31 LAB — RENAL FUNCTION PANEL
Albumin: 3.3 g/dL — ABNORMAL LOW (ref 3.5–5.0)
Anion gap: 10 (ref 5–15)
BUN: 8 mg/dL (ref 8–23)
CO2: 26 mmol/L (ref 22–32)
Calcium: 8.8 mg/dL — ABNORMAL LOW (ref 8.9–10.3)
Chloride: 103 mmol/L (ref 98–111)
Creatinine, Ser: 0.67 mg/dL (ref 0.44–1.00)
GFR, Estimated: >60 mL/min (ref >60)
Glucose, Bld: 98 mg/dL (ref 70–99)
Phosphorus: 2.9 mg/dL (ref 2.5–4.6)
Potassium: 3.6 mmol/L (ref 3.5–5.1)
Sodium: 139 mmol/L (ref 135–145)

## 2019-12-31 LAB — VITAMIN B12: Vitamin B-12: 685 pg/mL (ref 180–914)

## 2019-12-31 LAB — CANCER ANTIGEN 19-9: CA 19-9: 19 U/mL (ref 0–35)

## 2019-12-31 LAB — RETICULOCYTES
Immature Retic Fract: 5.5 % (ref 2.3–15.9)
RBC.: 4 MIL/uL (ref 3.87–5.11)
Retic Count, Absolute: 73.2 10*3/uL (ref 19.0–186.0)
Retic Ct Pct: 1.8 % (ref 0.4–3.1)

## 2019-12-31 LAB — FERRITIN: Ferritin: 83 ng/mL (ref 11–307)

## 2019-12-31 LAB — MAGNESIUM: Magnesium: 1.7 mg/dL (ref 1.7–2.4)

## 2019-12-31 LAB — FOLATE: Folate: 26.7 ng/mL (ref >5.9)

## 2019-12-31 MED ORDER — APIXABAN 5 MG PO TABS: 5.0000 mg | ORAL_TABLET | Freq: Two times a day (BID) | ORAL | 1 refills | Status: DC

## 2019-12-31 NOTE — Progress Notes (Signed)
Patient is scheduled for EUS with Dr [name] 01/02/20. Patient had negative COVID test 12/29/2019 while inpatient at [facility] she will be discharged 12/31/19. Patient will quarantine at home until procedure. She is vaccinated for COVID and her last dose was more than 1 month ago. Call placed to Dr. Roanna Banning to review this plan. He states 12/29/2019 test is sufficient for 01/02/2020 procedure with this plan.  Caryl Pina at Dr. [name] office is aware.

## 2019-12-31 NOTE — Plan of Care (Signed)

## 2019-12-31 NOTE — Plan of Care (Signed)

## 2019-12-31 NOTE — Discharge Summary (Signed)
Physician Discharge Summary  Rebecca Weaver:[id number] DOB: 01-22-1951 DOA: 12/29/2019  PCP: Nickola Major, MD  Admit date: 12/29/2019 Discharge date: 12/31/2019  Admitted From: Home Disposition:  Home   Recommendations for Outpatient Follow-up:  1. Follow up with PCP in 1-2 weeks 2. Please obtain BMP/CBC in one week Please follow up with cardiology in one week.  Please follow up with gastroenterology on Friday for EUS.   Discharge Condition: stable CODE STATUS: Full code Diet recommendation: Heart Healthy /  Brief/Interim Summary: 69 year old female with history of COPD not on oxygen, pulmonary nodules, HTN, GERD, hyperlipidemia and tobacco use disorder presenting from PCP office with new onset atrial flutter.  Patient presented to PCP with 3 days of abdominal pain, nausea, vomiting, diarrhea and about 11 pound weight loss, and found to be in a flutter.  She was sent to ED for evaluation.  In ED, she was found to be in a flutter with RVR to 140s.  CMP within normal.  Lipase 132.  CBC within normal except for MCV of 103.  EKG confirmed atrial flutter.  CXR without acute finding but emphysematous change.  BNP and serial troponin negative.  Influenza PCR and COVID-19 PCR negative.  UA without significant finding.  CT chest/abdomen/pelvis revealed by lateral pulmonary nodules with the largest one in RUL measuring about 11 mm,  pancreatic lesion concerning for malignancy and CBD to 12 mm.  MRI abdomen, echocardiogram and  lower extremity venous Doppler ordered.  Started on Cardizem drip and Eliquis and admitted.  The next day, MRI abdomen confirmed hyperenhancing 2.2 x 1.5 x 1.8 cm mass within and along the posterior inferior pancreatic body highly suspicious for pancreatic adenocarcinoma without finding to suggest abdominal metastasis.  [facility] consulted and will do EUS with FNA on Friday either inpatient or outpatient.  In regards to atrial flutter.  Heart rate improved and  transitioned to p.o. Cardizem.  However, she went back in mild RVR with HR in 110s to 120s, and BP dropped to 82/51.  Echocardiogram with EF of 50 to 55% and severe LAE.  Discussed with cardiology, Dr. Sallyanne Kuster.   PT seen and examined today, denies any new complaints. Would like to go home and follow up.    Discharge Diagnoses:  Principal Problem:   Atrial flutter with rapid ventricular response (HCC) Active Problems:   Nicotine dependence, cigarettes, uncomplicated   Pancreatic lesion   Epigastric pain   Pulmonary nodules/lesions, multiple   COPD (chronic obstructive pulmonary disease) (HCC)   Essential hypertension   Mixed hyperlipidemia   Atrial fibrillation with RVR ([place])  New onset atrial flutter with RVR: transitioned to p.o. Cardizem on discharge.   Echocardiogram with EF of 50 to 55% and severe LAE.  TSH within normal. -Continue p.o. Cardizem on discharge.  -Hold Eliquis in anticipation for pancreatic biopsy on Friday inpatient or outpatient. -Discussed with cardiology, Dr. Sallyanne Kuster, recommend follow up with cardiology in one week as outpatient.   Pancreatic mass: a 2.2 x 1.5 x 1.8 cm mass within and along the posterior inferior pancreatic body noted on MRI and CT.  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  No finding to suggest abdominal metastasis. -GI consulted-plan for EUS FNA on Friday either inpatient or outpatient. -CA 19-9 ordered. Follow up   Nausea/vomiting/abdominal pain/weight loss-likely due pancreatic lesion.  Nausea and vomiting improved.  Still with abdominal tenderness on exam.  Lipase improved  Hypotension in patient with essential hypertension Resolved.   Acute hypoxemic respiratory failure/chronic COPD-intermittent desaturation to upper  80s.  Saturating at 90% at rest on room air.  Not on oxygen at home.  CXR reassuring.  CTA chest negative for PE but pulmonary nodules.  Troponin and BNP within normal.  Echo reassuring.  Does not appear to be in COPD  exacerbation. -Continue home Breo Ellipta -Add as needed Atrovent and Xopenex -Incentive spirometry.  Tobacco use disorder: About 47-pack-year history.  Cut down to 10 cigarettes a day about 2 years ago. -Encouraged cessation -Continue nicotine patch.  Macrocytosis without anemia: Could be from chronic COPD/hypoxia -Vitamin I14 and folic acid level level appropriate.  Dyslipidemia -Continue home Zetia  Unintentional weight loss: She reports about 11 pound weight loss recently.  Definitely lost about 22 pounds in the last 4 years. Recommend outpatient follow up of the pancreatic mass.    Discharge Instructions  Discharge Instructions    Amb Referral to AFIB Clinic   Complete by: As directed    New onset atrial flutter   Ambulatory referral to Cardiology   Complete by: As directed    For new onset atrial fibrillation.   Call MD for:  difficulty breathing, headache or visual disturbances   Complete by: As directed    Call MD for:  extreme fatigue   Complete by: As directed    Call MD for:  persistant dizziness or light-headedness   Complete by: As directed    Call MD for:  persistant nausea and vomiting   Complete by: As directed    Call MD for:  severe uncontrolled pain   Complete by: As directed    Call MD for:  temperature >100.4   Complete by: As directed    Diet - low sodium heart healthy   Complete by: As directed    Diet - low sodium heart healthy   Complete by: As directed    Discharge instructions   Complete by: As directed    It has been a pleasure taking care of you!  You were hospitalized due to atrial flutter (abnormally fast heart rate).  We have started you on medication (Cardizem) to regulate your heart.  Please continue taking this medication as prescribed.  We are also discharging you on blood thinner (apixaban).  Please do not start this medication until you have you have pancreatic biopsy done.  Please follow-up with gastroenterologist, Dr. [name]  for pancreatic biopsy.  Cardiology will arrange outpatient follow-up new A-flutter.  Please review your new medication list and the directions on your medications before you take them.   Take care,   Increase activity slowly   Complete by: As directed    Increase activity slowly   Complete by: As directed      Allergies as of 12/31/2019      Reactions   Ciprofloxacin    Per patient  "swollen tongue and breathing problems"    Sulfa Antibiotics    Per patient "swollen tongue and breathing problems"      Medication List    STOP taking these medications   amLODipine-benazepril 5-20 MG capsule Commonly known as: LOTREL   hydrochlorothiazide 12.5 MG capsule Commonly known as: MICROZIDE     TAKE these medications   apixaban 5 MG Tabs tablet Commonly known as: ELIQUIS Take 1 tablet (5 mg total) by mouth 2 (two) times daily. Do not start this until you have the pancreatic biopsy done by GI. Start taking on: January 05, 2020   diltiazem 120 MG 24 hr capsule Commonly known as: Cardizem CD Take 1 capsule (120  mg total) by mouth daily.   ezetimibe 10 MG tablet Commonly known as: ZETIA Take 10 mg by mouth daily.   fluticasone 50 MCG/ACT nasal spray Commonly known as: FLONASE Place into both nostrils daily.   Fluticasone-Salmeterol 250-50 MCG/DOSE Aepb Commonly known as: ADVAIR Inhale 1 puff into the lungs 2 (two) times daily.   loratadine 10 MG tablet Commonly known as: CLARITIN Take 10 mg by mouth daily.   ondansetron 4 MG tablet Commonly known as: Zofran Take 1 tablet (4 mg total) by mouth every 8 (eight) hours as needed for nausea or vomiting.   VITAMIN B1-B12 IJ Inject as directed every 30 (thirty) days.       Follow-up Information    Nickola Major, MD. Schedule an appointment as soon as possible for a visit in 1 week(s).   Specialty: Family Medicine Contact information: [address] [place] [address] [phone]        [name],  MD. Schedule an appointment as soon as possible for a visit in 3 day(s).   Specialty: Gastroenterology Contact information: [address] [place] [address] [phone]        Croitoru, Dani Gobble, MD. Schedule an appointment as soon as possible for a visit in 2 week(s).   Specialty: Cardiology Contact information: [address] [place] [address] [phone]              Allergies  Allergen Reactions  . Ciprofloxacin     Per patient  "swollen tongue and breathing problems"   . Sulfa Antibiotics     Per patient "swollen tongue and breathing problems"    Consultations: Gastroenterology.   Procedures/Studies: DG Chest 2 View  Result Date: 12/29/2019 CLINICAL DATA:  Tachycardia EXAM: CHEST - 2 VIEW COMPARISON:  06/06/2016, CT 12/04/2018 FINDINGS: Emphysematous disease and hyperinflation. No acute consolidation or pleural effusion. Stable cardiomediastinal silhouette with prominent central pulmonary arteries. Aortic atherosclerosis. No pneumothorax. IMPRESSION: No active cardiopulmonary disease. Emphysematous disease. Electronically Signed   By: Donavan Foil M.D.   On: 12/29/2019 18:39   CT Angio Chest PE W and/or Wo Contrast  Result Date: 12/29/2019 CLINICAL DATA:  69 year old female with left upper quadrant abdominal pain. History of diverticulitis. Concern for mesenteric ischemia. EXAM: CT ANGIOGRAPHY CHEST, ABDOMEN AND PELVIS TECHNIQUE: Non-contrast CT of the chest was initially obtained. Multidetector CT imaging through the chest, abdomen and pelvis was performed using the standard protocol during bolus administration of intravenous contrast. Multiplanar reconstructed images and MIPs were obtained and reviewed to evaluate the vascular anatomy. CONTRAST:  153mL OMNIPAQUE IOHEXOL 350 MG/ML SOLN COMPARISON:  Chest radiograph dated 12/29/2019 and CT dated 11/21/2017. FINDINGS: Evaluation is limited due to streak artifact 0 caused by patient's  arms. CTA CHEST FINDINGS Cardiovascular: There is no cardiomegaly or pericardial effusion. Three-vessel coronary vascular calcification. There is moderate atherosclerotic calcification of the thoracic aorta. No aneurysmal dilatation. Evaluation of the aorta is limited due to suboptimal opacification and timing of the contrast. No pulmonary artery embolus identified. Mediastinum/Nodes: Top-normal right hilar lymph node measures 11 mm. Subcarinal lymph node measures 14 mm in short axis. The esophagus is grossly unremarkable. No mediastinal fluid collection. Lungs/Pleura: There is an 11 mm right upper lobe nodule (35/1). This nodule is new since the prior CT and concerning for malignancy. A 6 mm left upper lobe nodule (43/1) also new compared to the prior CT. There is a 4 mm left upper lobe nodule (33/1). Several additional small nodules including a 4 mm  subpleural nodule in the right lower lobe (90/1) and a 3 mm nodule in the right upper lobe (38/1). These nodules are new since the prior CT and concerning for malignancy or metastatic disease. No focal consolidation, pleural effusion, or pneumothorax. The central airways are patent. Musculoskeletal: There is a 15 mm lucent lesion with sclerotic margin in the T12, present on the prior CT. This lesion is indeterminate. There is osteopenia. A small T5 sclerotic focus was present on the prior CT and may represent a bone island. No acute osseous pathology. 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 Review of the MIP images confirms the above findings. CTA ABDOMEN AND PELVIS FINDINGS VASCULAR Aorta: Moderate atherosclerotic calcification. No aneurysmal dilatation or dissection. No periaortic fluid collection. Celiac: Patent without evidence of aneurysm, dissection, vasculitis or significant stenosis. SMA: Patent without evidence of aneurysm, dissection, vasculitis or significant stenosis. Renals: Both renal arteries are patent  without evidence of aneurysm, dissection, vasculitis, fibromuscular dysplasia or significant stenosis. IMA: Patent without evidence of aneurysm, dissection, vasculitis or significant stenosis. Inflow: Atherosclerotic calcification of the iliac arteries. No aneurysmal dilatation or dissection. The iliac arteries are patent. Veins: The IVC is unremarkable. The SMV, and main portal vein are patent. No portal venous gas. Review of the MIP images confirms the above findings. NON-VASCULAR No intra-abdominal free air or free fluid. Hepatobiliary: The liver is unremarkable. No intrahepatic biliary dilatation. The gallbladder contains air likely related to recent procedure. Clinical correlation recommended. No calcified gallstone or pericholecystic fluid. There is mild dilatation of the central biliary trees. The common bile duct measures 12 mm in diameter. No calcified stone noted in the central CBD. Pancreas: There is an ill-defined hypodense lesion in the body of the pancreas measuring approximately 2.5 x 1.5 cm (36/6. This is not characterized but concerning for malignancy. Further characterization with MRI without and with contrast recommended. There is an ill-defined hypodense extra pancreatic nodular density posterior to the pancreas adjacent to the SMA likely extension of the pancreatic lesion. No active inflammatory changes of the pancreas. Spleen: Normal in size without focal abnormality. Adrenals/Urinary Tract: The adrenal glands unremarkable. Bilateral renal cortical scarring. Small right renal cysts measure up to 2 cm in the upper pole of the right kidney. There is no hydronephrosis on either side. The visualized ureters and urinary bladder appear unremarkable. Stomach/Bowel: There is severe sigmoid and diffuse colonic diverticulosis without active inflammatory changes. There is moderate stool throughout the colon. There is no bowel obstruction or active inflammation. The appendix is normal. Lymphatic: No  adenopathy. Reproductive: Ill-defined posterior body uterine fibroids measure up to approximately 4 cm. No adnexal masses. Other: Right groin surgical clips. Musculoskeletal: Osteopenia with degenerative changes of the spine. No acute osseous pathology. Review of the MIP images confirms the above findings. IMPRESSION: 1. No CT evidence of pulmonary embolism or aortic dissection. 2. Ill-defined hypodense lesion in the body of the pancreas concerning for malignancy. Further characterization with MRI without and with contrast recommended. 3. Multiple bilateral pulmonary nodules measure up to 11 mm in the right upper lobe concerning for malignancy or metastatic disease. Clinical correlation and multidisciplinary consult is advised. 4. Severe colonic diverticulosis. No bowel obstruction. Normal appendix. 5. Aortic Atherosclerosis (ICD10-I70.0). Electronically Signed   By: Anner Crete M.D.   On: 12/29/2019 22:39   MR Abdomen W or Wo Contrast  Result Date: 12/30/2019 CLINICAL DATA:  Left upper quadrant abdominal pain. Possible infiltrating pancreatic mass in the body of the  pancreas on CT scan of 01/29/2020. EXAM: MRI ABDOMEN WITHOUT AND WITH CONTRAST TECHNIQUE: Multiplanar multisequence MR imaging of the abdomen was performed both before and after the administration of intravenous contrast. CONTRAST:  6.60mL GADAVIST GADOBUTROL 1 MMOL/ML IV SOLN COMPARISON:  01/29/2020 FINDINGS: Despite efforts by the technologist and patient, motion artifact is present on today's exam and could not be eliminated. This reduces exam sensitivity and specificity. Lower chest: Mild cardiomegaly. The patient has known pulmonary nodules which are not well seen on today's MRI. Hepatobiliary: 5 mm T2 hyperintense, T1 hyperintense lesion in the lateral segment left hepatic lobe on image 36 of series 17, not appreciably enhancing, probably a small complex cyst. Cholecystectomy. Common bile duct measures up to 1.2 cm in diameter, likely a  physiologic response to cholecystectomy. Pancreas: Hypoenhancing 2.2 by 1.5 by 1.8 cm mass within and along the posteroinferior pancreatic body abutting the anterior margin of the superior mesenteric artery but not wrapping around the SMA. This lesion is mildly T2 hyperintense compared to the surrounding pancreatic parenchyma. There is anterior displacement of the dorsal pancreatic duct in the vicinity of the mass, and borderline prominence of the duct along with a 0.8 by 0.5 cm cystic lesion along the left lateral margin of the mass which may represent a dilated side duct or conceivably even a small adjacent IPMN. The right margin of mass is tangential to the edge of the portal vein and there is poor opacification splenic vein which is in the immediate vicinity of the mass and accordingly suspicious for splenic vein thrombosis. Spleen:  No splenomegaly or focal splenic lesion. Adrenals/Urinary Tract: Lobularity of the left adrenal gland without a discrete mass. Bilateral renal cysts. Mild scarring of the left kidney lower pole. Complex 1.1 by 0.7 cm Bosniak category 2 cyst of the right kidney upper pole on image 53 of series 17. Stomach/Bowel: Colonic diverticulosis. Vascular/Lymphatic: As noted above, there is poor opacification of the splenic vein adjacent to the pancreatic mass, suspicious for localized occlusion. The mass is tangential to the anterior margin of the SMA and the left lateral margin of the portal venous confluence, without wrapping around either of the structures. Aortoiliac atherosclerotic vascular disease. Other:  No supplemental non-categorized findings. Musculoskeletal: Levoconvex thoracolumbar scoliosis with degenerative disc disease and intervertebral spurring. IMPRESSION: 1. Hypoenhancing 2.2 by 1.5 by 1.8 cm mass within and along the posteroinferior pancreatic body highly suspicious for pancreatic adenocarcinoma. The right margin of the mass is tangential to the anterior margin of the SMA  and the left lateral margin of the portal venous confluence, with poor opacification of the splenic vein in the immediate vicinity of the mass, suspicious for localized occlusion. The mass is minimally tangential to the anterior margin of the SMA, and the left margin of the portal venous confluence. 2. No findings of metastatic disease in the abdomen. The patient has known pulmonary nodules which are not well seen on today's MRI. 3. Colonic diverticulosis. 4. Levoconvex thoracolumbar scoliosis with degenerative disc disease and intervertebral spurring. 5.  Aortic Atherosclerosis (ICD10-I70.0). Electronically Signed   By: Van Clines M.D.   On: 12/30/2019 10:26   ECHOCARDIOGRAM COMPLETE  Result Date: 12/30/2019    ECHOCARDIOGRAM REPORT   Patient Name:   Gwendy [name] [facility] Date of Exam: 12/30/2019 Medical Rec #:  [id number]       Height:       67.0 in Accession #:    [id number]      Weight:       142.0  lb Date of Birth:  October 12, 1950        BSA:          1.748 m Patient Age:    69 years        BP:           113/86 mmHg Patient Gender: F               HR:           85 bpm. Exam Location:  Inpatient Procedure: 2D Echo Indications:    atrial flutter 427.32  History:        Patient has no prior history of Echocardiogram examinations.                 COPD, Arrythmias:Atrial Fibrillation and Atrial Flutter; Risk                 Factors:Hypertension and Dyslipidemia.  Sonographer:    Johny Chess Referring Phys: [id number] Vernelle Emerald  Sonographer Comments: Image acquisition challenging due to respiratory motion. IMPRESSIONS  1. Left ventricular ejection fraction, by estimation, is 50 to 55%. The left ventricle has low normal function. The left ventricle has no regional wall motion abnormalities. Left ventricular diastolic parameters are indeterminate.  2. Right ventricular systolic function is low normal. The right ventricular size is mildly enlarged. Mildly increased right ventricular wall thickness.  3. Left  atrial size was severely dilated.  4. The mitral valve is grossly normal. Trivial mitral valve regurgitation.  5. The aortic valve is tricuspid. Aortic valve regurgitation is not visualized.  6. The inferior vena cava is dilated in size with <50% respiratory variability, suggesting right atrial pressure of 15 mmHg. Comparison(s): No prior Echocardiogram. FINDINGS  Left Ventricle: Left ventricular ejection fraction, by estimation, is 50 to 55%. The left ventricle has low normal function. The left ventricle has no regional wall motion abnormalities. The left ventricular internal cavity size was normal in size. There is borderline left ventricular hypertrophy. Left ventricular diastolic parameters are indeterminate. Right Ventricle: The right ventricular size is mildly enlarged. Mildly increased right ventricular wall thickness. Right ventricular systolic function is low normal. Left Atrium: Left atrial size was severely dilated. Right Atrium: Right atrial size was normal in size. Pericardium: There is no evidence of pericardial effusion. Mitral Valve: The mitral valve is grossly normal. Trivial mitral valve regurgitation. Tricuspid Valve: The tricuspid valve is grossly normal. Tricuspid valve regurgitation is trivial. Aortic Valve: The aortic valve is tricuspid. Aortic valve regurgitation is not visualized. Pulmonic Valve: The pulmonic valve was not well visualized. Pulmonic valve regurgitation is not visualized. Aorta: The aortic root and ascending aorta are structurally normal, with no evidence of dilitation. Venous: The pulmonary veins were not well visualized. The inferior vena cava is dilated in size with less than 50% respiratory variability, suggesting right atrial pressure of 15 mmHg. IAS/Shunts: No atrial level shunt detected by color flow Doppler.  LEFT VENTRICLE PLAX 2D LVIDd:         5.00 cm LVIDs:         3.50 cm LV PW:         1.10 cm LV IVS:        1.00 cm LVOT diam:     2.10 cm LV SV:         48 LV SV  Index:   28 LVOT Area:     3.46 cm  RIGHT VENTRICLE             IVC  RV S prime:     10.00 cm/s  IVC diam: 2.30 cm TAPSE (M-mode): 1.8 cm LEFT ATRIUM             Index       RIGHT ATRIUM           Index LA diam:        4.30 cm 2.46 cm/m  RA Area:     13.40 cm LA Vol (A2C):   58.5 ml 33.46 ml/m RA Volume:   28.50 ml  16.30 ml/m LA Vol (A4C):   79.9 ml 45.70 ml/m LA Biplane Vol: 74.1 ml 42.38 ml/m  AORTIC VALVE LVOT Vmax:   81.70 cm/s LVOT Vmean:  51.100 cm/s LVOT VTI:    0.140 m  AORTA Ao Root diam: 3.00 cm Ao Asc diam:  2.90 cm  SHUNTS Systemic VTI:  0.14 m Systemic Diam: 2.10 cm Rudean Haskell MD Electronically signed by Rudean Haskell MD Signature Date/Time: 12/30/2019/3:50:11 PM    Final    VAS [place] LOWER EXTREMITY VENOUS (DVT) (ONLY [facility] & [facility])  Result Date: 12/30/2019  Lower Venous DVT Study Indications: Pain.  Comparison Study: No prior studies. Performing Technologist: Oliver Hum RVT  Examination Guidelines: A complete evaluation includes B-mode imaging, spectral Doppler, color Doppler, and power Doppler as needed of all accessible portions of each vessel. Bilateral testing is considered an integral part of a complete examination. Limited examinations for reoccurring indications may be performed as noted. The reflux portion of the exam is performed with the patient in reverse Trendelenburg.  +-----+---------------+---------+-----------+----------+--------------+ RIGHTCompressibilityPhasicitySpontaneityPropertiesThrombus Aging +-----+---------------+---------+-----------+----------+--------------+ CFV  Full           Yes      Yes                                 +-----+---------------+---------+-----------+----------+--------------+   +---------+---------------+---------+-----------+----------+--------------+ LEFT     CompressibilityPhasicitySpontaneityPropertiesThrombus Aging +---------+---------------+---------+-----------+----------+--------------+ CFV      Full            Yes      Yes                                 +---------+---------------+---------+-----------+----------+--------------+ SFJ      Full                                                        +---------+---------------+---------+-----------+----------+--------------+ FV Prox  Full                                                        +---------+---------------+---------+-----------+----------+--------------+ FV Mid   Full                                                        +---------+---------------+---------+-----------+----------+--------------+ FV DistalFull                                                        +---------+---------------+---------+-----------+----------+--------------+  PFV      Full                                                        +---------+---------------+---------+-----------+----------+--------------+ POP      Full           Yes      Yes                                 +---------+---------------+---------+-----------+----------+--------------+ PTV      Full                                                        +---------+---------------+---------+-----------+----------+--------------+ PERO     Full                                                        +---------+---------------+---------+-----------+----------+--------------+ 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    Summary: RIGHT: - No evidence of common femoral vein obstruction.  LEFT: - There is no evidence of deep vein thrombosis in the lower extremity.  - No cystic structure found in the popliteal fossa. 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  *See table(s) above for measurements and observations. Electronically signed by Curt Jews MD on 12/30/2019 at 4:27:28 PM.    Final    CT Angio Abd/Pel W and/or Wo Contrast  Result Date: 12/29/2019 CLINICAL DATA:   69 year old female with left upper quadrant abdominal pain. History of diverticulitis. Concern for mesenteric ischemia. EXAM: CT ANGIOGRAPHY CHEST, ABDOMEN AND PELVIS TECHNIQUE: Non-contrast CT of the chest was initially obtained. Multidetector CT imaging through the chest, abdomen and pelvis was performed using the standard protocol during bolus administration of intravenous contrast. Multiplanar reconstructed images and MIPs were obtained and reviewed to evaluate the vascular anatomy. CONTRAST:  167mL OMNIPAQUE IOHEXOL 350 MG/ML SOLN COMPARISON:  Chest radiograph dated 12/29/2019 and CT dated 11/21/2017. FINDINGS: Evaluation is limited due to streak artifact 0 caused by patient's arms. CTA CHEST FINDINGS Cardiovascular: There is no cardiomegaly or pericardial effusion. Three-vessel coronary vascular calcification. There is moderate atherosclerotic calcification of the thoracic aorta. No aneurysmal dilatation. Evaluation of the aorta is limited due to suboptimal opacification and timing of the contrast. No pulmonary artery embolus identified. Mediastinum/Nodes: Top-normal right hilar lymph node measures 11 mm. Subcarinal lymph node measures 14 mm in short axis. The esophagus is grossly unremarkable. No mediastinal fluid collection. Lungs/Pleura: There is an 11 mm right upper lobe nodule (35/1). This nodule is new since the prior CT and concerning for malignancy. A 6 mm left upper lobe nodule (43/1) also new compared to the prior CT. There is a 4 mm left upper lobe nodule (33/1). Several additional small nodules including a 4 mm subpleural nodule in the right lower lobe (90/1) and a 3 mm nodule in the right upper lobe (38/1). These  nodules are new since the prior CT and concerning for malignancy or metastatic disease. No focal consolidation, pleural effusion, or pneumothorax. The central airways are patent. Musculoskeletal: There is a 15 mm lucent lesion with sclerotic margin in the T12, present on the prior CT.  This lesion is indeterminate. There is osteopenia. A small T5 sclerotic focus was present on the prior CT and may represent a bone island. No acute osseous pathology. 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 Review of the MIP images confirms the above findings. CTA ABDOMEN AND PELVIS FINDINGS VASCULAR Aorta: Moderate atherosclerotic calcification. No aneurysmal dilatation or dissection. No periaortic fluid collection. Celiac: Patent without evidence of aneurysm, dissection, vasculitis or significant stenosis. SMA: Patent without evidence of aneurysm, dissection, vasculitis or significant stenosis. Renals: Both renal arteries are patent without evidence of aneurysm, dissection, vasculitis, fibromuscular dysplasia or significant stenosis. IMA: Patent without evidence of aneurysm, dissection, vasculitis or significant stenosis. Inflow: Atherosclerotic calcification of the iliac arteries. No aneurysmal dilatation or dissection. The iliac arteries are patent. Veins: The IVC is unremarkable. The SMV, and main portal vein are patent. No portal venous gas. Review of the MIP images confirms the above findings. NON-VASCULAR No intra-abdominal free air or free fluid. Hepatobiliary: The liver is unremarkable. No intrahepatic biliary dilatation. The gallbladder contains air likely related to recent procedure. Clinical correlation recommended. No calcified gallstone or pericholecystic fluid. There is mild dilatation of the central biliary trees. The common bile duct measures 12 mm in diameter. No calcified stone noted in the central CBD. Pancreas: There is an ill-defined hypodense lesion in the body of the pancreas measuring approximately 2.5 x 1.5 cm (36/6. This is not characterized but concerning for malignancy. Further characterization with MRI without and with contrast recommended. There is an ill-defined hypodense extra pancreatic nodular density posterior to the pancreas  adjacent to the SMA likely extension of the pancreatic lesion. No active inflammatory changes of the pancreas. Spleen: Normal in size without focal abnormality. Adrenals/Urinary Tract: The adrenal glands unremarkable. Bilateral renal cortical scarring. Small right renal cysts measure up to 2 cm in the upper pole of the right kidney. There is no hydronephrosis on either side. The visualized ureters and urinary bladder appear unremarkable. Stomach/Bowel: There is severe sigmoid and diffuse colonic diverticulosis without active inflammatory changes. There is moderate stool throughout the colon. There is no bowel obstruction or active inflammation. The appendix is normal. Lymphatic: No adenopathy. Reproductive: Ill-defined posterior body uterine fibroids measure up to approximately 4 cm. No adnexal masses. Other: Right groin surgical clips. Musculoskeletal: Osteopenia with degenerative changes of the spine. No acute osseous pathology. Review of the MIP images confirms the above findings. IMPRESSION: 1. No CT evidence of pulmonary embolism or aortic dissection. 2. Ill-defined hypodense lesion in the body of the pancreas concerning for malignancy. Further characterization with MRI without and with contrast recommended. 3. Multiple bilateral pulmonary nodules measure up to 11 mm in the right upper lobe concerning for malignancy or metastatic disease. Clinical correlation and multidisciplinary consult is advised. 4. Severe colonic diverticulosis. No bowel obstruction. Normal appendix. 5. Aortic Atherosclerosis (ICD10-I70.0). Electronically Signed   By: Anner Crete M.D.   On: 12/29/2019 22:39       Subjective:  No new complaints.  Discharge Exam: Vitals:   12/31/19 0326 12/31/19 0732  BP: 115/63 97/61  Pulse: 81 96  Resp: 16 16  Temp: 97.8 F (36.6 C) 98 F (36.7 C)  SpO2: 92% 92%  Vitals:   12/30/19 2035 12/30/19 2325 12/31/19 0326 12/31/19 0732  BP: 118/86 99/77 115/63 97/61  Pulse: (!) 105 100  81 96  Resp: 17 18 16 16   Temp: 98.7 F (37.1 C) 98.5 F (36.9 C) 97.8 F (36.6 C) 98 F (36.7 C)  TempSrc: Oral Oral Oral Oral  SpO2: 95% (!) 88% 92% 92%  Weight:   66.8 kg   Height:        General: Pt is alert, awake, not in acute distress Cardiovascular: RRR, S1/S2 +, no rubs, no gallops Respiratory: CTA bilaterally, no wheezing, no rhonchi Abdominal: Soft, NT, ND, bowel sounds + Extremities: no edema, no cyanosis    The results of significant diagnostics from this hospitalization (including imaging, microbiology, ancillary and laboratory) are listed below for reference.     Microbiology: Recent Results (from the past 240 hour(s))  Respiratory Panel by RT PCR (Flu A&B, Covid) - Nasopharyngeal Swab     Status: None   Collection Time: 12/29/19 11:15 PM   Specimen: Nasopharyngeal Swab  Result Value Ref Range Status   SARS Coronavirus 2 by RT PCR NEGATIVE NEGATIVE Final    Comment: (NOTE) SARS-CoV-2 target nucleic acids are NOT DETECTED.  The SARS-CoV-2 RNA is generally detectable in upper respiratoy specimens during the acute phase of infection. The lowest concentration of SARS-CoV-2 viral copies this assay can detect is 131 copies/mL. A negative result does not preclude SARS-Cov-2 infection and should not be used as the sole basis for treatment or other patient management decisions. A negative result may occur with  improper specimen collection/handling, submission of specimen other than nasopharyngeal swab, presence of viral mutation(s) within the areas targeted by this assay, and inadequate number of viral copies (<131 copies/mL). A negative result must be combined with clinical observations, patient history, and epidemiological information. The expected result is Negative.  Fact Sheet for Patients:  PinkCheek.be  Fact Sheet for Healthcare Providers:  GravelBags.it  This test is no t yet approved or  cleared by the [place] FDA and  has been authorized for detection and/or diagnosis of SARS-CoV-2 by FDA under an Emergency Use Authorization (EUA). This EUA will remain  in effect (meaning this test can be used) for the duration of the COVID-19 declaration under Section 564(b)(1) of the Act, 21 U.S.C. section 360bbb-3(b)(1), unless the authorization is terminated or revoked sooner.     Influenza A by PCR NEGATIVE NEGATIVE Final   Influenza B by PCR NEGATIVE NEGATIVE Final    Comment: (NOTE) The Xpert Xpress SARS-CoV-2/FLU/RSV assay is intended as an aid in  the diagnosis of influenza from Nasopharyngeal swab specimens and  should not be used as a sole basis for treatment. Nasal washings and  aspirates are unacceptable for Xpert Xpress SARS-CoV-2/FLU/RSV  testing.  Fact Sheet for Patients: PinkCheek.be  Fact Sheet for Healthcare Providers: GravelBags.it  This test is not yet approved or cleared by the [place] FDA and  has been authorized for detection and/or diagnosis of SARS-CoV-2 by  FDA under an Emergency Use Authorization (EUA). This EUA will remain  in effect (meaning this test can be used) for the duration of the  Covid-19 declaration under Section 564(b)(1) of the Act, 21  U.S.C. section 360bbb-3(b)(1), unless the authorization is  terminated or revoked. Performed at [facility], [place] [address], [place],  [address]      Labs: BNP (last 3 results) Recent Labs    12/29/19 2138  BNP 95.0   Basic Metabolic Panel:  Recent Labs  Lab 12/29/19 1821 12/30/19 0511 12/31/19 0342  NA 137 138 139  K 3.7 3.8 3.6  CL 101 101 103  CO2 24 28 26   GLUCOSE 81 94 98  BUN 15 11 8   CREATININE 0.79 0.78 0.67  CALCIUM 9.0 8.9 8.8*  MG  --  1.6* 1.7  PHOS  --   --  2.9   Liver Function Tests: Recent Labs  Lab 12/29/19 1821 12/30/19 0511 12/31/19 0342  AST 16 16  --   ALT 16 17  --    ALKPHOS 52 48  --   BILITOT 1.1 1.0  --   PROT 6.0* 6.1*  --   ALBUMIN 3.7 3.7 3.3*   Recent Labs  Lab 12/29/19 1821 12/30/19 0832 12/31/19 0342  LIPASE 132* 105* 135*   No results for input(s): AMMONIA in the last 168 hours. CBC: Recent Labs  Lab 12/29/19 1821 12/30/19 0500  WBC 8.0 7.0  NEUTROABS  --  4.0  HGB 15.0 14.5  HCT 44.9 44.7  MCV 103.0* 104.7*  PLT 199 164   Cardiac Enzymes: No results for input(s): CKTOTAL, CKMB, CKMBINDEX, TROPONINI in the last 168 hours. BNP: Invalid input(s): POCBNP CBG: No results for input(s): GLUCAP in the last 168 hours. D-Dimer No results for input(s): DDIMER in the last 72 hours. Hgb A1c No results for input(s): HGBA1C in the last 72 hours. Lipid Profile No results for input(s): CHOL, HDL, LDLCALC, TRIG, CHOLHDL, LDLDIRECT in the last 72 hours. Thyroid function studies Recent Labs    12/30/19 0513  TSH 2.679   Anemia work up Recent Labs    12/31/19 0342  VITAMINB12 685  FOLATE 26.7  FERRITIN 83  TIBC 287  IRON 48  RETICCTPCT 1.8   Urinalysis    Component Value Date/Time   COLORURINE YELLOW 12/30/2019 0041   APPEARANCEUR CLEAR 12/30/2019 0041   LABSPEC >1.046 (H) 12/30/2019 0041   PHURINE 5.0 12/30/2019 0041   GLUCOSEU NEGATIVE 12/30/2019 0041   HGBUR NEGATIVE 12/30/2019 0041   BILIRUBINUR NEGATIVE 12/30/2019 0041   KETONESUR NEGATIVE 12/30/2019 0041   PROTEINUR NEGATIVE 12/30/2019 0041   NITRITE NEGATIVE 12/30/2019 0041   LEUKOCYTESUR NEGATIVE 12/30/2019 0041   Sepsis Labs Invalid input(s): PROCALCITONIN,  WBC,  LACTICIDVEN Microbiology Recent Results (from the past 240 hour(s))  Respiratory Panel by RT PCR (Flu A&B, Covid) - Nasopharyngeal Swab     Status: None   Collection Time: 12/29/19 11:15 PM   Specimen: Nasopharyngeal Swab  Result Value Ref Range Status   SARS Coronavirus 2 by RT PCR NEGATIVE NEGATIVE Final    Comment: (NOTE) SARS-CoV-2 target nucleic acids are NOT DETECTED.  The  SARS-CoV-2 RNA is generally detectable in upper respiratoy specimens during the acute phase of infection. The lowest concentration of SARS-CoV-2 viral copies this assay can detect is 131 copies/mL. A negative result does not preclude SARS-Cov-2 infection and should not be used as the sole basis for treatment or other patient management decisions. A negative result may occur with  improper specimen collection/handling, submission of specimen other than nasopharyngeal swab, presence of viral mutation(s) within the areas targeted by this assay, and inadequate number of viral copies (<131 copies/mL). A negative result must be combined with clinical observations, patient history, and epidemiological information. The expected result is Negative.  Fact Sheet for Patients:  PinkCheek.be  Fact Sheet for Healthcare Providers:  GravelBags.it  This test is no t yet approved or cleared by the [place] FDA and  has been  authorized for detection and/or diagnosis of SARS-CoV-2 by FDA under an Emergency Use Authorization (EUA). This EUA will remain  in effect (meaning this test can be used) for the duration of the COVID-19 declaration under Section 564(b)(1) of the Act, 21 U.S.C. section 360bbb-3(b)(1), unless the authorization is terminated or revoked sooner.     Influenza A by PCR NEGATIVE NEGATIVE Final   Influenza B by PCR NEGATIVE NEGATIVE Final    Comment: (NOTE) The Xpert Xpress SARS-CoV-2/FLU/RSV assay is intended as an aid in  the diagnosis of influenza from Nasopharyngeal swab specimens and  should not be used as a sole basis for treatment. Nasal washings and  aspirates are unacceptable for Xpert Xpress SARS-CoV-2/FLU/RSV  testing.  Fact Sheet for Patients: PinkCheek.be  Fact Sheet for Healthcare Providers: GravelBags.it  This test is not yet approved or cleared  by the [place] FDA and  has been authorized for detection and/or diagnosis of SARS-CoV-2 by  FDA under an Emergency Use Authorization (EUA). This EUA will remain  in effect (meaning this test can be used) for the duration of the  Covid-19 declaration under Section 564(b)(1) of the Act, 21  U.S.C. section 360bbb-3(b)(1), unless the authorization is  terminated or revoked. Performed at [facility], [place] [address], [place], [place] [address]      Time coordinating discharge: 36 minutes.  SIGNED:   [name], MD  [facility] 12/31/2019, 11:42 AM

## 2019-12-31 NOTE — Progress Notes (Signed)
D/C instructions given and reviewed. No questions asked but encouraged to call with any concerns. Tele and IV's removed. Tolerated well.  

## 2020-01-01 LAB — URINE CULTURE: Culture: <10000 — AB

## 2020-01-01 NOTE — Anesthesia Preprocedure Evaluation (Addendum)
Anesthesia Evaluation  Patient identified by MRN, date of birth, ID band Patient awake    Reviewed: Allergy & Precautions, NPO status , Patient's Chart, lab work & pertinent test results  Airway Mallampati: I  TM Distance: >3 FB Neck ROM: Full    Dental no notable dental hx. (+) Teeth Intact, Dental Advisory Given   Pulmonary COPD,  COPD inhaler, Current Smoker and Patient abstained from smoking.,  1/2ppd advair- used this morning   Pulmonary exam normal breath sounds clear to auscultation       Cardiovascular hypertension, Pt. on medications + CAD  Normal cardiovascular exam+ dysrhythmias (hx Aflutter, eliquis and diltiazem) Atrial Fibrillation  Rhythm:Regular Rate:Normal  Echo 12/30/19: 1. Left ventricular ejection fraction, by estimation, is 50 to 55%. The  left ventricle has low normal function. The left ventricle has no regional  wall motion abnormalities. Left ventricular diastolic parameters are  indeterminate.  2. Right ventricular systolic function is low normal. The right  ventricular size is mildly enlarged. Mildly increased right ventricular  wall thickness.  3. Left atrial size was severely dilated.  4. The mitral valve is grossly normal. Trivial mitral valve  regurgitation.  5. The aortic valve is tricuspid. Aortic valve regurgitation is not  visualized.  6. The inferior vena cava is dilated in size with <50% respiratory  variability, suggesting right atrial pressure of 15 mmHg.    Was admitted 10/18-10/21 for new onset Aflutter discovered in PCP office  eliquis last dose 2 days ago   Neuro/Psych negative neurological ROS  negative psych ROS   GI/Hepatic Neg liver ROS, GERD  Controlled,Pancreatic mass w/ lung nodules   Endo/Other  negative endocrine ROS  Renal/GU negative Renal ROS  negative genitourinary   Musculoskeletal  (+) Arthritis , Osteoarthritis,    Abdominal   Peds   Hematology negative hematology ROS (+)   Anesthesia Other Findings Has tooth abscess that started yesterday R upper gums inflamed  Reproductive/Obstetrics negative OB ROS                            Anesthesia Physical Anesthesia Plan  ASA: III  Anesthesia Plan: MAC   Post-op Pain Management:    Induction:   PONV Risk Score and Plan: 2 and Propofol infusion and TIVA  Airway Management Planned: Natural Airway and Simple Face Mask  Additional Equipment: None  Intra-op Plan:   Post-operative Plan:   Informed Consent: I have reviewed the patients History and Physical, chart, labs and discussed the procedure including the risks, benefits and alternatives for the proposed anesthesia with the patient or authorized representative who has indicated his/her understanding and acceptance.       Plan Discussed with: CRNA  Anesthesia Plan Comments:        Anesthesia Quick Evaluation

## 2020-01-02 ENCOUNTER — Ambulatory Visit (HOSPITAL_COMMUNITY): Payer: Medicare Other | Admitting: Certified Registered Nurse Anesthetist

## 2020-01-02 ENCOUNTER — Encounter (HOSPITAL_COMMUNITY): Payer: Self-pay | Admitting: Gastroenterology

## 2020-01-02 ENCOUNTER — Ambulatory Visit (HOSPITAL_COMMUNITY)
Admission: RE | Admit: 2020-01-02 | Discharge: 2020-01-02 | Disposition: A | Discharge status: Home / Self Care | Payer: Medicare Other | Attending: Gastroenterology | Admitting: Gastroenterology

## 2020-01-02 ENCOUNTER — Encounter (HOSPITAL_COMMUNITY)
Admission: RE | Disposition: A | Discharge status: Home / Self Care | Payer: Self-pay | Source: Home / Self Care | Attending: Gastroenterology

## 2020-01-02 ENCOUNTER — Other Ambulatory Visit: Payer: Self-pay

## 2020-01-02 DIAGNOSIS — D259 Leiomyoma of uterus, unspecified: Secondary | ICD-10-CM | POA: Insufficient documentation

## 2020-01-02 DIAGNOSIS — I708 Atherosclerosis of other arteries: Secondary | ICD-10-CM | POA: Insufficient documentation

## 2020-01-02 DIAGNOSIS — Z9049 Acquired absence of other specified parts of digestive tract: Secondary | ICD-10-CM | POA: Insufficient documentation

## 2020-01-02 DIAGNOSIS — R918 Other nonspecific abnormal finding of lung field: Secondary | ICD-10-CM | POA: Diagnosis not present

## 2020-01-02 DIAGNOSIS — E785 Hyperlipidemia, unspecified: Secondary | ICD-10-CM | POA: Diagnosis not present

## 2020-01-02 DIAGNOSIS — M8580 Other specified disorders of bone density and structure, unspecified site: Secondary | ICD-10-CM | POA: Insufficient documentation

## 2020-01-02 DIAGNOSIS — K219 Gastro-esophageal reflux disease without esophagitis: Secondary | ICD-10-CM | POA: Insufficient documentation

## 2020-01-02 DIAGNOSIS — I509 Heart failure, unspecified: Secondary | ICD-10-CM | POA: Diagnosis not present

## 2020-01-02 DIAGNOSIS — I2510 Atherosclerotic heart disease of native coronary artery without angina pectoris: Secondary | ICD-10-CM | POA: Diagnosis not present

## 2020-01-02 DIAGNOSIS — K862 Cyst of pancreas: Secondary | ICD-10-CM | POA: Insufficient documentation

## 2020-01-02 DIAGNOSIS — J449 Chronic obstructive pulmonary disease, unspecified: Secondary | ICD-10-CM | POA: Insufficient documentation

## 2020-01-02 DIAGNOSIS — I25119 Atherosclerotic heart disease of native coronary artery with unspecified angina pectoris: Secondary | ICD-10-CM | POA: Diagnosis not present

## 2020-01-02 DIAGNOSIS — Z8249 Family history of ischemic heart disease and other diseases of the circulatory system: Secondary | ICD-10-CM | POA: Insufficient documentation

## 2020-01-02 DIAGNOSIS — Z882 Allergy status to sulfonamides status: Secondary | ICD-10-CM | POA: Insufficient documentation

## 2020-01-02 DIAGNOSIS — F17200 Nicotine dependence, unspecified, uncomplicated: Secondary | ICD-10-CM | POA: Insufficient documentation

## 2020-01-02 DIAGNOSIS — Z881 Allergy status to other antibiotic agents status: Secondary | ICD-10-CM | POA: Diagnosis not present

## 2020-01-02 DIAGNOSIS — I700 Atherosclerosis of aorta: Secondary | ICD-10-CM | POA: Diagnosis not present

## 2020-01-02 DIAGNOSIS — K5730 Diverticulosis of large intestine without perforation or abscess without bleeding: Secondary | ICD-10-CM | POA: Insufficient documentation

## 2020-01-02 DIAGNOSIS — I110 Hypertensive heart disease with heart failure: Secondary | ICD-10-CM | POA: Insufficient documentation

## 2020-01-02 DIAGNOSIS — K8689 Other specified diseases of pancreas: Secondary | ICD-10-CM | POA: Diagnosis present

## 2020-01-02 DIAGNOSIS — F17210 Nicotine dependence, cigarettes, uncomplicated: Secondary | ICD-10-CM | POA: Diagnosis not present

## 2020-01-02 DIAGNOSIS — I4891 Unspecified atrial fibrillation: Secondary | ICD-10-CM | POA: Diagnosis not present

## 2020-01-02 DIAGNOSIS — I4892 Unspecified atrial flutter: Secondary | ICD-10-CM | POA: Insufficient documentation

## 2020-01-02 DIAGNOSIS — M1990 Unspecified osteoarthritis, unspecified site: Secondary | ICD-10-CM | POA: Diagnosis not present

## 2020-01-02 HISTORY — PX: UPPER ESOPHAGEAL ENDOSCOPIC ULTRASOUND (EUS): SHX6562

## 2020-01-02 HISTORY — PX: FINE NEEDLE ASPIRATION: SHX5430

## 2020-01-02 HISTORY — PX: ESOPHAGOGASTRODUODENOSCOPY (EGD) WITH PROPOFOL: SHX5813

## 2020-01-02 SURGERY — UPPER ESOPHAGEAL ENDOSCOPIC ULTRASOUND (EUS)
Anesthesia: Monitor Anesthesia Care

## 2020-01-02 MED ORDER — PROPOFOL 500 MG/50ML IV EMUL
INTRAVENOUS | Status: DC | PRN
  Administered 2020-01-02: 100 ug/kg/min via INTRAVENOUS

## 2020-01-02 MED ORDER — LACTATED RINGERS IV SOLN: INTRAVENOUS | Status: DC | PRN

## 2020-01-02 MED ORDER — LIDOCAINE 2% (20 MG/ML) 5 ML SYRINGE
INTRAMUSCULAR | Status: DC | PRN
  Administered 2020-01-02: 80 mg via INTRAVENOUS

## 2020-01-02 MED ORDER — PROPOFOL 10 MG/ML IV BOLUS
INTRAVENOUS | Status: AC
  Filled 2020-01-02: qty 20

## 2020-01-02 MED ORDER — PROPOFOL 500 MG/50ML IV EMUL
INTRAVENOUS | Status: AC
  Filled 2020-01-02: qty 50

## 2020-01-02 MED ORDER — PROPOFOL 10 MG/ML IV BOLUS
INTRAVENOUS | Status: DC | PRN
  Administered 2020-01-02 (×2): 30 mg via INTRAVENOUS
  Administered 2020-01-02: 10 mg via INTRAVENOUS

## 2020-01-02 MED ORDER — SODIUM CHLORIDE 0.9 % IV SOLN: INTRAVENOUS | Status: DC

## 2020-01-02 NOTE — Progress Notes (Signed)
Dr Doroteo Glassman from anesthesia at bedside assessing EKG. She's okl with current rhythm/rate. Pt instructed to take home dose of cardizem as soon as gets home. Pt agrees.

## 2020-01-02 NOTE — Op Note (Signed)
[facility] Patient Name: Rebecca Weaver Procedure Date: 01/02/2020 MRN: [id number] Attending MD: [name] , MD Date of Birth: 1950-07-30 CSN: [id number] Age: 69 Admit Type: Outpatient Procedure:                Upper EUS Indications:              Suspected mass in pancreas on MRI Providers:                [name], MD, Cleda Daub, RN, Fransico Setters                            Mbumina, Technician Referring MD:              Medicines:                Propofol per Anesthesia Complications:            No immediate complications. Estimated Blood Loss:     Estimated blood loss: none. Procedure:                Pre-Anesthesia Assessment:                           - Prior to the procedure, a History and Physical                            was performed, and patient medications and                            allergies were reviewed. The patient's tolerance of                            previous anesthesia was also reviewed. The risks                            and benefits of the procedure and the sedation                            options and risks were discussed with the patient.                            All questions were answered, and informed consent                            was obtained. Prior Anticoagulants: The patient has                            taken Eliquis (apixaban), last dose was 2 days                            prior to procedure. ASA Grade Assessment: II - A                            patient with mild systemic disease. After reviewing  the risks and benefits, the patient was deemed in                            satisfactory condition to undergo the procedure.                           - Sedation was administered by an anesthesia                            professional. Deep sedation was attained.                           After obtaining informed consent, the endoscope was                            passed under direct  vision. Throughout the                            procedure, the patient's blood pressure, pulse, and                            oxygen saturations were monitored continuously. The                            [id number] ([id number]) Olympus Linear EUS was                            introduced through the mouth, and advanced to the                            second part of duodenum. The upper EUS was                            accomplished without difficulty. The patient                            tolerated the procedure well. Scope In: Scope Out: Findings:      ENDOSONOGRAPHIC FINDING: :      An irregular mass was identified in the pancreatic body. The mass was       hypoechoic. The mass measured 28 mm by 22 mm in maximal cross-sectional       diameter. The endosonographic borders were poorly-defined. An intact       interface was seen between the mass and the portal vein suggesting a       lack of invasion. The remainder of the pancreas was examined. The       endosonographic appearance of parenchyma and the upstream pancreatic       duct indicated a maximum duct diameter of 3 mm and parenchymal atrophy.      Pancreatic parenchymal abnormalities were noted in the entire pancreas.       These consisted of atrophy and lobularity.      An anechoic lesion suggestive of a cyst was identified in the pancreatic       body. It is not in obvious communication with the pancreatic duct. The  lesion measured 9 mm by 6 mm in maximal cross-sectional diameter. There       was a single compartment without septae. The outer wall of the lesion       was not seen. There was no internal debris within the fluid-filled       cavity.      The pancreatic parenchyma was noted to be hypoechoic, atrophied, and       lobular. The PD appeared to be normal in caliber throughout the entire       pancreas. In the right side of the pancreas a 28 x 22 mm mildly       hypoechic irregular mass was identified. FNA was not  possible as the       portal vein/portal confluence was in close proximity to the lesion and       there were other smaller caliber vessels around. A safe window for FNA       was not identified. There was no evidence of any invasion into the       portal vein?confluence. A small cystic lesion was found in the body of       the pancreas. 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 The CBD was measured to be 6 mm and there was no       evidence stones or sludge. She is absent a gallbladder. The SMA was       viewed, but the celiac axis appeared to be normal. Atherosclerosis was       found in the aorta. The left adrenal was normal. Impression:               - A mass was identified in the pancreatic body.                           - Pancreatic parenchymal abnormalities consisting                            of atrophy and lobularity were noted in the entire                            pancreas.                           - A cystic lesion was seen in the pancreatic body.                           - No specimens collected. Moderate Sedation:      Not Applicable - Patient had care per Anesthesia. Recommendation:           - Patient has a contact number available for                            emergencies. The signs and symptoms of potential                            delayed complications were discussed with the                            patient. Return  to normal activities tomorrow.                            Written discharge instructions were provided to the                            patient.                           - Resume previous diet.                           - Resume Eliquis.                           - Obtain IR consultation for FNA. Procedure Code(s):        --- Professional ---                           [phone], Esophagogastroduodenoscopy, flexible,                            transoral; with endoscopic ultrasound examination                             limited to the esophagus, stomach or duodenum, and                            adjacent structures Diagnosis Code(s):        --- Professional ---                           K86.89, Other specified diseases of pancreas                           K86.9, Disease of pancreas, unspecified                           K86.2, Cyst of pancreas                           R93.3, Abnormal findings on diagnostic imaging of                            other parts of digestive tract CPT copyright 2019 [employer]. All rights reserved. The codes documented in this report are preliminary and upon coder review may  be revised to meet current compliance requirements. [name], MD [name], MD 01/02/2020 10:36:17 AM This report has been signed electronically. Number of Addenda: 0

## 2020-01-02 NOTE — Interval H&P Note (Signed)
History and Physical Interval Note:  01/02/2020 9:14 AM  Rebecca Weaver  has presented today for surgery, with the diagnosis of pancreatic mass.  The various methods of treatment have been discussed with the patient and family. After consideration of risks, benefits and other options for treatment, the patient has consented to  Procedure(s): UPPER ESOPHAGEAL ENDOSCOPIC ULTRASOUND (EUS) (N/A) FINE NEEDLE ASPIRATION (FNA) LINEAR (N/A) as a surgical intervention.  The patient's history has been reviewed, patient examined, no change in status, stable for surgery.  I have reviewed the patient's chart and labs.  Questions were answered to the patient's satisfaction.     Nury Nebergall D

## 2020-01-02 NOTE — Transfer of Care (Signed)
Immediate Anesthesia Transfer of Care Note  Patient: Rebecca Weaver  Procedure(s) Performed: UPPER ESOPHAGEAL ENDOSCOPIC ULTRASOUND (EUS) (N/A ) FINE NEEDLE ASPIRATION (FNA) LINEAR (N/A )  Patient Location: Endoscopy Unit  Anesthesia Type:MAC  Level of Consciousness: drowsy and patient cooperative  Airway & Oxygen Therapy: Patient Spontanous Breathing and Patient connected to nasal cannula oxygen  Post-op Assessment: Report given to RN and Post -op Vital signs reviewed and stable  Post vital signs: Reviewed and stable  Last Vitals:  Vitals Value Taken Time  BP    Temp    Pulse    Resp    SpO2      Last Pain:  Vitals:   01/02/20 0851  TempSrc: Oral         Complications: No complications documented.

## 2020-01-02 NOTE — Anesthesia Postprocedure Evaluation (Signed)
Anesthesia Post Note  Patient: Rebecca Weaver  Procedure(s) Performed: UPPER ESOPHAGEAL ENDOSCOPIC ULTRASOUND (EUS) (N/A ) FINE NEEDLE ASPIRATION (FNA) LINEAR (N/A )     Patient location during evaluation: PACU Anesthesia Type: MAC Level of consciousness: awake and alert Pain management: pain level controlled Vital Signs Assessment: post-procedure vital signs reviewed and stable Respiratory status: spontaneous breathing, nonlabored ventilation and respiratory function stable Cardiovascular status: blood pressure returned to baseline, stable and tachycardic Postop Assessment: no apparent nausea or vomiting Anesthetic complications: no Comments: Tachycardic at baseline, missed morning dose of cardizem- will take when she gets home    No complications documented.  Last Vitals:  Vitals:   01/02/20 1023 01/02/20 1035  BP: 122/79 122/81  Pulse: (!) 130   Resp:    Temp: 36.7 C   SpO2: 100%     Last Pain:  Vitals:   01/02/20 1035  TempSrc:   PainSc: [place]

## 2020-01-02 NOTE — Discharge Instructions (Signed)

## 2020-01-05 ENCOUNTER — Other Ambulatory Visit (HOSPITAL_COMMUNITY): Payer: Self-pay | Admitting: Gastroenterology

## 2020-01-05 ENCOUNTER — Encounter (HOSPITAL_COMMUNITY): Payer: Self-pay | Admitting: Gastroenterology

## 2020-01-05 DIAGNOSIS — K8689 Other specified diseases of pancreas: Secondary | ICD-10-CM

## 2020-01-06 ENCOUNTER — Ambulatory Visit: Payer: Medicare Other | Admitting: Cardiovascular Disease

## 2020-01-06 ENCOUNTER — Encounter (HOSPITAL_COMMUNITY): Payer: Self-pay | Admitting: Radiology

## 2020-01-06 NOTE — Progress Notes (Signed)
Rebecca Weaver. [place] Female, 69 y.o., 1950/06/23 MRN:  [id number] Phone:  [phone] [name]) PCP:  Nickola Major, MD Primary Cvg:  Medicare/Medicare Part A And B Next Appt With Cardiology 01/14/2020 at 10:40 AM  RE: CT Biopsy Received: Today Arne Cleveland, MD  [name], MD Cc: [name] THere is no posterior or lateral approach on CT. Best approach from anterior but prob safer under EUS since we can't see vessels on noncontrast CT. We could try FNA only.  @[name] : ok to sched CT FNA panc mass   [name]       Previous Messages   ----- Message -----  From: [name], MD  Sent: 01/06/2020  1:32 PM EDT  To: Arne Cleveland, MD  Subject: RE: CT Biopsy                   [name],   I did not biopsy the pancreatic lesion. There was no safe window for me to biopsy. My office should have consulted you for biopsy of the pancreatic lesion.   [name]  ----- Message -----  From: Arne Cleveland, MD  Sent: 01/06/2020 12:25 PM EDT  To: [name], MD  Subject: FW: CT Biopsy                   [name]:  I see you did EUS but no FNA? Seems like that would be the best approach for biopsy. Lung nodules are high risk given her COPD and their small size. Nothing great for IR biopsy.   Thx  [name]  ----- Message -----  From: [name]  Sent: 01/05/2020  6:54 PM EDT  To: Ir Procedure Requests  Subject: CT Biopsy                     Procedure: CT Biopsy   Reason: Pancreatic head mass   History: Upper EUS, CT, MRI in computer   Provider: [name]   Provider Contact: [phone]

## 2020-01-06 NOTE — Progress Notes (Signed)
Rebecca Weaver. [place] Female, 69 y.o., 1950/12/25 MRN:  [id number] Phone:  [phone] [name]) PCP:  Nickola Major, MD Primary Cvg:  Medicare/Medicare Part A And B Next Appt With Cardiology 01/14/2020 at 10:40 AM  RE: CT Biopsy Received: Today [name], MD  [name] I was not the one to prescribe the medication, but I believe it should be fine. I had her hold the medication for two days before my attempted EUS with FNA.   From my standpoint, it is okay to hold her Eliquis.   Dr. [name].       Previous Messages   ----- Message -----  From: [name]  Sent: 01/05/2020  6:57 PM EDT  To: [name], MD, [name], MD  Subject: FW: CT Biopsy                   Patient is on Eliquis and will need to hold for 2 days prior to her Biopsy. Please advise if okay to hold. Thanks [name]  ----- Message -----  From: [name]  Sent: 01/05/2020  6:54 PM EDT  To: Ir Procedure Requests  Subject: CT Biopsy                     Procedure: CT Biopsy   Reason: Pancreatic head mass   History: Upper EUS, CT, MRI in computer   Provider: [name]   Provider Contact: [phone]

## 2020-01-08 ENCOUNTER — Other Ambulatory Visit: Payer: Self-pay | Admitting: Radiology

## 2020-01-09 ENCOUNTER — Other Ambulatory Visit: Payer: Self-pay

## 2020-01-09 ENCOUNTER — Encounter (HOSPITAL_COMMUNITY): Payer: Self-pay

## 2020-01-09 ENCOUNTER — Ambulatory Visit (HOSPITAL_COMMUNITY)
Admission: RE | Admit: 2020-01-09 | Discharge: 2020-01-09 | Disposition: A | Discharge status: Home / Self Care | Payer: Medicare Other | Source: Ambulatory Visit | Attending: Gastroenterology | Admitting: Gastroenterology

## 2020-01-09 DIAGNOSIS — C259 Malignant neoplasm of pancreas, unspecified: Secondary | ICD-10-CM | POA: Diagnosis not present

## 2020-01-09 DIAGNOSIS — K8689 Other specified diseases of pancreas: Secondary | ICD-10-CM

## 2020-01-09 DIAGNOSIS — Z7901 Long term (current) use of anticoagulants: Secondary | ICD-10-CM | POA: Diagnosis not present

## 2020-01-09 DIAGNOSIS — F17200 Nicotine dependence, unspecified, uncomplicated: Secondary | ICD-10-CM | POA: Diagnosis not present

## 2020-01-09 LAB — CBC
HCT: 48.6 % — ABNORMAL HIGH (ref 36.0–46.0)
Hemoglobin: 16.1 g/dL — ABNORMAL HIGH (ref 12.0–15.0)
MCH: 34.4 pg — ABNORMAL HIGH (ref 26.0–34.0)
MCHC: 33.1 g/dL (ref 30.0–36.0)
MCV: 103.8 fL — ABNORMAL HIGH (ref 80.0–100.0)
Platelets: 293 10*3/uL (ref 150–400)
RBC: 4.68 MIL/uL (ref 3.87–5.11)
RDW: 12.7 % (ref 11.5–15.5)
WBC: 8.5 10*3/uL (ref 4.0–10.5)
nRBC: 0 % (ref 0.0–0.2)

## 2020-01-09 LAB — PROTIME-INR
INR: 1.1 (ref 0.8–1.2)
Prothrombin Time: 13.4 seconds (ref 11.4–15.2)

## 2020-01-09 MED ORDER — KETOROLAC TROMETHAMINE 30 MG/ML IJ SOLN
INTRAMUSCULAR | Status: AC
  Filled 2020-01-09: qty 1

## 2020-01-09 MED ORDER — KETOROLAC TROMETHAMINE 15 MG/ML IJ SOLN
INTRAMUSCULAR | Status: AC | PRN
  Administered 2020-01-09: 15 mg via INTRAVENOUS

## 2020-01-09 MED ORDER — FENTANYL CITRATE (PF) 100 MCG/2ML IJ SOLN
INTRAMUSCULAR | Status: AC
  Filled 2020-01-09: qty 2

## 2020-01-09 MED ORDER — MIDAZOLAM HCL 2 MG/2ML IJ SOLN
INTRAMUSCULAR | Status: AC
  Filled 2020-01-09: qty 2

## 2020-01-09 MED ORDER — MIDAZOLAM HCL 2 MG/2ML IJ SOLN
INTRAMUSCULAR | Status: AC | PRN
  Administered 2020-01-09 (×4): 0.5 mg via INTRAVENOUS

## 2020-01-09 MED ORDER — FENTANYL CITRATE (PF) 100 MCG/2ML IJ SOLN
INTRAMUSCULAR | Status: AC | PRN
Start: 2020-01-09 — End: 2020-01-09
  Administered 2020-01-09 (×4): 25 ug via INTRAVENOUS

## 2020-01-09 MED ORDER — LIDOCAINE HCL 1 % IJ SOLN
INTRAMUSCULAR | Status: AC
  Filled 2020-01-09: qty 20

## 2020-01-09 MED ORDER — SODIUM CHLORIDE 0.9 % IV SOLN: INTRAVENOUS | Status: DC

## 2020-01-09 NOTE — Discharge Instructions (Addendum)
Needle Biopsy, Care After These instructions tell you how to care for yourself after your procedure. Your doctor may also give you more specific instructions. Call your doctor if you have any problems or questions. What can I expect after the procedure? After the procedure, it is common to have:  Soreness.  Bruising.  Mild pain. Follow these instructions at home:   Return to your normal activities as told by your doctor. Ask your doctor what activities are safe for you.  Take over-the-counter and prescription medicines only as told by your doctor.  Wash your hands with soap and water before you change your bandage (dressing). If you cannot use soap and water, use hand sanitizer.  Follow instructions from your doctor about: ? How to take care of your puncture site. ? When and how to change your bandage. ? When to remove your bandage.  Check your puncture site every day for signs of infection. Watch for: ? Redness, swelling, or pain. ? Fluid or blood. ? Pus or a bad smell. ? Warmth.  Do not take baths, swim, or use a hot tub until your doctor approves. Ask your doctor if you may take showers. You may only be allowed to take sponge baths.  Keep all follow-up visits as told by your doctor. This is important. Contact a doctor if you have:  A fever.  Redness, swelling, or pain at the puncture site, and it lasts longer than a few days.  Fluid, blood, or pus coming from the puncture site.  Warmth coming from the puncture site. Get help right away if:  You have a lot of bleeding from the puncture site. Summary  After the procedure, it is common to have soreness, bruising, or mild pain at the puncture site.  Check your puncture site every day for signs of infection, such as redness, swelling, or pain.  Get help right away if you have severe bleeding from your puncture site. This information is not intended to replace advice given to you by your health care provider. Make  sure you discuss any questions you have with your health care provider. Document Revised: 03/12/2017 Document Reviewed: 03/12/2017 Elsevier Patient Education  2020 Elsevier Inc. Moderate Conscious Sedation, Adult Sedation is the use of medicines to promote relaxation and relieve discomfort and anxiety. Moderate conscious sedation is a type of sedation. Under moderate conscious sedation, you are less alert than normal, but you are still able to respond to instructions, touch, or both. Moderate conscious sedation is used during short medical and dental procedures. It is milder than deep sedation, which is a type of sedation under which you cannot be easily woken up. It is also milder than general anesthesia, which is the use of medicines to make you unconscious. Moderate conscious sedation allows you to return to your regular activities sooner. Tell a health care provider about:  Any allergies you have.  All medicines you are taking, including vitamins, herbs, eye drops, creams, and over-the-counter medicines.  Use of steroids (by mouth or creams).  Any problems you or family members have had with sedatives and anesthetic medicines.  Any blood disorders you have.  Any surgeries you have had.  Any medical conditions you have, such as sleep apnea.  Whether you are pregnant or may be pregnant.  Any use of cigarettes, alcohol, marijuana, or street drugs. What are the risks? Generally, this is a safe procedure. However, problems may occur, including:  Getting too much medicine (oversedation).  Nausea.  Allergic reaction to   medicines.  Trouble breathing. If this happens, a breathing tube may be used to help with breathing. It will be removed when you are awake and breathing on your own.  Heart trouble.  Lung trouble. What happens before the procedure? Staying hydrated Follow instructions from your health care provider about hydration, which may include:  Up to 2 hours before the  procedure - you may continue to drink clear liquids, such as water, clear fruit juice, black coffee, and plain tea. Eating and drinking restrictions Follow instructions from your health care provider about eating and drinking, which may include:  8 hours before the procedure - stop eating heavy meals or foods such as meat, fried foods, or fatty foods.  6 hours before the procedure - stop eating light meals or foods, such as toast or cereal.  6 hours before the procedure - stop drinking milk or drinks that contain milk.  2 hours before the procedure - stop drinking clear liquids. Medicine Ask your health care provider about:  Changing or stopping your regular medicines. This is especially important if you are taking diabetes medicines or blood thinners.  Taking medicines such as aspirin and ibuprofen. These medicines can thin your blood. Do not take these medicines before your procedure if your health care provider instructs you not to.  Tests and exams  You will have a physical exam.  You may have blood tests done to show: ? How well your kidneys and liver are working. ? How well your blood can clot. General instructions  Plan to have someone take you home from the hospital or clinic.  If you will be going home right after the procedure, plan to have someone with you for 24 hours. What happens during the procedure?  An IV tube will be inserted into one of your veins.  Medicine to help you relax (sedative) will be given through the IV tube.  The medical or dental procedure will be performed. What happens after the procedure?  Your blood pressure, heart rate, breathing rate, and blood oxygen level will be monitored often until the medicines you were given have worn off.  Do not drive for 24 hours. This information is not intended to replace advice given to you by your health care provider. Make sure you discuss any questions you have with your health care provider. Document  Revised: 02/09/2017 Document Reviewed: 06/19/2015 Elsevier Patient Education  2020 Elsevier Inc.  

## 2020-01-09 NOTE — H&P (Signed)
Chief Complaint: Patient was seen in consultation today for pancreatic mass biopsy at the request of Hung,Patrick  Referring Physician(s): Hung,Patrick  Supervising Physician: Corrie Mckusick  Patient Status: [facility] - Out-pt  History of Present Illness: Rebecca Weaver is a 69 y.o. female being worked up for pancreatic mass biopsy. Underwent EUS but was not able to get FNA during that procedure. IR has been asked to eval for possible perc FNA under image guidance. PMHx, meds, labs, imaging, allergies reviewed. Feels well, no recent fevers, chills, illness. Has been NPO today as directed.    Past Medical History:  Diagnosis Date  . Anemia   . Arthritis   . COPD (chronic obstructive pulmonary disease) ([place])   . Coronary artery calcification   . GERD (gastroesophageal reflux disease)   . Hyperlipidemia   . Hypertension   . Lung nodules   . Osteopenia   . Pseudotumor cerebri     Past Surgical History:  Procedure Laterality Date  . CHOLECYSTECTOMY    . ESOPHAGOGASTRODUODENOSCOPY (EGD) WITH PROPOFOL N/A 01/02/2020   Procedure: ESOPHAGOGASTRODUODENOSCOPY (EGD) WITH PROPOFOL;  Surgeon: [name], MD;  Location: [facility] ENDOSCOPY;  Service: Endoscopy;  Laterality: N/A;  . FINE NEEDLE ASPIRATION N/A 01/02/2020   Procedure: FINE NEEDLE ASPIRATION (FNA) LINEAR;  Surgeon: [name], MD;  Location: [facility] ENDOSCOPY;  Service: Endoscopy;  Laterality: N/A;  . TUBAL LIGATION    . UPPER ESOPHAGEAL ENDOSCOPIC ULTRASOUND (EUS) N/A 01/02/2020   Procedure: UPPER ESOPHAGEAL ENDOSCOPIC ULTRASOUND (EUS);  Surgeon: [name], MD;  Location: [facility];  Service: Endoscopy;  Laterality: N/A;  . VARICOSE VEIN SURGERY     multiple    Allergies: Atorvastatin, Ciprofloxacin, and Sulfa antibiotics  Medications: Prior to Admission medications   Medication Sig Start Date End Date Taking? Authorizing Provider  acetaminophen (TYLENOL) 500 MG tablet Take 1,000 mg by mouth every 6 (six) hours  as needed for moderate pain or headache.   Yes [provider]  amoxicillin (AMOXIL) 500 MG tablet Take 500 mg by mouth every 8 (eight) hours.   Yes [provider]  apixaban (ELIQUIS) 5 MG TABS tablet Take 1 tablet (5 mg total) by mouth 2 (two) times daily. Do not start this until you have the pancreatic biopsy done by GI. 01/05/20 07/03/20 Yes [name], MD  benazepril (LOTENSIN) 20 MG tablet Take 20 mg by mouth daily.   Yes [provider]  Cyanocobalamin (B-12 COMPLIANCE INJECTION IJ) Inject 1 Dose as directed every 30 (thirty) days.   Yes [provider]  diltiazem (CARDIZEM CD) 120 MG 24 hr capsule Take 1 capsule (120 mg total) by mouth daily. 12/31/19 06/28/20 Yes Mercy Riding, MD  ezetimibe (ZETIA) 10 MG tablet Take 10 mg by mouth daily.   Yes [provider]  famotidine (PEPCID) 20 MG tablet Take 20 mg by mouth daily.   Yes [provider]  fluticasone (FLONASE) 50 MCG/ACT nasal spray Place 1 spray into both nostrils 2 (two) times daily.    Yes [provider]  Fluticasone-Salmeterol (ADVAIR) 250-50 MCG/DOSE AEPB Inhale 1 puff into the lungs 2 (two) times daily.   Yes [provider]  loratadine (CLARITIN) 10 MG tablet Take 10 mg by mouth daily.   Yes [provider]  Multiple Vitamin (MULTIVITAMIN WITH MINERALS) TABS tablet Take 1 tablet by mouth daily.   Yes [provider]  Naphazoline-Pheniramine (VISINE-A OP) Place 1 drop into both eyes daily.   Yes [provider]  ondansetron (ZOFRAN) 4  MG tablet Take 1 tablet (4 mg total) by mouth every 8 (eight) hours as needed for nausea or vomiting. Patient not taking: Reported on 01/07/2020 12/30/19 12/29/20  Mercy Riding, MD     Family History  Problem Relation Age of Onset  . Varicose Veins Father   . Varicose Veins Sister     Social History   Socioeconomic History  . Marital status: Married    Spouse name: Not on file  . Number of  children: Not on file  . Years of education: Not on file  . Highest education level: Not on file  Occupational History  . Not on file  Tobacco Use  . Smoking status: Current Every Day Smoker    Packs/day: 0.75    Types: Cigarettes  . Smokeless tobacco: Never Used  Substance and Sexual Activity  . Alcohol use: Yes    Alcohol/week: 0.0 standard drinks    Comment: social occassions only  . Drug use: Not on file  . Sexual activity: Not on file  Other Topics Concern  . Not on file  Social History Narrative  . Not on file   Social Determinants of Health   Financial Resource Strain:   . Difficulty of Paying Living Expenses: Not on file  Food Insecurity:   . Worried About Charity fundraiser in the Last Year: Not on file  . Ran Out of Food in the Last Year: Not on file  Transportation Needs:   . Lack of Transportation (Medical): Not on file  . Lack of Transportation (Non-Medical): Not on file  Physical Activity:   . Days of Exercise per Week: Not on file  . Minutes of Exercise per Session: Not on file  Stress:   . Feeling of Stress : Not on file  Social Connections:   . Frequency of Communication with Friends and Family: Not on file  . Frequency of Social Gatherings with Friends and Family: Not on file  . Attends Religious Services: Not on file  . Active Member of Clubs or Organizations: Not on file  . Attends Archivist Meetings: Not on file  . Marital Status: Not on file     Review of Systems: A 12 point ROS discussed and pertinent positives are indicated in the HPI above.  All other systems are negative.  Review of Systems  Vital Signs: BP (!) 133/100   Temp 98.5 F (36.9 C) (Oral)   Resp 18   Ht 5\' 7"  (1.702 m)   Wt 64.9 kg   SpO2 99%   BMI 22.40 kg/m   Physical Exam Constitutional:      Appearance: Normal appearance. She is not ill-appearing.  HENT:     Mouth/Throat:     Mouth: Mucous membranes are moist.     Pharynx: Oropharynx is clear.    Cardiovascular:     Rate and Rhythm: Normal rate and regular rhythm.     Pulses: Normal pulses.     Heart sounds: Normal heart sounds.  Pulmonary:     Effort: Pulmonary effort is normal. No respiratory distress.     Breath sounds: Normal breath sounds.  Skin:    General: Skin is warm and dry.  Neurological:     General: No focal deficit present.     Mental Status: She is alert and oriented to person, place, and time.  Psychiatric:        Mood and Affect: Mood normal.        Thought Content: Thought  content normal.        Judgment: Judgment normal.     Imaging: DG Chest 2 View  Result Date: 12/29/2019 CLINICAL DATA:  Tachycardia EXAM: CHEST - 2 VIEW COMPARISON:  06/06/2016, CT 12/04/2018 FINDINGS: Emphysematous disease and hyperinflation. No acute consolidation or pleural effusion. Stable cardiomediastinal silhouette with prominent central pulmonary arteries. Aortic atherosclerosis. No pneumothorax. IMPRESSION: No active cardiopulmonary disease. Emphysematous disease. Electronically Signed   By: Donavan Foil M.D.   On: 12/29/2019 18:39   CT Angio Chest PE W and/or Wo Contrast  Result Date: 12/29/2019 CLINICAL DATA:  69 year old female with left upper quadrant abdominal pain. History of diverticulitis. Concern for mesenteric ischemia. EXAM: CT ANGIOGRAPHY CHEST, ABDOMEN AND PELVIS TECHNIQUE: Non-contrast CT of the chest was initially obtained. Multidetector CT imaging through the chest, abdomen and pelvis was performed using the standard protocol during bolus administration of intravenous contrast. Multiplanar reconstructed images and MIPs were obtained and reviewed to evaluate the vascular anatomy. CONTRAST:  130mL OMNIPAQUE IOHEXOL 350 MG/ML SOLN COMPARISON:  Chest radiograph dated 12/29/2019 and CT dated 11/21/2017. FINDINGS: Evaluation is limited due to streak artifact 0 caused by patient's arms. CTA CHEST FINDINGS Cardiovascular: There is no cardiomegaly or pericardial effusion.  Three-vessel coronary vascular calcification. There is moderate atherosclerotic calcification of the thoracic aorta. No aneurysmal dilatation. Evaluation of the aorta is limited due to suboptimal opacification and timing of the contrast. No pulmonary artery embolus identified. Mediastinum/Nodes: Top-normal right hilar lymph node measures 11 mm. Subcarinal lymph node measures 14 mm in short axis. The esophagus is grossly unremarkable. No mediastinal fluid collection. Lungs/Pleura: There is an 11 mm right upper lobe nodule (35/1). This nodule is new since the prior CT and concerning for malignancy. A 6 mm left upper lobe nodule (43/1) also new compared to the prior CT. There is a 4 mm left upper lobe nodule (33/1). Several additional small nodules including a 4 mm subpleural nodule in the right lower lobe (90/1) and a 3 mm nodule in the right upper lobe (38/1). These nodules are new since the prior CT and concerning for malignancy or metastatic disease. No focal consolidation, pleural effusion, or pneumothorax. The central airways are patent. Musculoskeletal: There is a 15 mm lucent lesion with sclerotic margin in the T12, present on the prior CT. This lesion is indeterminate. There is osteopenia. A small T5 sclerotic focus was present on the prior CT and may represent a bone island. No acute osseous pathology. 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 Review of the MIP images confirms the above findings. CTA ABDOMEN AND PELVIS FINDINGS VASCULAR Aorta: Moderate atherosclerotic calcification. No aneurysmal dilatation or dissection. No periaortic fluid collection. Celiac: Patent without evidence of aneurysm, dissection, vasculitis or significant stenosis. SMA: Patent without evidence of aneurysm, dissection, vasculitis or significant stenosis. Renals: Both renal arteries are patent without evidence of aneurysm, dissection, vasculitis, fibromuscular dysplasia or significant  stenosis. IMA: Patent without evidence of aneurysm, dissection, vasculitis or significant stenosis. Inflow: Atherosclerotic calcification of the iliac arteries. No aneurysmal dilatation or dissection. The iliac arteries are patent. Veins: The IVC is unremarkable. The SMV, and main portal vein are patent. No portal venous gas. Review of the MIP images confirms the above findings. NON-VASCULAR No intra-abdominal free air or free fluid. Hepatobiliary: The liver is unremarkable. No intrahepatic biliary dilatation. The gallbladder contains air likely related to recent procedure. Clinical correlation recommended. No calcified gallstone or pericholecystic fluid. There is mild dilatation of the central biliary  trees. The common bile duct measures 12 mm in diameter. No calcified stone noted in the central CBD. Pancreas: There is an ill-defined hypodense lesion in the body of the pancreas measuring approximately 2.5 x 1.5 cm (36/6. This is not characterized but concerning for malignancy. Further characterization with MRI without and with contrast recommended. There is an ill-defined hypodense extra pancreatic nodular density posterior to the pancreas adjacent to the SMA likely extension of the pancreatic lesion. No active inflammatory changes of the pancreas. Spleen: Normal in size without focal abnormality. Adrenals/Urinary Tract: The adrenal glands unremarkable. Bilateral renal cortical scarring. Small right renal cysts measure up to 2 cm in the upper pole of the right kidney. There is no hydronephrosis on either side. The visualized ureters and urinary bladder appear unremarkable. Stomach/Bowel: There is severe sigmoid and diffuse colonic diverticulosis without active inflammatory changes. There is moderate stool throughout the colon. There is no bowel obstruction or active inflammation. The appendix is normal. Lymphatic: No adenopathy. Reproductive: Ill-defined posterior body uterine fibroids measure up to approximately 4  cm. No adnexal masses. Other: Right groin surgical clips. Musculoskeletal: Osteopenia with degenerative changes of the spine. No acute osseous pathology. Review of the MIP images confirms the above findings. IMPRESSION: 1. No CT evidence of pulmonary embolism or aortic dissection. 2. Ill-defined hypodense lesion in the body of the pancreas concerning for malignancy. Further characterization with MRI without and with contrast recommended. 3. Multiple bilateral pulmonary nodules measure up to 11 mm in the right upper lobe concerning for malignancy or metastatic disease. Clinical correlation and multidisciplinary consult is advised. 4. Severe colonic diverticulosis. No bowel obstruction. Normal appendix. 5. Aortic Atherosclerosis (ICD10-I70.0). Electronically Signed   By: Anner Crete M.D.   On: 12/29/2019 22:39   MR Abdomen W or Wo Contrast  Result Date: 12/30/2019 CLINICAL DATA:  Left upper quadrant abdominal pain. Possible infiltrating pancreatic mass in the body of the pancreas on CT scan of 01/29/2020. EXAM: MRI ABDOMEN WITHOUT AND WITH CONTRAST TECHNIQUE: Multiplanar multisequence MR imaging of the abdomen was performed both before and after the administration of intravenous contrast. CONTRAST:  6.67mL GADAVIST GADOBUTROL 1 MMOL/ML IV SOLN COMPARISON:  01/29/2020 FINDINGS: Despite efforts by the technologist and patient, motion artifact is present on today's exam and could not be eliminated. This reduces exam sensitivity and specificity. Lower chest: Mild cardiomegaly. The patient has known pulmonary nodules which are not well seen on today's MRI. Hepatobiliary: 5 mm T2 hyperintense, T1 hyperintense lesion in the lateral segment left hepatic lobe on image 36 of series 17, not appreciably enhancing, probably a small complex cyst. Cholecystectomy. Common bile duct measures up to 1.2 cm in diameter, likely a physiologic response to cholecystectomy. Pancreas: Hypoenhancing 2.2 by 1.5 by 1.8 cm mass within and  along the posteroinferior pancreatic body abutting the anterior margin of the superior mesenteric artery but not wrapping around the SMA. This lesion is mildly T2 hyperintense compared to the surrounding pancreatic parenchyma. There is anterior displacement of the dorsal pancreatic duct in the vicinity of the mass, and borderline prominence of the duct along with a 0.8 by 0.5 cm cystic lesion along the left lateral margin of the mass which may represent a dilated side duct or conceivably even a small adjacent IPMN. The right margin of mass is tangential to the edge of the portal vein and there is poor opacification splenic vein which is in the immediate vicinity of the mass and accordingly suspicious for splenic vein thrombosis. Spleen:  No splenomegaly or  focal splenic lesion. Adrenals/Urinary Tract: Lobularity of the left adrenal gland without a discrete mass. Bilateral renal cysts. Mild scarring of the left kidney lower pole. Complex 1.1 by 0.7 cm Bosniak category 2 cyst of the right kidney upper pole on image 53 of series 17. Stomach/Bowel: Colonic diverticulosis. Vascular/Lymphatic: As noted above, there is poor opacification of the splenic vein adjacent to the pancreatic mass, suspicious for localized occlusion. The mass is tangential to the anterior margin of the SMA and the left lateral margin of the portal venous confluence, without wrapping around either of the structures. Aortoiliac atherosclerotic vascular disease. Other:  No supplemental non-categorized findings. Musculoskeletal: Levoconvex thoracolumbar scoliosis with degenerative disc disease and intervertebral spurring. IMPRESSION: 1. Hypoenhancing 2.2 by 1.5 by 1.8 cm mass within and along the posteroinferior pancreatic body highly suspicious for pancreatic adenocarcinoma. The right margin of the mass is tangential to the anterior margin of the SMA and the left lateral margin of the portal venous confluence, with poor opacification of the splenic  vein in the immediate vicinity of the mass, suspicious for localized occlusion. The mass is minimally tangential to the anterior margin of the SMA, and the left margin of the portal venous confluence. 2. No findings of metastatic disease in the abdomen. The patient has known pulmonary nodules which are not well seen on today's MRI. 3. Colonic diverticulosis. 4. Levoconvex thoracolumbar scoliosis with degenerative disc disease and intervertebral spurring. 5.  Aortic Atherosclerosis (ICD10-I70.0). Electronically Signed   By: Van Clines M.D.   On: 12/30/2019 10:26   ECHOCARDIOGRAM COMPLETE  Result Date: 12/30/2019    ECHOCARDIOGRAM REPORT   Patient Name:   Rebecca Weaver Date of Exam: 12/30/2019 Medical Rec #:  [id number]       Height:       67.0 in Accession #:    [id number]      Weight:       142.0 lb Date of Birth:  [birth year]        BSA:          1.748 m Patient Age:    34 years        BP:           113/86 mmHg Patient Gender: F               HR:           85 bpm. Exam Location:  Inpatient Procedure: 2D Echo Indications:    atrial flutter 427.32  History:        Patient has no prior history of Echocardiogram examinations.                 COPD, Arrythmias:Atrial Fibrillation and Atrial Flutter; Risk                 Factors:Hypertension and Dyslipidemia.  Sonographer:    Johny Chess Referring Phys: [id number] Vernelle Emerald  Sonographer Comments: Image acquisition challenging due to respiratory motion. IMPRESSIONS  1. Left ventricular ejection fraction, by estimation, is 50 to 55%. The left ventricle has low normal function. The left ventricle has no regional wall motion abnormalities. Left ventricular diastolic parameters are indeterminate.  2. Right ventricular systolic function is low normal. The right ventricular size is mildly enlarged. Mildly increased right ventricular wall thickness.  3. Left atrial size was severely dilated.  4. The mitral valve is grossly normal. Trivial mitral valve  regurgitation.  5. The aortic valve is tricuspid. Aortic valve regurgitation is not visualized.  6. The  inferior vena cava is dilated in size with <50% respiratory variability, suggesting right atrial pressure of 15 mmHg. Comparison(s): No prior Echocardiogram. FINDINGS  Left Ventricle: Left ventricular ejection fraction, by estimation, is 50 to 55%. The left ventricle has low normal function. The left ventricle has no regional wall motion abnormalities. The left ventricular internal cavity size was normal in size. There is borderline left ventricular hypertrophy. Left ventricular diastolic parameters are indeterminate. Right Ventricle: The right ventricular size is mildly enlarged. Mildly increased right ventricular wall thickness. Right ventricular systolic function is low normal. Left Atrium: Left atrial size was severely dilated. Right Atrium: Right atrial size was normal in size. Pericardium: There is no evidence of pericardial effusion. Mitral Valve: The mitral valve is grossly normal. Trivial mitral valve regurgitation. Tricuspid Valve: The tricuspid valve is grossly normal. Tricuspid valve regurgitation is trivial. Aortic Valve: The aortic valve is tricuspid. Aortic valve regurgitation is not visualized. Pulmonic Valve: The pulmonic valve was not well visualized. Pulmonic valve regurgitation is not visualized. Aorta: The aortic root and ascending aorta are structurally normal, with no evidence of dilitation. Venous: The pulmonary veins were not well visualized. The inferior vena cava is dilated in size with less than 50% respiratory variability, suggesting right atrial pressure of 15 mmHg. IAS/Shunts: No atrial level shunt detected by color flow Doppler.  LEFT VENTRICLE PLAX 2D LVIDd:         5.00 cm LVIDs:         3.50 cm LV PW:         1.10 cm LV IVS:        1.00 cm LVOT diam:     2.10 cm LV SV:         48 LV SV Index:   28 LVOT Area:     3.46 cm  RIGHT VENTRICLE             IVC RV S prime:     10.00 cm/s   IVC diam: 2.30 cm TAPSE (M-mode): 1.8 cm LEFT ATRIUM             Index       RIGHT ATRIUM           Index LA diam:        4.30 cm 2.46 cm/m  RA Area:     13.40 cm LA Vol (A2C):   58.5 ml 33.46 ml/m RA Volume:   28.50 ml  16.30 ml/m LA Vol (A4C):   79.9 ml 45.70 ml/m LA Biplane Vol: 74.1 ml 42.38 ml/m  AORTIC VALVE LVOT Vmax:   81.70 cm/s LVOT Vmean:  51.100 cm/s LVOT VTI:    0.140 m  AORTA Ao Root diam: 3.00 cm Ao Asc diam:  2.90 cm  SHUNTS Systemic VTI:  0.14 m Systemic Diam: 2.10 cm Rudean Haskell MD Electronically signed by Rudean Haskell MD Signature Date/Time: 12/30/2019/3:50:11 PM    Final    VAS [place] LOWER EXTREMITY VENOUS (DVT) (ONLY [facility] & [facility])  Result Date: 12/30/2019  Lower Venous DVT Study Indications: Pain.  Comparison Study: No prior studies. Performing Technologist: Oliver Hum RVT  Examination Guidelines: A complete evaluation includes B-mode imaging, spectral Doppler, color Doppler, and power Doppler as needed of all accessible portions of each vessel. Bilateral testing is considered an integral part of a complete examination. Limited examinations for reoccurring indications may be performed as noted. The reflux portion of the exam is performed with the patient in reverse Trendelenburg.  +-----+---------------+---------+-----------+----------+--------------+ RIGHTCompressibilityPhasicitySpontaneityPropertiesThrombus Aging +-----+---------------+---------+-----------+----------+--------------+ CFV  Full           Yes      Yes                                 +-----+---------------+---------+-----------+----------+--------------+   +---------+---------------+---------+-----------+----------+--------------+ LEFT     CompressibilityPhasicitySpontaneityPropertiesThrombus Aging +---------+---------------+---------+-----------+----------+--------------+ CFV      Full           Yes      Yes                                  +---------+---------------+---------+-----------+----------+--------------+ SFJ      Full                                                        +---------+---------------+---------+-----------+----------+--------------+ FV Prox  Full                                                        +---------+---------------+---------+-----------+----------+--------------+ FV Mid   Full                                                        +---------+---------------+---------+-----------+----------+--------------+ FV DistalFull                                                        +---------+---------------+---------+-----------+----------+--------------+ PFV      Full                                                        +---------+---------------+---------+-----------+----------+--------------+ POP      Full           Yes      Yes                                 +---------+---------------+---------+-----------+----------+--------------+ PTV      Full                                                        +---------+---------------+---------+-----------+----------+--------------+ PERO     Full                                                        +---------+---------------+---------+-----------+----------+--------------+  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    Summary: RIGHT: - No evidence of common femoral vein obstruction.  LEFT: - There is no evidence of deep vein thrombosis in the lower extremity.  - No cystic structure found in the popliteal fossa. 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  *See table(s) above for measurements and observations. Electronically signed by Curt Jews MD on 12/30/2019 at 4:27:28 PM.    Final    CT Angio Abd/Pel W and/or Wo Contrast  Result Date: 12/29/2019 CLINICAL DATA:  69 year old female with left upper quadrant abdominal pain.  History of diverticulitis. Concern for mesenteric ischemia. EXAM: CT ANGIOGRAPHY CHEST, ABDOMEN AND PELVIS TECHNIQUE: Non-contrast CT of the chest was initially obtained. Multidetector CT imaging through the chest, abdomen and pelvis was performed using the standard protocol during bolus administration of intravenous contrast. Multiplanar reconstructed images and MIPs were obtained and reviewed to evaluate the vascular anatomy. CONTRAST:  154mL OMNIPAQUE IOHEXOL 350 MG/ML SOLN COMPARISON:  Chest radiograph dated 12/29/2019 and CT dated 11/21/2017. FINDINGS: Evaluation is limited due to streak artifact 0 caused by patient's arms. CTA CHEST FINDINGS Cardiovascular: There is no cardiomegaly or pericardial effusion. Three-vessel coronary vascular calcification. There is moderate atherosclerotic calcification of the thoracic aorta. No aneurysmal dilatation. Evaluation of the aorta is limited due to suboptimal opacification and timing of the contrast. No pulmonary artery embolus identified. Mediastinum/Nodes: Top-normal right hilar lymph node measures 11 mm. Subcarinal lymph node measures 14 mm in short axis. The esophagus is grossly unremarkable. No mediastinal fluid collection. Lungs/Pleura: There is an 11 mm right upper lobe nodule (35/1). This nodule is new since the prior CT and concerning for malignancy. A 6 mm left upper lobe nodule (43/1) also new compared to the prior CT. There is a 4 mm left upper lobe nodule (33/1). Several additional small nodules including a 4 mm subpleural nodule in the right lower lobe (90/1) and a 3 mm nodule in the right upper lobe (38/1). These nodules are new since the prior CT and concerning for malignancy or metastatic disease. No focal consolidation, pleural effusion, or pneumothorax. The central airways are patent. Musculoskeletal: There is a 15 mm lucent lesion with sclerotic margin in the T12, present on the prior CT. This lesion is indeterminate. There is osteopenia. A small T5  sclerotic focus was present on the prior CT and may represent a bone island. No acute osseous pathology. 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 Review of the MIP images confirms the above findings. CTA ABDOMEN AND PELVIS FINDINGS VASCULAR Aorta: Moderate atherosclerotic calcification. No aneurysmal dilatation or dissection. No periaortic fluid collection. Celiac: Patent without evidence of aneurysm, dissection, vasculitis or significant stenosis. SMA: Patent without evidence of aneurysm, dissection, vasculitis or significant stenosis. Renals: Both renal arteries are patent without evidence of aneurysm, dissection, vasculitis, fibromuscular dysplasia or significant stenosis. IMA: Patent without evidence of aneurysm, dissection, vasculitis or significant stenosis. Inflow: Atherosclerotic calcification of the iliac arteries. No aneurysmal dilatation or dissection. The iliac arteries are patent. Veins: The IVC is unremarkable. The SMV, and main portal vein are patent. No portal venous gas. Review of the MIP images confirms the above findings. NON-VASCULAR No intra-abdominal free air or free fluid. Hepatobiliary: The liver is unremarkable. No intrahepatic biliary dilatation. The gallbladder contains air likely related to recent procedure. Clinical correlation recommended. No calcified gallstone or pericholecystic fluid. There  is mild dilatation of the central biliary trees. The common bile duct measures 12 mm in diameter. No calcified stone noted in the central CBD. Pancreas: There is an ill-defined hypodense lesion in the body of the pancreas measuring approximately 2.5 x 1.5 cm (36/6. This is not characterized but concerning for malignancy. Further characterization with MRI without and with contrast recommended. There is an ill-defined hypodense extra pancreatic nodular density posterior to the pancreas adjacent to the SMA likely extension of the pancreatic lesion. No  active inflammatory changes of the pancreas. Spleen: Normal in size without focal abnormality. Adrenals/Urinary Tract: The adrenal glands unremarkable. Bilateral renal cortical scarring. Small right renal cysts measure up to 2 cm in the upper pole of the right kidney. There is no hydronephrosis on either side. The visualized ureters and urinary bladder appear unremarkable. Stomach/Bowel: There is severe sigmoid and diffuse colonic diverticulosis without active inflammatory changes. There is moderate stool throughout the colon. There is no bowel obstruction or active inflammation. The appendix is normal. Lymphatic: No adenopathy. Reproductive: Ill-defined posterior body uterine fibroids measure up to approximately 4 cm. No adnexal masses. Other: Right groin surgical clips. Musculoskeletal: Osteopenia with degenerative changes of the spine. No acute osseous pathology. Review of the MIP images confirms the above findings. IMPRESSION: 1. No CT evidence of pulmonary embolism or aortic dissection. 2. Ill-defined hypodense lesion in the body of the pancreas concerning for malignancy. Further characterization with MRI without and with contrast recommended. 3. Multiple bilateral pulmonary nodules measure up to 11 mm in the right upper lobe concerning for malignancy or metastatic disease. Clinical correlation and multidisciplinary consult is advised. 4. Severe colonic diverticulosis. No bowel obstruction. Normal appendix. 5. Aortic Atherosclerosis (ICD10-I70.0). Electronically Signed   By: Anner Crete M.D.   On: 12/29/2019 22:39    Labs:  CBC: Recent Labs    12/29/19 1821 12/30/19 0500 01/09/20 1003  WBC 8.0 7.0 8.5  HGB 15.0 14.5 16.1*  HCT 44.9 44.7 48.6*  PLT 199 164 293    COAGS: Recent Labs    01/09/20 1003  INR 1.1    BMP: Recent Labs    12/29/19 1821 12/30/19 0511 12/31/19 0342  NA 137 138 139  K 3.7 3.8 3.6  CL 101 101 103  CO2 24 28 26   GLUCOSE 81 94 98  BUN 15 11 8   CALCIUM  9.0 8.9 8.8*  CREATININE 0.79 0.78 0.67  GFRNONAA >60 >60 >60    LIVER FUNCTION TESTS: Recent Labs    12/29/19 1821 12/30/19 0511 12/31/19 0342  BILITOT 1.1 1.0  --   AST 16 16  --   ALT 16 17  --   ALKPHOS 52 48  --   PROT 6.0* 6.1*  --   ALBUMIN 3.7 3.7 3.3*     Assessment and Plan: Pancreatic mass For attempt at CT guided biopsy/FNA Labs ok Has held Eliquis >48 hrs as directed. Risks and benefits of pancreatic mass biopsy was discussed with the patient and/or patient's family including, but not limited to bleeding, infection, damage to adjacent structures or low yield requiring additional tests.  All of the questions were answered and there is agreement to proceed.  Consent signed and in chart.    Thank you for this interesting consult.  I greatly enjoyed meeting JAMAYIA CROKER and look forward to participating in their care.  A copy of this report was sent to the requesting provider on this date.  Electronically Signed: Ascencion Dike, PA-C 01/09/2020, 10:44 AM  I spent a total of 20 minutes in face to face in clinical consultation, greater than 50% of which was counseling/coordinating care for pancreas biopsy

## 2020-01-09 NOTE — Progress Notes (Signed)
Ascencion Dike, PA called and asked when pt can restart Eliquis states can restart tomorrow pt informed

## 2020-01-09 NOTE — Progress Notes (Signed)
Discharge instructions reviewed with pt and her husband (via telephone) both voice understanding.  

## 2020-01-09 NOTE — Procedures (Signed)
Interventional Radiology Procedure Note  Procedure: US guided biopsy of pancreas mass, with 4 x FNA Complications: None EBL: None Recommendations: - Bedrest 1 hours.   - Routine wound care - Follow up pathology - Advance diet   Signed,  Corrie Mckusick, DO

## 2020-01-13 NOTE — Progress Notes (Signed)
Patient calls back she is unable to come in on Friday 11/5 because her brother in law passed away.  She is rescheduled for Monday 11/8 at 2 pm to arrive by 1:45 for registration.  She knows she can bring one person with her to the consult and that [facility] parking is available.

## 2020-01-13 NOTE — Progress Notes (Signed)
Left voice message for patient to call me back regarding referral I received from Dr. [name].  She was given my direct call back number.

## 2020-01-13 NOTE — Progress Notes (Signed)
Cardiology Office Note:   Date:  01/14/2020  NAME:  Rebecca Weaver    MRN: [id number] DOB:  [birth year]   PCP:  Nickola Major, MD  Cardiologist:  Evalina Field, MD   Referring MD: Nickola Major, MD   Chief Complaint  Patient presents with  . Atrial Flutter   History of Present Illness:   Rebecca Weaver is a 69 y.o. female with a hx of atrial flutter, pancreatic mass, COPD, HTN who is being seen today for the evaluation of atrial flutter at the request of Nickola Major, MD. She was seen in the ER 12/29/2019 with abdominal pain and Aflutter. She was found to have pancreatic mass. AC held for biopsy. TSH 2.68.   Recent diagnosis of pancreatic cancer as above.  She was actually found to have atrial flutter at her primary care physician office.  She was in sent to the emergency room and had abdominal pain there.  She was then found to have the cancer.  She is now on anticoagulation.  She underwent a biopsy.  She is awaiting see oncology on Monday.  She remains in atrial flutter with a heart rate of 135.  She has a quite regular rhythm.  Rates have been difficult to control for I can tell.  Echocardiogram shows normal function.  She does have lower extremity edema in the left leg.  She does have venous insufficiency that leg.  DVT study was negative.  CT PE study was negative as well.  She is not converted back to normal rhythm.  I do have concerns about her proceeding with further procedures with her uncontrolled heart rate.  Atrial flutter is very difficult to control and I think a TEE cardioversion will be the only way to do this.  However, this will delay any procedure for 1 month.  Despite having rapid heart rate she has no symptoms of chest pain, shortness of breath or palpitations.  She is unaware of her heart beating fast.  She is a current everyday smoker.  She has COPD.  Hemoglobin value 14.5.  Most recent thyroid panel was normal.  She never had a heart attack or stroke per her  knowledge.  She never had any major medical issues until now.  Problem List 1. COPD 2. HTN 3. Pancreatic adenocarcinoma  4. Atrial flutter -Dx 12/2019 -CHADSVASC=3 (age, sex, HTN) -Ef 50-55%  Past Medical History: Past Medical History:  Diagnosis Date  . Anemia   . Arthritis   . COPD (chronic obstructive pulmonary disease) ([place])   . Coronary artery calcification   . GERD (gastroesophageal reflux disease)   . Hyperlipidemia   . Hypertension   . Lung nodules   . Osteopenia   . Pseudotumor cerebri     Past Surgical History: Past Surgical History:  Procedure Laterality Date  . CHOLECYSTECTOMY    . ESOPHAGOGASTRODUODENOSCOPY (EGD) WITH PROPOFOL N/A 01/02/2020   Procedure: ESOPHAGOGASTRODUODENOSCOPY (EGD) WITH PROPOFOL;  Surgeon: [name], MD;  Location: [facility]COPY;  Service: Endoscopy;  Laterality: N/A;  . FINE NEEDLE ASPIRATION N/A 01/02/2020   Procedure: FINE NEEDLE ASPIRATION (FNA) LINEAR;  Surgeon: [name], MD;  Location: [facility]COPY;  Service: Endoscopy;  Laterality: N/A;  . TUBAL LIGATION    . UPPER ESOPHAGEAL ENDOSCOPIC ULTRASOUND (EUS) N/A 01/02/2020   Procedure: UPPER ESOPHAGEAL ENDOSCOPIC ULTRASOUND (EUS);  Surgeon: [name], MD;  Location: [facility];  Service: Endoscopy;  Laterality: N/A;  . VARICOSE VEIN SURGERY     multiple  Current Medications: Current Meds  Medication Sig  . acetaminophen (TYLENOL) 500 MG tablet Take 1,000 mg by mouth every 6 (six) hours as needed for moderate pain or headache.  Marland Kitchen apixaban (ELIQUIS) 5 MG TABS tablet Take 1 tablet (5 mg total) by mouth 2 (two) times daily. Do not start this until you have the pancreatic biopsy done by GI.  . benazepril (LOTENSIN) 20 MG tablet Take 20 mg by mouth daily.  . Cyanocobalamin (B-12 COMPLIANCE INJECTION IJ) Inject 1 Dose as directed every 30 (thirty) days.  Marland Kitchen diltiazem (CARDIZEM CD) 120 MG 24 hr capsule Take 1 capsule (120 mg total) by mouth daily.  Marland Kitchen ezetimibe (ZETIA) 10 MG  tablet Take 10 mg by mouth daily.  . famotidine (PEPCID) 20 MG tablet Take 20 mg by mouth daily.  . fluticasone (FLONASE) 50 MCG/ACT nasal spray Place 1 spray into both nostrils 2 (two) times daily.   . Fluticasone-Salmeterol (ADVAIR) 250-50 MCG/DOSE AEPB Inhale 1 puff into the lungs 2 (two) times daily.  Marland Kitchen loratadine (CLARITIN) 10 MG tablet Take 10 mg by mouth daily.  . Multiple Vitamin (MULTIVITAMIN WITH MINERALS) TABS tablet Take 1 tablet by mouth daily.  Marland Kitchen Naphazoline-Pheniramine (VISINE-A OP) Place 1 drop into both eyes daily.  . [DISCONTINUED] amoxicillin (AMOXIL) 500 MG tablet Take 500 mg by mouth every 8 (eight) hours.  . [DISCONTINUED] apixaban (ELIQUIS) 5 MG TABS tablet Take 1 tablet (5 mg total) by mouth 2 (two) times daily. Do not start this until you have the pancreatic biopsy done by GI.  . [DISCONTINUED] diltiazem (CARDIZEM CD) 120 MG 24 hr capsule Take 1 capsule (120 mg total) by mouth daily.     Allergies:    Atorvastatin, Ciprofloxacin, and Sulfa antibiotics   Social History: Social History   Socioeconomic History  . Marital status: Married    Spouse name: Not on file  . Number of children: 2  . Years of education: Not on file  . Highest education level: Not on file  Occupational History  . Occupation: retired - Development worker, community  Tobacco Use  . Smoking status: Current Every Day Smoker    Packs/day: 0.75    Years: 50.00    Pack years: 37.50    Types: Cigarettes  . Smokeless tobacco: Never Used  Substance and Sexual Activity  . Alcohol use: Yes    Alcohol/week: 0.0 standard drinks    Comment: social occassions only  . Drug use: Not Currently  . Sexual activity: Not on file  Other Topics Concern  . Not on file  Social History Narrative  . Not on file   Social Determinants of Health   Financial Resource Strain:   . Difficulty of Paying Living Expenses: Not on file  Food Insecurity:   . Worried About Charity fundraiser in the Last Year: Not on file  . Ran  Out of Food in the Last Year: Not on file  Transportation Needs:   . Lack of Transportation (Medical): Not on file  . Lack of Transportation (Non-Medical): Not on file  Physical Activity:   . Days of Exercise per Week: Not on file  . Minutes of Exercise per Session: Not on file  Stress:   . Feeling of Stress : Not on file  Social Connections:   . Frequency of Communication with Friends and Family: Not on file  . Frequency of Social Gatherings with Friends and Family: Not on file  . Attends Religious Services: Not on file  . Active Member  of Clubs or Organizations: Not on file  . Attends Archivist Meetings: Not on file  . Marital Status: Not on file     Family History: The patient's family history includes Heart disease in her father; Varicose Veins in her father and sister.  ROS:   All other ROS reviewed and negative. Pertinent positives noted in the HPI.     EKGs/Labs/Other Studies Reviewed:   The following studies were personally reviewed by me today:  EKG:  EKG is ordered today.  The ekg ordered today demonstrates atrial flutter with heart rate 135, appears to be typical, and was personally reviewed by me.   TTE 12/30/2019 1. Left ventricular ejection fraction, by estimation, is 50 to 55%. The  left ventricle has low normal function. The left ventricle has no regional  wall motion abnormalities. Left ventricular diastolic parameters are  indeterminate.  2. Right ventricular systolic function is low normal. The right  ventricular size is mildly enlarged. Mildly increased right ventricular  wall thickness.  3. Left atrial size was severely dilated.  4. The mitral valve is grossly normal. Trivial mitral valve  regurgitation.  5. The aortic valve is tricuspid. Aortic valve regurgitation is not  visualized.  6. The inferior vena cava is dilated in size with <50% respiratory  variability, suggesting right atrial pressure of 15 mmHg.   Recent  Labs: 12/29/2019: B Natriuretic Peptide 65.4 12/30/2019: ALT 17; TSH 2.679 12/31/2019: BUN 8; Creatinine, Ser 0.67; Magnesium 1.7; Potassium 3.6; Sodium 139 01/09/2020: Hemoglobin 16.1; Platelets 293   Recent Lipid Panel No results found for: CHOL, TRIG, HDL, CHOLHDL, VLDL, LDLCALC, LDLDIRECT  Physical Exam:   VS:  BP 122/74   Pulse (!) 135   Ht 5\' 7"  (1.702 m)   Wt 144 lb 12.8 oz (65.7 kg)   SpO2 100%   BMI 22.68 kg/m    Wt Readings from Last 3 Encounters:  01/14/20 144 lb 12.8 oz (65.7 kg)  01/09/20 143 lb (64.9 kg)  12/31/19 147 lb 4.8 oz (66.8 kg)    General: Well nourished, well developed, in no acute distress Heart: Atraumatic, normal size  Eyes: PEERLA, EOMI  Neck: Supple, no JVD Endocrine: No thryomegaly Cardiac: Normal S1, S2; regular rhythm, tachycardia noted Lungs: Clear to auscultation bilaterally, no wheezing, rhonchi or rales  Abd: Soft, nontender, no hepatomegaly  Ext: 2+ pitting edema in the left lower extremity Musculoskeletal: No deformities, BUE and BLE strength normal and equal Skin: Warm and dry, no rashes   Neuro: Alert and oriented to person, place, time, and situation, CNII-XII grossly intact, no focal deficits  Psych: Normal mood and affect   ASSESSMENT:   Rebecca Weaver is a 69 y.o. female who presents for the following: 1. Atrial flutter with rapid ventricular response ([place])   2. Essential hypertension   3. Leg edema     PLAN:   1. Atrial flutter with rapid ventricular response ([place]) -New onset atrial flutter with RVR.  Triggered by likely COPD and pancreatic cancer.  Her thyroid studies were normal.  No evidence of myocardial infarction that I can tell.  No pulmonary embolism on recent CT study. -She remains persistently in atrial flutter with heart rate of 135.  EKG from the hospital more clearly shows flutter waves.  Echo shows EF 50-55%.  Her IVC was collapsible on my review.  She does not appear to be in congestive heart failure. -Her  lower extremity edema is likely related varicose veins.  DVT study was negative. -  This is a rather problematic issue.  She does need a TEE/cardioversion but this will delay any procedures for 1 month.  She was recently diagnosed with pancreatic adenocarcinoma and is waiting for oncology appointment on Monday.  I do feel that they will not proceed with any further treatment until this is addressed.  We will tentatively plan for TEE/cardioversion next week.  She is back on Eliquis after her biopsy.  If we need to delay this we will.  However I do not think they will do any procedures or deliver chemo with her heart rate that is uncontrolled.  Unfortunately, atrial flutter is very difficult to control. -CHADSVAS= 3 (age, sex, HTN) -We will continue her diltiazem 120 mg daily for now.  I will see her back in 2 weeks.  2. Essential hypertension -Blood pressure well controlled.  Continue diltiazem.  3. Leg edema -DVT study negative.  Suspect this is all related to her rapid heartbeat and varicose veins.  We will give her Lasix 20 mg daily.  Disposition: Return in about 2 weeks (around 01/28/2020).  Medication Adjustments/Labs and Tests Ordered: Current medicines are reviewed at length with the patient today.  Concerns regarding medicines are outlined above.  Orders Placed This Encounter  Procedures  . CBC w/Diff/Platelet  . Basic metabolic panel  . EKG 12-Lead   Meds ordered this encounter  Medications  . furosemide (LASIX) 20 MG tablet    Sig: Take 1 tablet (20 mg total) by mouth daily.    Dispense:  90 tablet    Refill:  3  . apixaban (ELIQUIS) 5 MG TABS tablet    Sig: Take 1 tablet (5 mg total) by mouth 2 (two) times daily. Do not start this until you have the pancreatic biopsy done by GI.    Dispense:  180 tablet    Refill:  1  . diltiazem (CARDIZEM CD) 120 MG 24 hr capsule    Sig: Take 1 capsule (120 mg total) by mouth daily.    Dispense:  90 capsule    Refill:  3    Patient  Instructions  Medication Instructions:  Start Lasix 20 mg daily  Continue all other medications *If you need a refill on your cardiac medications before your next appointment, please call your pharmacy*   Lab Work: Bmet,cbc today If you have labs (blood work) drawn today and your tests are completely normal, you will receive your results only by: Marland Kitchen MyChart Message (if you have MyChart) OR . A paper copy in the mail If you have any lab test that is abnormal or we need to change your treatment, we will call you to review the results.   Testing/Procedures: TEE Cardioversion     Follow instructions below   Follow-Up: At [facility], you and your health needs are our priority.  As part of our continuing mission to provide you with exceptional heart care, we have created designated Provider Care Teams.  These Care Teams include your primary Cardiologist (physician) and Advanced Practice Providers (APPs -  Physician Assistants and Nurse Practitioners) who all work together to provide you with the care you need, when you need it.  We recommend signing up for the patient portal called "MyChart".  Sign up information is provided on this After Visit Summary.  MyChart is used to connect with patients for Virtual Visits (Telemedicine).  Patients are able to view lab/test results, encounter notes, upcoming appointments, etc.  Non-urgent messages can be sent to your provider as well.  To learn more about what you can do with MyChart, go to [email or web address].    Your next appointment:  Tuesday 02/10/20 at 11:40 am   The format for your next appointment: Office    Provider:  Dr.O'Neal     You are scheduled for a TEE Cardioversion on Wednesday 01/21/20 with Dr. Gasper Sells.  Please arrive at the [facility] (Main Entrance A) at [facility]: [address] [place], [place] [address] at 10:00 am.   DIET: Nothing to eat or drink after midnight except a sip of water with  medications (see medication instructions below)  Medication Instructions:   Continue your anticoagulant: Eliquis You will need to continue your anticoagulant after your procedure until you  are told by your  Provider that it is safe to stop   Labs: Cbc,bmet today    You must have a responsible person to drive you home and stay in the waiting area during your procedure. Failure to do so could result in cancellation.  Bring your insurance cards.  *Special Note: Every effort is made to have your procedure done on time. Occasionally there are emergencies that occur at the hospital that may cause delays. Please be patient if a delay does occur.         Signed, Addison Naegeli. Audie Box, [place]  [address] [place], [place] [address] [phone]  01/14/2020 12:06 PM

## 2020-01-14 ENCOUNTER — Other Ambulatory Visit: Payer: Self-pay

## 2020-01-14 ENCOUNTER — Ambulatory Visit (INDEPENDENT_AMBULATORY_CARE_PROVIDER_SITE_OTHER): Payer: Medicare Other | Admitting: Cardiovascular Disease

## 2020-01-14 ENCOUNTER — Encounter: Payer: Self-pay | Admitting: Cardiovascular Disease

## 2020-01-14 VITALS — BP 122/74 | HR 135 | Ht 67.0 in | Wt 144.8 lb

## 2020-01-14 DIAGNOSIS — I4892 Unspecified atrial flutter: Secondary | ICD-10-CM

## 2020-01-14 DIAGNOSIS — I10 Essential (primary) hypertension: Secondary | ICD-10-CM | POA: Diagnosis not present

## 2020-01-14 DIAGNOSIS — R600 Localized edema: Secondary | ICD-10-CM

## 2020-01-14 MED ORDER — FUROSEMIDE 20 MG PO TABS: 20.0000 mg | ORAL_TABLET | Freq: Every day | ORAL | 3 refills | Status: DC

## 2020-01-14 MED ORDER — APIXABAN 5 MG PO TABS: 5.0000 mg | ORAL_TABLET | Freq: Two times a day (BID) | ORAL | 1 refills | Status: DC

## 2020-01-14 MED ORDER — DILTIAZEM HCL ER COATED BEADS 120 MG PO CP24: 120.0000 mg | ORAL_CAPSULE | Freq: Every day | ORAL | 3 refills | Status: DC

## 2020-01-14 NOTE — Patient Instructions (Signed)
Medication Instructions:  Start Lasix 20 mg daily  Continue all other medications *If you need a refill on your cardiac medications before your next appointment, please call your pharmacy*   Lab Work: Bmet,cbc today If you have labs (blood work) drawn today and your tests are completely normal, you will receive your results only by: Marland Kitchen MyChart Message (if you have MyChart) OR . A paper copy in the mail If you have any lab test that is abnormal or we need to change your treatment, we will call you to review the results.   Testing/Procedures: TEE Cardioversion     Follow instructions below   Follow-Up: At [facility], you and your health needs are our priority.  As part of our continuing mission to provide you with exceptional heart care, we have created designated Provider Care Teams.  These Care Teams include your primary Cardiologist (physician) and Advanced Practice Providers (APPs -  Physician Assistants and Nurse Practitioners) who all work together to provide you with the care you need, when you need it.  We recommend signing up for the patient portal called "MyChart".  Sign up information is provided on this After Visit Summary.  MyChart is used to connect with patients for Virtual Visits (Telemedicine).  Patients are able to view lab/test results, encounter notes, upcoming appointments, etc.  Non-urgent messages can be sent to your provider as well.   To learn more about what you can do with MyChart, go to [email or web address].    Your next appointment:  Tuesday 02/10/20 at 11:40 am   The format for your next appointment: Office    Provider:  Dr.O'Neal     You are scheduled for a TEE Cardioversion on Wednesday 01/21/20 with Dr. Gasper Sells.  Please arrive at the [facility] (Main Entrance A) at [facility]: [address] [place], [place] [address] at 10:00 am.   DIET: Nothing to eat or drink after midnight except a sip of water with medications (see  medication instructions below)  Medication Instructions:   Continue your anticoagulant: Eliquis You will need to continue your anticoagulant after your procedure until you  are told by your  Provider that it is safe to stop   Labs: Cbc,bmet today    You must have a responsible person to drive you home and stay in the waiting area during your procedure. Failure to do so could result in cancellation.  Bring your insurance cards.  *Special Note: Every effort is made to have your procedure done on time. Occasionally there are emergencies that occur at the hospital that may cause delays. Please be patient if a delay does occur.

## 2020-01-15 ENCOUNTER — Other Ambulatory Visit: Payer: Self-pay

## 2020-01-15 ENCOUNTER — Other Ambulatory Visit: Payer: Self-pay | Admitting: *Deleted

## 2020-01-15 DIAGNOSIS — C259 Malignant neoplasm of pancreas, unspecified: Secondary | ICD-10-CM

## 2020-01-15 LAB — BASIC METABOLIC PANEL
BUN/Creatinine Ratio: 12 (ref 12–28)
BUN: 9 mg/dL (ref 8–27)
CO2: 25 mmol/L (ref 20–29)
Calcium: 9.6 mg/dL (ref 8.7–10.3)
Chloride: 92 mmol/L — ABNORMAL LOW (ref 96–106)
Creatinine, Ser: 0.74 mg/dL (ref 0.57–1.00)
GFR calc Af Amer: 97 mL/min/{1.73_m2} (ref >59)
GFR calc non Af Amer: 84 mL/min/{1.73_m2} (ref >59)
Glucose: 100 mg/dL — ABNORMAL HIGH (ref 65–99)
Potassium: 4.9 mmol/L (ref 3.5–5.2)
Sodium: 134 mmol/L (ref 134–144)

## 2020-01-15 LAB — CBC WITH DIFFERENTIAL/PLATELET
Basophils Absolute: 0.1 10*3/uL (ref 0.0–0.2)
Basos: 1 %
EOS (ABSOLUTE): 0.2 10*3/uL (ref 0.0–0.4)
Eos: 2 %
Hematocrit: 42.6 % (ref 34.0–46.6)
Hemoglobin: 14.8 g/dL (ref 11.1–15.9)
Immature Grans (Abs): 0 10*3/uL (ref 0.0–0.1)
Immature Granulocytes: 0 %
Lymphocytes Absolute: 1.7 10*3/uL (ref 0.7–3.1)
Lymphs: 17 %
MCH: 34.2 pg — ABNORMAL HIGH (ref 26.6–33.0)
MCHC: 34.7 g/dL (ref 31.5–35.7)
MCV: 98 fL — ABNORMAL HIGH (ref 79–97)
Monocytes Absolute: 1.1 10*3/uL — ABNORMAL HIGH (ref 0.1–0.9)
Monocytes: 11 %
Neutrophils Absolute: 6.8 10*3/uL (ref 1.4–7.0)
Neutrophils: 69 %
Platelets: 275 10*3/uL (ref 150–450)
RBC: 4.33 x10E6/uL (ref 3.77–5.28)
RDW: 11.5 % — ABNORMAL LOW (ref 11.7–15.4)
WBC: 9.8 10*3/uL (ref 3.4–10.8)

## 2020-01-15 MED ORDER — DILTIAZEM HCL ER COATED BEADS 120 MG PO CP24: 240.0000 mg | ORAL_CAPSULE | Freq: Every day | ORAL | 3 refills | Status: DC

## 2020-01-15 NOTE — Progress Notes (Signed)
Spoke with patient regarding port placement, she is to take her last dose of Eliquis on Saturday none after that until after her port placement on Tuesday 11/9 to arrive at [facility] radiology at 12:00 NPO past 7 am must have a driver.  She verbalized an understanding.

## 2020-01-16 ENCOUNTER — Ambulatory Visit: Payer: Medicare Other | Admitting: Oncology

## 2020-01-17 ENCOUNTER — Other Ambulatory Visit (HOSPITAL_COMMUNITY)
Admission: RE | Admit: 2020-01-17 | Discharge: 2020-01-17 | Disposition: A | Discharge status: Home / Self Care | Payer: Medicare Other | Source: Ambulatory Visit | Attending: Oncology | Admitting: Oncology

## 2020-01-17 DIAGNOSIS — Z20822 Contact with and (suspected) exposure to covid-19: Secondary | ICD-10-CM | POA: Diagnosis not present

## 2020-01-17 DIAGNOSIS — Z01812 Encounter for preprocedural laboratory examination: Secondary | ICD-10-CM | POA: Insufficient documentation

## 2020-01-17 LAB — SARS CORONAVIRUS 2 (TAT 6-24 HRS): SARS Coronavirus 2: NEGATIVE

## 2020-01-19 ENCOUNTER — Other Ambulatory Visit: Payer: Self-pay | Admitting: Medical

## 2020-01-19 ENCOUNTER — Other Ambulatory Visit: Payer: Self-pay | Admitting: Radiology

## 2020-01-19 ENCOUNTER — Other Ambulatory Visit: Payer: Self-pay

## 2020-01-19 ENCOUNTER — Inpatient Hospital Stay: Payer: Medicare Other | Attending: Oncology | Admitting: Oncology

## 2020-01-19 VITALS — BP 106/62 | HR 107 | Temp 97.7°F | Resp 17 | Ht 67.0 in | Wt 139.0 lb

## 2020-01-19 DIAGNOSIS — R630 Anorexia: Secondary | ICD-10-CM | POA: Insufficient documentation

## 2020-01-19 DIAGNOSIS — J449 Chronic obstructive pulmonary disease, unspecified: Secondary | ICD-10-CM | POA: Insufficient documentation

## 2020-01-19 DIAGNOSIS — R634 Abnormal weight loss: Secondary | ICD-10-CM | POA: Insufficient documentation

## 2020-01-19 DIAGNOSIS — R918 Other nonspecific abnormal finding of lung field: Secondary | ICD-10-CM

## 2020-01-19 DIAGNOSIS — R599 Enlarged lymph nodes, unspecified: Secondary | ICD-10-CM

## 2020-01-19 DIAGNOSIS — I82890 Acute embolism and thrombosis of other specified veins: Secondary | ICD-10-CM

## 2020-01-19 DIAGNOSIS — C251 Malignant neoplasm of body of pancreas: Secondary | ICD-10-CM | POA: Diagnosis not present

## 2020-01-19 DIAGNOSIS — I4892 Unspecified atrial flutter: Secondary | ICD-10-CM | POA: Insufficient documentation

## 2020-01-19 DIAGNOSIS — C259 Malignant neoplasm of pancreas, unspecified: Secondary | ICD-10-CM

## 2020-01-19 DIAGNOSIS — Z5111 Encounter for antineoplastic chemotherapy: Secondary | ICD-10-CM | POA: Insufficient documentation

## 2020-01-19 DIAGNOSIS — G893 Neoplasm related pain (acute) (chronic): Secondary | ICD-10-CM | POA: Diagnosis not present

## 2020-01-19 DIAGNOSIS — F17210 Nicotine dependence, cigarettes, uncomplicated: Secondary | ICD-10-CM

## 2020-01-19 MED ORDER — OXYCODONE HCL 5 MG PO TABS
ORAL_TABLET | ORAL | 0 refills | Status: DC
Start: 2020-01-19 — End: 2020-02-11

## 2020-01-19 NOTE — Progress Notes (Signed)
Met with patient and her husband Shanon Brow today at her initial medical oncology consult with Dr. Julieanne Manson.  She was a book on Pancreatic Cancer by the [place].  She was shown a model of a port a cath and this is scheduled for tomorrow 01/20/2020.  She also was given information on Advanced Directives and my card with my direct contact information.   She verbalized an understanding of the plan to have a PET scan to determine if there is metastatic lung disease.  We will present her at our Tumor Board conference on 01/28/2020. Referrals were made to CSW and our nutritionist.

## 2020-01-19 NOTE — Progress Notes (Signed)
After visit w/MD, she reports her stress score "2". Still sent referral to CSW.

## 2020-01-19 NOTE — Progress Notes (Signed)
Rebecca Weaver Consult   Requesting MD: Nickola Major, Md [address] [place],  [place] [address]   Rebecca Weaver 69 y.o.  [birth year]    Reason for Consult: Pancreas cancer   HPI: Mr. [name] reports a 3-week history of mid upper abdominal pain and back pain.  She saw her primary provider and was noted to have atrial flutter.  She was referred to the emergency room.  A CT angiography of the chest, abdomen, and pelvis on 12/29/2019 revealed no evidence of pulmonary embolism.  An ill-defined hypodense lesion was noted in the body the pancreas.  Multiple bilateral pulmonary nodules measuring up to 11 mm in the right upper lobe concerning for malignancy.  There is colonic diverticulosis. Dr. [name] was consulted.  An MRI of the liver on 12/30/2019 revealed a hypoenhancing mass in the posterior pancreas body abutting the anterior margin of the SMA.  The right margin the mass is tangential to the edge of the portal vein.  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  No evidence of metastatic disease in the abdomen.  She was seen by cardiology in the hospital.  She was transitioned to oral Cardizem.  She is scheduled for a cardioversion procedure.  She was taken to an endoscopic ultrasound by Dr. [name] on 01/02/2020.  An irregular mass was noted in the pancreas body there was a lack of portal vein invasion.  A cyst was identified in the pancreas body.  An FNA biopsy was not possible as the portal vein/portal confluence was in close proximity to the lesion.  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  She was referred for a CT-guided biopsy of the pancreas mass on 01/09/2020. The cytology confirmed malignant cells consistent with adenocarcinoma.  She continues to have abdomen/back pain.  The pain is not relieved with Percocet taken 3 times per day.   Past Medical History:  Diagnosis Date  . Anemia   . Arthritis   . COPD (chronic obstructive  pulmonary disease) ([place])   . Coronary artery calcification   . GERD (gastroesophageal reflux disease)   . Hyperlipidemia   . Hypertension   . Lung nodules   . Osteopenia   . Pseudotumor cerebri     .  G2 P2   .  Atrial flutter                                                                                                                          October 2021   .  Venous varicosities  Past Surgical History:  Procedure Laterality Date  . CHOLECYSTECTOMY    . ESOPHAGOGASTRODUODENOSCOPY (EGD) WITH PROPOFOL N/A 01/02/2020   Procedure: ESOPHAGOGASTRODUODENOSCOPY (EGD) WITH PROPOFOL;  Surgeon: [name], MD;  Location: [facility] END[facility]COPY;  Service: Endoscopy;  Laterality: N/A;  . FINE NEEDLE ASPIRATION N/A 01/02/2020   Procedure: FINE NEEDLE ASPIRATION (FNA) LINEAR;  Surgeon: [name], MD;  Location: [facility];  Service: Endoscopy;  Laterality: N/A;  . TUBAL LIGATION    . UPPER ESOPHAGEAL ENDOSCOPIC ULTRASOUND (EUS) N/A 01/02/2020   Procedure: UPPER ESOPHAGEAL ENDOSCOPIC ULTRASOUND (EUS);  Surgeon: [name], MD;  Location: [facility];  Service: Endoscopy;  Laterality: N/A;  . VARICOSE VEIN SURGERY     multiple    Medications: Reviewed  Allergies:  Allergies  Allergen Reactions  . Atorvastatin     Body aches  . Ciprofloxacin     Per Weaver  "swollen tongue and breathing problems"   . Sulfa Antibiotics     Per Weaver "swollen tongue and breathing problems"    Family history: No family history of cancer  Social History:   She lives with her husband in [place].  She is retired.  She previously worked as a Secretary/administrator.  She smokes 3/4 packs of cigarettes per day for 52 years, social alcohol use.  No transfusion history.  No risk factor for HIV or hepatitis.  ROS:   Positives include: Anorexia, 10 pound weight loss over the past month, nausea  after a large meal, mid abdomen and back pain, recent "tooth abscess " A complete ROS was otherwise negative.  Physical Exam:  Blood pressure 106/62, pulse (!) 107, temperature 97.7 F (36.5 C), temperature source Tympanic, resp. rate 17, height 5\' 7"  (1.702 m), weight 139 lb (63 kg), SpO2 98 %.  HEENT: Neck without mass Lungs: Distant breath sounds, no respiratory distress Cardiac: Irregular Abdomen: No hepatosplenomegaly, no apparent ascites, no mass, tender in the mid upper abdomen  Vascular: Bilateral lower leg varicosities with edema at the left greater than right lower leg Lymph nodes: No cervical, supraclavicular, axillary, or inguinal nodes Neurologic: Alert and oriented, the motor exam appears intact in the upper and lower extremities bilaterally Skin: No rash Musculoskeletal: No spine tenderness   LAB:  CBC  Lab Results  Component Value Date   WBC 9.8 01/14/2020   HGB 14.8 01/14/2020   HCT 42.6 01/14/2020   MCV 98 (H) 01/14/2020   PLT 275 01/14/2020   NEUTROABS 6.8 01/14/2020        CMP  Lab Results  Component Value Date   NA 134 01/14/2020   K 4.9 01/14/2020   CL 92 (L) 01/14/2020   CO2 25 01/14/2020   GLUCOSE 100 (H) 01/14/2020   BUN 9 01/14/2020   CREATININE 0.74 01/14/2020   CALCIUM 9.6 01/14/2020   PROT 6.1 (L) 12/30/2019   ALBUMIN 3.3 (L) 12/31/2019   AST 16 12/30/2019   ALT 17 12/30/2019   ALKPHOS 48 12/30/2019   BILITOT 1.0 12/30/2019   GFRNONAA 84 01/14/2020   GFRAA 97 01/14/2020   CA 19-9 on 12/30/2019: 19   Imaging:  As per HPI, CT images from 12/29/2019 reviewed with [name]   Assessment/Plan:   1. Pancreas cancer, adenocarcinoma the pancreas body, status post a CT-guided biopsy 01/09/2020  CTs 12/29/2019-pancreas body mass adjacent to the SMA, 11 mm right upper lobe nodule and 6 mm left upper lobe nodule as well as other tiny lung nodules new compared to a CT from 11/21/2017.  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   Abdomen/back pain secondary to #1 3.   Anorexia/weight loss secondary to #1 4.   Atrial flutter 5.   COPD  Disposition:   Ms. Smoak has been diagnosed with pancreas cancer.  She appears to have borderline resectable disease, but there may be metastatic disease involving the lungs.  The lung nodules could be benign or she could have a primary lung cancer.  We discussed treatment of pancreas cancer.  I recommend neoadjuvant chemotherapy if she is proven to have localized disease.  If she is not a surgical candidate we can consider chemotherapy and definitive radiation.  If the PET scan suggest metastatic disease we will consider gemcitabine/Abraxane versus FOLFIRINOX.  She will be referred for a staging PET scan and return for an office visit next week.  I prescribed oxycodone, 5 to 10 mg every 4 hours as needed, for pain.  She will let us know if this does not provide adequate pain relief.  Her case will be presented at the GI tumor conference on 01/28/2020.  She is scheduled for Port-A-Cath placement tomorrow.  She will be scheduled for a cardioversion to be followed by anticoagulation therapy.  Betsy Coder, MD  01/19/2020, 5:22 PM

## 2020-01-20 ENCOUNTER — Other Ambulatory Visit: Payer: Self-pay

## 2020-01-20 ENCOUNTER — Ambulatory Visit (HOSPITAL_COMMUNITY)
Admission: RE | Admit: 2020-01-20 | Discharge: 2020-01-20 | Disposition: A | Discharge status: Home / Self Care | Payer: Medicare Other | Source: Ambulatory Visit | Attending: Oncology | Admitting: Oncology

## 2020-01-20 ENCOUNTER — Encounter (HOSPITAL_COMMUNITY): Payer: Self-pay

## 2020-01-20 DIAGNOSIS — Z881 Allergy status to other antibiotic agents status: Secondary | ICD-10-CM | POA: Diagnosis not present

## 2020-01-20 DIAGNOSIS — Z882 Allergy status to sulfonamides status: Secondary | ICD-10-CM | POA: Diagnosis not present

## 2020-01-20 DIAGNOSIS — C259 Malignant neoplasm of pancreas, unspecified: Secondary | ICD-10-CM | POA: Diagnosis present

## 2020-01-20 HISTORY — PX: IR IMAGING GUIDED PORT INSERTION: IMG5740

## 2020-01-20 MED ORDER — FENTANYL CITRATE (PF) 100 MCG/2ML IJ SOLN
INTRAMUSCULAR | Status: AC | PRN
Start: 2020-01-20 — End: 2020-01-20
  Administered 2020-01-20 (×2): 50 ug via INTRAVENOUS

## 2020-01-20 MED ORDER — LIDOCAINE HCL (PF) 1 % IJ SOLN
INTRAMUSCULAR | Status: AC | PRN
  Administered 2020-01-20: 15 mL

## 2020-01-20 MED ORDER — CEFAZOLIN SODIUM-DEXTROSE 2-4 GM/100ML-% IV SOLN: 2.0000 g | Freq: Once | INTRAVENOUS | Status: AC

## 2020-01-20 MED ORDER — LIDOCAINE-EPINEPHRINE 1 %-1:100000 IJ SOLN
INTRAMUSCULAR | Status: AC
  Filled 2020-01-20: qty 1

## 2020-01-20 MED ORDER — SODIUM CHLORIDE 0.9 % IV SOLN: INTRAVENOUS | Status: DC

## 2020-01-20 MED ORDER — MIDAZOLAM HCL 2 MG/2ML IJ SOLN
INTRAMUSCULAR | Status: AC | PRN
  Administered 2020-01-20 (×2): 1 mg via INTRAVENOUS

## 2020-01-20 MED ORDER — FENTANYL CITRATE (PF) 100 MCG/2ML IJ SOLN
INTRAMUSCULAR | Status: AC
  Filled 2020-01-20: qty 2

## 2020-01-20 MED ORDER — MIDAZOLAM HCL 2 MG/2ML IJ SOLN
INTRAMUSCULAR | Status: AC
  Filled 2020-01-20: qty 4

## 2020-01-20 MED ORDER — CEFAZOLIN SODIUM-DEXTROSE 2-4 GM/100ML-% IV SOLN
INTRAVENOUS | Status: AC
  Administered 2020-01-20: 2 g via INTRAVENOUS
  Filled 2020-01-20: qty 100

## 2020-01-20 MED ORDER — HEPARIN SOD (PORK) LOCK FLUSH 100 UNIT/ML IV SOLN
INTRAVENOUS | Status: AC
  Filled 2020-01-20: qty 5

## 2020-01-20 MED ORDER — HEPARIN SOD (PORK) LOCK FLUSH 100 UNIT/ML IV SOLN
INTRAVENOUS | Status: AC | PRN
  Administered 2020-01-20: 500 [IU] via INTRAVENOUS

## 2020-01-20 NOTE — H&P (Signed)
Referring Physician(s): Ladell Pier  Supervising Physician: Arne Cleveland  Patient Status:  [facility] OP  Chief Complaint: "I'm getting a port a cath"   Subjective: Patient familiar to IR service from pancreatic mass biopsy on 01/09/2020.  She has a history of newly diagnosed pancreatic cancer and presents again today for Port-A-Cath placement for chemotherapy.  Additional medical history as below.  She currently denies fever, headache, chest pain, worsening dyspnea, cough, back pain, nausea, vomiting or bleeding.  She does have some epigastric discomfort.  Past Medical History:  Diagnosis Date  . Anemia   . Arthritis   . COPD (chronic obstructive pulmonary disease) ([place])   . Coronary artery calcification   . GERD (gastroesophageal reflux disease)   . Hyperlipidemia   . Hypertension   . Lung nodules   . Osteopenia   . Pseudotumor cerebri    Past Surgical History:  Procedure Laterality Date  . CHOLECYSTECTOMY    . ESOPHAGOGASTRODUODENOSCOPY (EGD) WITH PROPOFOL N/A 01/02/2020   Procedure: ESOPHAGOGASTRODUODENOSCOPY (EGD) WITH PROPOFOL;  Surgeon: [name], MD;  Location: [facility];  Service: Endoscopy;  Laterality: N/A;  . FINE NEEDLE ASPIRATION N/A 01/02/2020   Procedure: FINE NEEDLE ASPIRATION (FNA) LINEAR;  Surgeon: [name], MD;  Location: [facility];  Service: Endoscopy;  Laterality: N/A;  . TUBAL LIGATION    . UPPER ESOPHAGEAL ENDOSCOPIC ULTRASOUND (EUS) N/A 01/02/2020   Procedure: UPPER ESOPHAGEAL ENDOSCOPIC ULTRASOUND (EUS);  Surgeon: [name], MD;  Location: [facility];  Service: Endoscopy;  Laterality: N/A;  . VARICOSE VEIN SURGERY     multiple      Allergies: Atorvastatin, Ciprofloxacin, and Sulfa antibiotics  Medications: Prior to Admission medications   Medication Sig Start Date End Date Taking? Authorizing Provider  acetaminophen (TYLENOL) 500 MG tablet Take 1,000 mg by mouth every 6 (six) hours as needed for moderate pain or  headache.   Yes [provider]  benazepril (LOTENSIN) 20 MG tablet Take 20 mg by mouth daily.   Yes [provider]  Cyanocobalamin (B-12 COMPLIANCE INJECTION IJ) Inject 1 Dose as directed every 30 (thirty) days.   Yes [provider]  diltiazem (CARDIZEM CD) 120 MG 24 hr capsule Take 2 capsules (240 mg total) by mouth daily. 01/15/20 07/13/20 Yes O'Neal, Cassie Freer, MD  ezetimibe (ZETIA) 10 MG tablet Take 10 mg by mouth daily.   Yes [provider]  famotidine (PEPCID) 20 MG tablet Take 20 mg by mouth daily.   Yes [provider]  fluticasone (FLONASE) 50 MCG/ACT nasal spray Place 1 spray into both nostrils 2 (two) times daily.    Yes [provider]  Fluticasone-Salmeterol (ADVAIR) 250-50 MCG/DOSE AEPB Inhale 1 puff into the lungs 2 (two) times daily.   Yes [provider]  furosemide (LASIX) 20 MG tablet Take 1 tablet (20 mg total) by mouth daily. 01/14/20 04/13/20 Yes O'Neal, Cassie Freer, MD  loratadine (CLARITIN) 10 MG tablet Take 10 mg by mouth daily.   Yes [provider]  Naphazoline-Pheniramine (VISINE-A OP) Place 1 drop into both eyes daily.   Yes [provider]  oxyCODONE (OXY IR/ROXICODONE) 5 MG immediate release tablet 1 to 2 PO Q 4 hours prn pain 01/19/20  Yes Tanner, Lyndon Code., PA-C  apixaban (ELIQUIS) 5 MG TABS tablet Take 1 tablet (5 mg total) by mouth 2 (two) times daily. Do not start this until you have the pancreatic biopsy done by GI. Patient not taking: Reported on 01/19/2020 01/14/20 07/12/20  Geralynn Rile, MD  Multiple Vitamin (MULTIVITAMIN WITH MINERALS) TABS tablet Take 1 tablet by mouth daily. Patient not taking: Reported on 01/19/2020    [provider]     Vital Signs: BP (!) 121/94   Pulse (!) 122 Comment: RN aware  Temp (!) 97.5 F (36.4 C) (Oral)   Resp 18   SpO2 94%   Physical Exam awake, alert.  Chest with distant breath sounds bilaterally.  Heart with  tachycardic/irregular rhythm.  Abdomen soft, positive bowel sounds, tender epigastric region to palpation.  Left greater than right lower extremity edema  Imaging: No results found.  Labs:  CBC: Recent Labs    12/29/19 1821 12/30/19 0500 01/09/20 1003 01/14/20 1509  WBC 8.0 7.0 8.5 9.8  HGB 15.0 14.5 16.1* 14.8  HCT 44.9 44.7 48.6* 42.6  PLT 199 164 293 275    COAGS: Recent Labs    01/09/20 1003  INR 1.1    BMP: Recent Labs    12/29/19 1821 12/30/19 0511 12/31/19 0342 01/14/20 1509  NA 137 138 139 134  K 3.7 3.8 3.6 4.9  CL 101 101 103 92*  CO2 24 28 26 25   GLUCOSE 81 94 98 100*  BUN 15 11 8 9   CALCIUM 9.0 8.9 8.8* 9.6  CREATININE 0.79 0.78 0.67 0.74  GFRNONAA >60 >60 >60 84  GFRAA  --   --   --  97    LIVER FUNCTION TESTS: Recent Labs    12/29/19 1821 12/30/19 0511 12/31/19 0342  BILITOT 1.1 1.0  --   AST 16 16  --   ALT 16 17  --   ALKPHOS 52 48  --   PROT 6.0* 6.1*  --   ALBUMIN 3.7 3.7 3.3*    Assessment and Plan: Patient with history of newly diagnosed pancreatic cancer; presents today for Port-A-Cath placement for chemotherapy.Risks and benefits of image guided port-a-catheter placement was discussed with the patient including, but not limited to bleeding, infection, pneumothorax, or fibrin sheath development and need for additional procedures.  All of the patient's questions were answered, patient is agreeable to proceed. Consent signed and in chart.  Patient also with history of atrial flutter, scheduled for cardioversion on 11/15.   Electronically Signed: D. Rowe Robert, PA-C 01/20/2020, 1:00 PM   I spent a total of 25 minutes at the the patient's bedside AND on the patient's hospital floor or unit, greater than 50% of which was counseling/coordinating care for Port-A-Cath placement

## 2020-01-20 NOTE — Discharge Instructions (Signed)

## 2020-01-20 NOTE — Procedures (Signed)
  Procedure: R IJ Port placement   EBL:   minimal Complications:  none immediate  See full dictation in [facility].  Dillard Cannon MD Main # [phone] Pager  [phone] Mobile [phone]

## 2020-01-21 ENCOUNTER — Ambulatory Visit (HOSPITAL_COMMUNITY): Admit: 2020-01-21 | Payer: Medicare Other | Admitting: Internal Medicine

## 2020-01-21 ENCOUNTER — Telehealth: Payer: Self-pay | Admitting: Cardiovascular Disease

## 2020-01-21 ENCOUNTER — Other Ambulatory Visit: Payer: Self-pay

## 2020-01-21 ENCOUNTER — Encounter (HOSPITAL_COMMUNITY): Payer: Self-pay

## 2020-01-21 ENCOUNTER — Ambulatory Visit (HOSPITAL_COMMUNITY)
Admission: RE | Admit: 2020-01-21 | Discharge: 2020-01-21 | Disposition: A | Discharge status: Home / Self Care | Payer: Medicare Other | Source: Ambulatory Visit | Attending: Oncology | Admitting: Oncology

## 2020-01-21 DIAGNOSIS — C259 Malignant neoplasm of pancreas, unspecified: Secondary | ICD-10-CM

## 2020-01-21 DIAGNOSIS — D259 Leiomyoma of uterus, unspecified: Secondary | ICD-10-CM | POA: Diagnosis not present

## 2020-01-21 DIAGNOSIS — J439 Emphysema, unspecified: Secondary | ICD-10-CM | POA: Diagnosis not present

## 2020-01-21 DIAGNOSIS — I700 Atherosclerosis of aorta: Secondary | ICD-10-CM | POA: Diagnosis not present

## 2020-01-21 DIAGNOSIS — I2510 Atherosclerotic heart disease of native coronary artery without angina pectoris: Secondary | ICD-10-CM | POA: Insufficient documentation

## 2020-01-21 LAB — GLUCOSE, CAPILLARY: Glucose-Capillary: 105 mg/dL — ABNORMAL HIGH (ref 70–99)

## 2020-01-21 SURGERY — ECHOCARDIOGRAM, TRANSESOPHAGEAL
Anesthesia: General

## 2020-01-21 MED ORDER — FLUDEOXYGLUCOSE F - 18 (FDG) INJECTION
7.6600 | Freq: Once | INTRAVENOUS | Status: AC
  Administered 2020-01-21: 7.66 via INTRAVENOUS

## 2020-01-21 NOTE — Progress Notes (Unsigned)
Clicked this by mistake.

## 2020-01-21 NOTE — Telephone Encounter (Signed)
Pt called in and state she had a port put in yesterday.  She has been off of the apixaban (ELIQUIS) 5 MG TABS tablet [[id number]] since Saturday.  She would like to know when she is suppose to go back on it?     Best number  [phone]

## 2020-01-21 NOTE — Telephone Encounter (Signed)
Followed up with Dr. Arne Cleveland from IR, he recommends pt resume eliquis with next scheduled dose starting this morning.  Per Dr. Audie Box pt also scheduled for a TEE/ Cardioversion (originally scheduled for 01/21/20) appointment switched to 11/15 per Darrell Allred, PA-C note, upon additional investigation unable to find said appointment, confirmed with scheduling that appointment was cancelled and never re-scheduled for another date. Will follow up with Dr. Audie Box on how to proceed.

## 2020-01-21 NOTE — Telephone Encounter (Signed)
Pt has an appointment scheduled with Dr. Audie Box in the office for 11/15 at 11:20am. Will see her then and discuss TEE/Cardioversion at this time.

## 2020-01-22 ENCOUNTER — Encounter: Payer: Self-pay | Admitting: General Practice

## 2020-01-22 NOTE — Progress Notes (Signed)
[facility] Psychosocial Distress Screening Clinical Social Work  Clinical Social Work was referred by distress screening protocol.  The patient scored a 8 on the Psychosocial Distress Thermometer which indicates moderate distress. Clinical Social Worker contacted patient by phone to assess for distress and other psychosocial needs. Overall, she reports she is doing well.  Awaiting results of recent scans, then will have better understanding of treatment plan.  Moved here 6 years ago, now her daughters and grandchildren also live here.  She reports great support from family and friends, appreciates having the [facility] team as well.  No current concerns. CSW and patient discussed common feeling and emotions when being diagnosed with cancer, and the importance of support during treatment. CSW informed patient of the support team and support services at [facility]. CSW provided contact information and encouraged patient to call with any questions or concerns.  ONCBCN DISTRESS SCREENING 01/19/2020  Screening Type Initial Screening  Distress experienced in past week (1-10) 8  Family Problem type Other (comment)  Emotional problem type Adjusting to illness  Spiritual/Religous concerns type Relating to God  Physical Problem type Loss of appetitie;Skin dry/itchy  Physician notified of physical symptoms Yes  Referral to clinical psychology No  Referral to clinical social work Yes  Referral to dietition Yes  Referral to financial advocate No  Referral to support programs No  Referral to palliative care No    Clinical Social Worker follow up needed: No.  If yes, follow up plan:  Beverely Pace, [place], LCSW Clinical Social Worker Phone:  [phone]

## 2020-01-23 NOTE — H&P (View-Only) (Signed)
Cardiology Office Note:   Date:  01/26/2020  NAME:  Rebecca Weaver    MRN: [id number] DOB:  08-09-1950   PCP:  Nickola Major, MD  Cardiologist:  Evalina Field, MD   Referring MD: Nickola Major, MD   Chief Complaint  Patient presents with  . Follow-up    2 week.   History of Present Illness:   Rebecca Weaver is a 69 y.o. female with a hx of COPD, newly diagnosed pancreatic cancer, atrial flutter who presents for follow-up.  She was seen several weeks ago with a new diagnosis of atrial flutter.  She is also been diagnosed with pancreatic cancer.  We did delay treatment of her atrial flutter to get her evaluated by oncology.  She has had a port placed for chemotherapy.  She is back on Eliquis.  She reports she is doing well.  She was started on Lasix by her oncologist.  She does have some left lower extremity edema more than right.  Apparently this has been the case for years.  She still smoking but cut back quickly.  EKG shows she is still in atrial flutter with 2 1 block at 141 bpm.  Surprisingly she has no symptoms of heart failure or shortness of breath.  We will get her on the schedule for TEE/cardioversion later this week.  Problem List 1. COPD 2. HTN 3. Pancreatic adenocarcinoma  4. Atrial flutter -Dx 12/2019 -CHADSVASC=3 (age, sex, HTN) -Ef 50-55% 5. Tobacco abuse   Past Medical History: Past Medical History:  Diagnosis Date  . Anemia   . Arthritis   . COPD (chronic obstructive pulmonary disease) ([place])   . Coronary artery calcification   . GERD (gastroesophageal reflux disease)   . Hyperlipidemia   . Hypertension   . Lung nodules   . Osteopenia   . Pseudotumor cerebri     Past Surgical History: Past Surgical History:  Procedure Laterality Date  . CHOLECYSTECTOMY    . ESOPHAGOGASTRODUODENOSCOPY (EGD) WITH PROPOFOL N/A 01/02/2020   Procedure: ESOPHAGOGASTRODUODENOSCOPY (EGD) WITH PROPOFOL;  Surgeon: [name], MD;  Location: [facility];   Service: Endoscopy;  Laterality: N/A;  . FINE NEEDLE ASPIRATION N/A 01/02/2020   Procedure: FINE NEEDLE ASPIRATION (FNA) LINEAR;  Surgeon: [name], MD;  Location: [facility];  Service: Endoscopy;  Laterality: N/A;  . IR IMAGING GUIDED PORT INSERTION  01/20/2020  . TUBAL LIGATION    . UPPER ESOPHAGEAL ENDOSCOPIC ULTRASOUND (EUS) N/A 01/02/2020   Procedure: UPPER ESOPHAGEAL ENDOSCOPIC ULTRASOUND (EUS);  Surgeon: [name], MD;  Location: [facility];  Service: Endoscopy;  Laterality: N/A;  . VARICOSE VEIN SURGERY     multiple    Current Medications: Current Meds  Medication Sig  . acetaminophen (TYLENOL) 500 MG tablet Take 1,000 mg by mouth every 6 (six) hours as needed for moderate pain or headache.  Marland Kitchen apixaban (ELIQUIS) 5 MG TABS tablet Take 1 tablet (5 mg total) by mouth 2 (two) times daily. Do not start this until you have the pancreatic biopsy done by GI.  . benazepril (LOTENSIN) 20 MG tablet Take 20 mg by mouth daily.  . Cyanocobalamin (B-12 COMPLIANCE INJECTION IJ) Inject 1 Dose as directed every 30 (thirty) days.  Marland Kitchen diltiazem (CARDIZEM CD) 120 MG 24 hr capsule Take 2 capsules (240 mg total) by mouth daily.  Marland Kitchen ezetimibe (ZETIA) 10 MG tablet Take 10 mg by mouth daily.  . famotidine (PEPCID) 20 MG tablet Take 20 mg by mouth daily.  . fluticasone (FLONASE)  50 MCG/ACT nasal spray Place 1 spray into both nostrils 2 (two) times daily.   . Fluticasone-Salmeterol (ADVAIR) 250-50 MCG/DOSE AEPB Inhale 1 puff into the lungs 2 (two) times daily.  . furosemide (LASIX) 20 MG tablet Take 1 tablet (20 mg total) by mouth daily.  Marland Kitchen loratadine (CLARITIN) 10 MG tablet Take 10 mg by mouth daily.  . Multiple Vitamin (MULTIVITAMIN WITH MINERALS) TABS tablet Take 1 tablet by mouth daily.   Marland Kitchen Naphazoline-Pheniramine (VISINE-A OP) Place 1 drop into both eyes daily.  Marland Kitchen oxyCODONE (OXY IR/ROXICODONE) 5 MG immediate release tablet 1 to 2 PO Q 4 hours prn pain     Allergies:    Atorvastatin,  Ciprofloxacin, and Sulfa antibiotics   Social History: Social History   Socioeconomic History  . Marital status: Married    Spouse name: Not on file  . Number of children: 2  . Years of education: Not on file  . Highest education level: Not on file  Occupational History  . Occupation: retired - Development worker, community  Tobacco Use  . Smoking status: Current Every Day Smoker    Packs/day: 0.75    Years: 50.00    Pack years: 37.50    Types: Cigarettes  . Smokeless tobacco: Never Used  Substance and Sexual Activity  . Alcohol use: Yes    Alcohol/week: 0.0 standard drinks    Comment: social occassions only  . Drug use: Not Currently  . Sexual activity: Not on file  Other Topics Concern  . Not on file  Social History Narrative  . Not on file   Social Determinants of Health   Financial Resource Strain: Low Risk   . Difficulty of Paying Living Expenses: Not hard at all  Food Insecurity:   . Worried About Charity fundraiser in the Last Year: Not on file  . Ran Out of Food in the Last Year: Not on file  Transportation Needs: No Transportation Needs  . Lack of Transportation (Medical): No  . Lack of Transportation (Non-Medical): No  Physical Activity: Insufficiently Active  . Days of Exercise per Week: 5 days  . Minutes of Exercise per Session: 20 min  Stress: No Stress Concern Present  . Feeling of Stress : Not at all  Social Connections: Unknown  . Frequency of Communication with Friends and Family: More than three times a week  . Frequency of Social Gatherings with Friends and Family: More than three times a week  . Attends Religious Services: Not on file  . Active Member of Clubs or Organizations: Not on file  . Attends Archivist Meetings: Not on file  . Marital Status: Married     Family History: The patient's family history includes Heart disease in her father; Varicose Veins in her father and sister.  ROS:   All other ROS reviewed and negative. Pertinent  positives noted in the HPI.     EKGs/Labs/Other Studies Reviewed:   The following studies were personally reviewed by me today:  EKG:  EKG is ordered today.  The ekg ordered today demonstrates atrial flutter, heart rate 141, right axis deviation noted, and was personally reviewed by me.   TTE 12/30/19 1. Left ventricular ejection fraction, by estimation, is 50 to 55%. The  left ventricle has low normal function. The left ventricle has no regional  wall motion abnormalities. Left ventricular diastolic parameters are  indeterminate.  2. Right ventricular systolic function is low normal. The right  ventricular size is mildly enlarged. Mildly increased right  ventricular  wall thickness.  3. Left atrial size was severely dilated.  4. The mitral valve is grossly normal. Trivial mitral valve  regurgitation.  5. The aortic valve is tricuspid. Aortic valve regurgitation is not  visualized.  6. The inferior vena cava is dilated in size with <50% respiratory  variability, suggesting right atrial pressure of 15 mmHg.   Recent Labs: 12/29/2019: B Natriuretic Peptide 65.4 12/30/2019: ALT 17; TSH 2.679 12/31/2019: Magnesium 1.7 01/14/2020: BUN 9; Creatinine, Ser 0.74; Hemoglobin 14.8; Platelets 275; Potassium 4.9; Sodium 134   Recent Lipid Panel No results found for: CHOL, TRIG, HDL, CHOLHDL, VLDL, LDLCALC, LDLDIRECT  Physical Exam:   VS:  BP 110/84 (BP Location: Left Arm, Patient Position: Sitting, Cuff Size: Normal)   Pulse (!) 141   Ht 5\' 7"  (1.702 m)   Wt 137 lb (62.1 kg)   BMI 21.46 kg/m    Wt Readings from Last 3 Encounters:  01/26/20 137 lb (62.1 kg)  01/19/20 139 lb (63 kg)  01/14/20 144 lb 12.8 oz (65.7 kg)    General: Well nourished, well developed, in no acute distress Heart: Atraumatic, normal size  Eyes: PEERLA, EOMI  Neck: Supple, no JVD Endocrine: No thryomegaly Cardiac: Normal S1, S2; regular rhythm, tachycardia noted Lungs: Clear to auscultation bilaterally,  no wheezing, rhonchi or rales  Abd: Soft, nontender, no hepatomegaly  Ext: Trace left lower extremity edema Musculoskeletal: No deformities, BUE and BLE strength normal and equal Skin: Warm and dry, no rashes   Neuro: Alert and oriented to person, place, time, and situation, CNII-XII grossly intact, no focal deficits  Psych: Normal mood and affect   ASSESSMENT:   Rebecca Weaver is a 69 y.o. female who presents for the following: 1. Atrial flutter with rapid ventricular response ([place])   2. Essential hypertension   3. Pre-procedure lab exam     PLAN:   1. Atrial flutter with rapid ventricular response ([facility]) -Recent diagnosis of pancreatic cancer.  Was also found to have atrial flutter.  TEE/cardioversion was delayed as she had port placement.  She is back on Eliquis.  We will proceed with TEE/cardioversion later this week.  She will continue her Eliquis.  She will take her Eliquis the day of her procedure.  She will continue her home diltiazem.  She is had normal thyroid studies.  Her echo shows her EF is preserved.  She has no symptoms of heart failure. -Overall I suspect the trigger for this was her pancreatic cancer.  Hopefully she will remain in sinus rhythm after TEE/cardioversion.  She was informed that any procedures or other things will need to be delayed for 4 weeks after cardioversion.  She is okay to proceed.  2. Essential hypertension -Well-controlled.  No change in medications.  Disposition: Return in about 1 month (around 02/25/2020).  Medication Adjustments/Labs and Tests Ordered: Current medicines are reviewed at length with the patient today.  Concerns regarding medicines are outlined above.  Orders Placed This Encounter  Procedures  . Basic metabolic panel  . CBC  . EKG 12-Lead   No orders of the defined types were placed in this encounter.   Patient Instructions  Medication Instructions:  NO CHANGES *If you need a refill on your cardiac medications before your  next appointment, please call your pharmacy*   Lab Work: TODAY BMET AND CBC If you have labs (blood work) drawn today and your tests are completely normal, you will receive your results only by: Marland Kitchen MyChart Message (if you have  MyChart) OR . A paper copy in the mail If you have any lab test that is abnormal or we need to change your treatment, we will call you to review the results.   Testing/Procedures: Your physician has requested that you have a TEE/Cardioversion. During a TEE, sound waves are used to create images of your heart. It provides your doctor with information about the size and shape of your heart and how well your heart's chambers and valves are working. In this test, a transducer is attached to the end of a flexible tube that is guided down you throat and into your esophagus (the tube leading from your mouth to your stomach) to get a more detailed image of your heart. Once the TEE has determined that a blood clot is not present, the cardioversion begins. Electrical Cardioversion uses a jolt of electricity to your heart either through paddles or wired patches attached to your chest. This is a controlled, usually prescheduled, procedure. This procedure is done at the hospital and you are not awake during the procedure. You usually go home the day of the procedure. Please see the instruction sheet given to you today for more information.    Follow-Up: At [facility], you and your health needs are our priority.  As part of our continuing mission to provide you with exceptional heart care, we have created designated Provider Care Teams.  These Care Teams include your primary Cardiologist (physician) and Advanced Practice Providers (APPs -  Physician Assistants and Nurse Practitioners) who all work together to provide you with the care you need, when you need it.  We recommend signing up for the patient portal called "MyChart".  Sign up information is provided on this After Visit  Summary.  MyChart is used to connect with patients for Virtual Visits (Telemedicine).  Patients are able to view lab/test results, encounter notes, upcoming appointments, etc.  Non-urgent messages can be sent to your provider as well.   To learn more about what you can do with MyChart, go to [email or web address].    Your next appointment:   1 month(s)  The format for your next appointment:   In Person  Provider:   Eleonore Chiquito, MD   Other Instructions NONE     Time Spent with Patient: I have spent a total of 35 minutes with patient reviewing hospital notes, telemetry, EKGs, labs and examining the patient as well as establishing an assessment and plan that was discussed with the patient.  > 50% of time was spent in direct patient care.  Signed, Addison Naegeli. Audie Box, [place]  [address], [place], [place] [address] [phone]  01/26/2020 11:51 AM

## 2020-01-23 NOTE — Progress Notes (Signed)
Cardiology Office Note:   Date:  01/26/2020  NAME:  Rebecca Weaver    MRN: [id number] DOB:  Jul 25, 1950   PCP:  Nickola Major, MD  Cardiologist:  Evalina Field, MD   Referring MD: Nickola Major, MD   Chief Complaint  Patient presents with  . Follow-up    2 week.   History of Present Illness:   Rebecca Weaver is a 69 y.o. female with a hx of COPD, newly diagnosed pancreatic cancer, atrial flutter who presents for follow-up.  She was seen several weeks ago with a new diagnosis of atrial flutter.  She is also been diagnosed with pancreatic cancer.  We did delay treatment of her atrial flutter to get her evaluated by oncology.  She has had a port placed for chemotherapy.  She is back on Eliquis.  She reports she is doing well.  She was started on Lasix by her oncologist.  She does have some left lower extremity edema more than right.  Apparently this has been the case for years.  She still smoking but cut back quickly.  EKG shows she is still in atrial flutter with 2 1 block at 141 bpm.  Surprisingly she has no symptoms of heart failure or shortness of breath.  We will get her on the schedule for TEE/cardioversion later this week.  Problem List 1. COPD 2. HTN 3. Pancreatic adenocarcinoma  4. Atrial flutter -Dx 12/2019 -CHADSVASC=3 (age, sex, HTN) -Ef 50-55% 5. Tobacco abuse   Past Medical History: Past Medical History:  Diagnosis Date  . Anemia   . Arthritis   . COPD (chronic obstructive pulmonary disease) ([place])   . Coronary artery calcification   . GERD (gastroesophageal reflux disease)   . Hyperlipidemia   . Hypertension   . Lung nodules   . Osteopenia   . Pseudotumor cerebri     Past Surgical History: Past Surgical History:  Procedure Laterality Date  . CHOLECYSTECTOMY    . ESOPHAGOGASTRODUODENOSCOPY (EGD) WITH PROPOFOL N/A 01/02/2020   Procedure: ESOPHAGOGASTRODUODENOSCOPY (EGD) WITH PROPOFOL;  Surgeon: [name], MD;  Location: [facility];   Service: Endoscopy;  Laterality: N/A;  . FINE NEEDLE ASPIRATION N/A 01/02/2020   Procedure: FINE NEEDLE ASPIRATION (FNA) LINEAR;  Surgeon: [name], MD;  Location: [facility];  Service: Endoscopy;  Laterality: N/A;  . IR IMAGING GUIDED PORT INSERTION  01/20/2020  . TUBAL LIGATION    . UPPER ESOPHAGEAL ENDOSCOPIC ULTRASOUND (EUS) N/A 01/02/2020   Procedure: UPPER ESOPHAGEAL ENDOSCOPIC ULTRASOUND (EUS);  Surgeon: [name], MD;  Location: [facility];  Service: Endoscopy;  Laterality: N/A;  . VARICOSE VEIN SURGERY     multiple    Current Medications: Current Meds  Medication Sig  . acetaminophen (TYLENOL) 500 MG tablet Take 1,000 mg by mouth every 6 (six) hours as needed for moderate pain or headache.  Marland Kitchen apixaban (ELIQUIS) 5 MG TABS tablet Take 1 tablet (5 mg total) by mouth 2 (two) times daily. Do not start this until you have the pancreatic biopsy done by GI.  . benazepril (LOTENSIN) 20 MG tablet Take 20 mg by mouth daily.  . Cyanocobalamin (B-12 COMPLIANCE INJECTION IJ) Inject 1 Dose as directed every 30 (thirty) days.  Marland Kitchen diltiazem (CARDIZEM CD) 120 MG 24 hr capsule Take 2 capsules (240 mg total) by mouth daily.  Marland Kitchen ezetimibe (ZETIA) 10 MG tablet Take 10 mg by mouth daily.  . famotidine (PEPCID) 20 MG tablet Take 20 mg by mouth daily.  . fluticasone (FLONASE)  50 MCG/ACT nasal spray Place 1 spray into both nostrils 2 (two) times daily.   . Fluticasone-Salmeterol (ADVAIR) 250-50 MCG/DOSE AEPB Inhale 1 puff into the lungs 2 (two) times daily.  . furosemide (LASIX) 20 MG tablet Take 1 tablet (20 mg total) by mouth daily.  Marland Kitchen loratadine (CLARITIN) 10 MG tablet Take 10 mg by mouth daily.  . Multiple Vitamin (MULTIVITAMIN WITH MINERALS) TABS tablet Take 1 tablet by mouth daily.   Marland Kitchen Naphazoline-Pheniramine (VISINE-A OP) Place 1 drop into both eyes daily.  Marland Kitchen oxyCODONE (OXY IR/ROXICODONE) 5 MG immediate release tablet 1 to 2 PO Q 4 hours prn pain     Allergies:    Atorvastatin,  Ciprofloxacin, and Sulfa antibiotics   Social History: Social History   Socioeconomic History  . Marital status: Married    Spouse name: Not on file  . Number of children: 2  . Years of education: Not on file  . Highest education level: Not on file  Occupational History  . Occupation: retired - Development worker, community  Tobacco Use  . Smoking status: Current Every Day Smoker    Packs/day: 0.75    Years: 50.00    Pack years: 37.50    Types: Cigarettes  . Smokeless tobacco: Never Used  Substance and Sexual Activity  . Alcohol use: Yes    Alcohol/week: 0.0 standard drinks    Comment: social occassions only  . Drug use: Not Currently  . Sexual activity: Not on file  Other Topics Concern  . Not on file  Social History Narrative  . Not on file   Social Determinants of Health   Financial Resource Strain: Low Risk   . Difficulty of Paying Living Expenses: Not hard at all  Food Insecurity:   . Worried About Charity fundraiser in the Last Year: Not on file  . Ran Out of Food in the Last Year: Not on file  Transportation Needs: No Transportation Needs  . Lack of Transportation (Medical): No  . Lack of Transportation (Non-Medical): No  Physical Activity: Insufficiently Active  . Days of Exercise per Week: 5 days  . Minutes of Exercise per Session: 20 min  Stress: No Stress Concern Present  . Feeling of Stress : Not at all  Social Connections: Unknown  . Frequency of Communication with Friends and Family: More than three times a week  . Frequency of Social Gatherings with Friends and Family: More than three times a week  . Attends Religious Services: Not on file  . Active Member of Clubs or Organizations: Not on file  . Attends Archivist Meetings: Not on file  . Marital Status: Married     Family History: The patient's family history includes Heart disease in her father; Varicose Veins in her father and sister.  ROS:   All other ROS reviewed and negative. Pertinent  positives noted in the HPI.     EKGs/Labs/Other Studies Reviewed:   The following studies were personally reviewed by me today:  EKG:  EKG is ordered today.  The ekg ordered today demonstrates atrial flutter, heart rate 141, right axis deviation noted, and was personally reviewed by me.   TTE 12/30/19 1. Left ventricular ejection fraction, by estimation, is 50 to 55%. The  left ventricle has low normal function. The left ventricle has no regional  wall motion abnormalities. Left ventricular diastolic parameters are  indeterminate.  2. Right ventricular systolic function is low normal. The right  ventricular size is mildly enlarged. Mildly increased right  ventricular  wall thickness.  3. Left atrial size was severely dilated.  4. The mitral valve is grossly normal. Trivial mitral valve  regurgitation.  5. The aortic valve is tricuspid. Aortic valve regurgitation is not  visualized.  6. The inferior vena cava is dilated in size with <50% respiratory  variability, suggesting right atrial pressure of 15 mmHg.   Recent Labs: 12/29/2019: B Natriuretic Peptide 65.4 12/30/2019: ALT 17; TSH 2.679 12/31/2019: Magnesium 1.7 01/14/2020: BUN 9; Creatinine, Ser 0.74; Hemoglobin 14.8; Platelets 275; Potassium 4.9; Sodium 134   Recent Lipid Panel No results found for: CHOL, TRIG, HDL, CHOLHDL, VLDL, LDLCALC, LDLDIRECT  Physical Exam:   VS:  BP 110/84 (BP Location: Left Arm, Patient Position: Sitting, Cuff Size: Normal)   Pulse (!) 141   Ht 5\' 7"  (1.702 m)   Wt 137 lb (62.1 kg)   BMI 21.46 kg/m    Wt Readings from Last 3 Encounters:  01/26/20 137 lb (62.1 kg)  01/19/20 139 lb (63 kg)  01/14/20 144 lb 12.8 oz (65.7 kg)    General: Well nourished, well developed, in no acute distress Heart: Atraumatic, normal size  Eyes: PEERLA, EOMI  Neck: Supple, no JVD Endocrine: No thryomegaly Cardiac: Normal S1, S2; regular rhythm, tachycardia noted Lungs: Clear to auscultation bilaterally,  no wheezing, rhonchi or rales  Abd: Soft, nontender, no hepatomegaly  Ext: Trace left lower extremity edema Musculoskeletal: No deformities, BUE and BLE strength normal and equal Skin: Warm and dry, no rashes   Neuro: Alert and oriented to person, place, time, and situation, CNII-XII grossly intact, no focal deficits  Psych: Normal mood and affect   ASSESSMENT:   Rebecca Weaver is a 69 y.o. female who presents for the following: 1. Atrial flutter with rapid ventricular response ([place])   2. Essential hypertension   3. Pre-procedure lab exam     PLAN:   1. Atrial flutter with rapid ventricular response ([facility]) -Recent diagnosis of pancreatic cancer.  Was also found to have atrial flutter.  TEE/cardioversion was delayed as she had port placement.  She is back on Eliquis.  We will proceed with TEE/cardioversion later this week.  She will continue her Eliquis.  She will take her Eliquis the day of her procedure.  She will continue her home diltiazem.  She is had normal thyroid studies.  Her echo shows her EF is preserved.  She has no symptoms of heart failure. -Overall I suspect the trigger for this was her pancreatic cancer.  Hopefully she will remain in sinus rhythm after TEE/cardioversion.  She was informed that any procedures or other things will need to be delayed for 4 weeks after cardioversion.  She is okay to proceed.  2. Essential hypertension -Well-controlled.  No change in medications.  Disposition: Return in about 1 month (around 02/25/2020).  Medication Adjustments/Labs and Tests Ordered: Current medicines are reviewed at length with the patient today.  Concerns regarding medicines are outlined above.  Orders Placed This Encounter  Procedures  . Basic metabolic panel  . CBC  . EKG 12-Lead   No orders of the defined types were placed in this encounter.   Patient Instructions  Medication Instructions:  NO CHANGES *If you need a refill on your cardiac medications before your  next appointment, please call your pharmacy*   Lab Work: TODAY BMET AND CBC If you have labs (blood work) drawn today and your tests are completely normal, you will receive your results only by: Marland Kitchen MyChart Message (if you have  MyChart) OR . A paper copy in the mail If you have any lab test that is abnormal or we need to change your treatment, we will call you to review the results.   Testing/Procedures: Your physician has requested that you have a TEE/Cardioversion. During a TEE, sound waves are used to create images of your heart. It provides your doctor with information about the size and shape of your heart and how well your heart's chambers and valves are working. In this test, a transducer is attached to the end of a flexible tube that is guided down you throat and into your esophagus (the tube leading from your mouth to your stomach) to get a more detailed image of your heart. Once the TEE has determined that a blood clot is not present, the cardioversion begins. Electrical Cardioversion uses a jolt of electricity to your heart either through paddles or wired patches attached to your chest. This is a controlled, usually prescheduled, procedure. This procedure is done at the hospital and you are not awake during the procedure. You usually go home the day of the procedure. Please see the instruction sheet given to you today for more information.    Follow-Up: At [facility], you and your health needs are our priority.  As part of our continuing mission to provide you with exceptional heart care, we have created designated Provider Care Teams.  These Care Teams include your primary Cardiologist (physician) and Advanced Practice Providers (APPs -  Physician Assistants and Nurse Practitioners) who all work together to provide you with the care you need, when you need it.  We recommend signing up for the patient portal called "MyChart".  Sign up information is provided on this After Visit  Summary.  MyChart is used to connect with patients for Virtual Visits (Telemedicine).  Patients are able to view lab/test results, encounter notes, upcoming appointments, etc.  Non-urgent messages can be sent to your provider as well.   To learn more about what you can do with MyChart, go to [email or web address].    Your next appointment:   1 month(s)  The format for your next appointment:   In Person  Provider:   Eleonore Chiquito, MD   Other Instructions NONE     Time Spent with Patient: I have spent a total of 35 minutes with patient reviewing hospital notes, telemetry, EKGs, labs and examining the patient as well as establishing an assessment and plan that was discussed with the patient.  > 50% of time was spent in direct patient care.  Signed, Addison Naegeli. Audie Box, [place]  [address], [place], [place] [address] [phone]  01/26/2020 11:51 AM

## 2020-01-26 ENCOUNTER — Encounter: Payer: Self-pay | Admitting: *Deleted

## 2020-01-26 ENCOUNTER — Encounter: Payer: Self-pay | Admitting: Cardiovascular Disease

## 2020-01-26 ENCOUNTER — Ambulatory Visit (INDEPENDENT_AMBULATORY_CARE_PROVIDER_SITE_OTHER): Payer: Medicare Other | Admitting: Cardiovascular Disease

## 2020-01-26 VITALS — BP 110/84 | HR 141 | Ht 67.0 in | Wt 137.0 lb

## 2020-01-26 DIAGNOSIS — I4892 Unspecified atrial flutter: Secondary | ICD-10-CM

## 2020-01-26 DIAGNOSIS — Z01812 Encounter for preprocedural laboratory examination: Secondary | ICD-10-CM

## 2020-01-26 DIAGNOSIS — I10 Essential (primary) hypertension: Secondary | ICD-10-CM

## 2020-01-26 NOTE — Patient Instructions (Signed)
Medication Instructions:  NO CHANGES *If you need a refill on your cardiac medications before your next appointment, please call your pharmacy*   Lab Work: TODAY BMET AND CBC If you have labs (blood work) drawn today and your tests are completely normal, you will receive your results only by: Marland Kitchen MyChart Message (if you have MyChart) OR . A paper copy in the mail If you have any lab test that is abnormal or we need to change your treatment, we will call you to review the results.   Testing/Procedures: Your physician has requested that you have a TEE/Cardioversion. During a TEE, sound waves are used to create images of your heart. It provides your doctor with information about the size and shape of your heart and how well your heart's chambers and valves are working. In this test, a transducer is attached to the end of a flexible tube that is guided down you throat and into your esophagus (the tube leading from your mouth to your stomach) to get a more detailed image of your heart. Once the TEE has determined that a blood clot is not present, the cardioversion begins. Electrical Cardioversion uses a jolt of electricity to your heart either through paddles or wired patches attached to your chest. This is a controlled, usually prescheduled, procedure. This procedure is done at the hospital and you are not awake during the procedure. You usually go home the day of the procedure. Please see the instruction sheet given to you today for more information.    Follow-Up: At [facility], you and your health needs are our priority.  As part of our continuing mission to provide you with exceptional heart care, we have created designated Provider Care Teams.  These Care Teams include your primary Cardiologist (physician) and Advanced Practice Providers (APPs -  Physician Assistants and Nurse Practitioners) who all work together to provide you with the care you need, when you need it.  We recommend signing up  for the patient portal called "MyChart".  Sign up information is provided on this After Visit Summary.  MyChart is used to connect with patients for Virtual Visits (Telemedicine).  Patients are able to view lab/test results, encounter notes, upcoming appointments, etc.  Non-urgent messages can be sent to your provider as well.   To learn more about what you can do with MyChart, go to [email or web address].    Your next appointment:   1 month(s)  The format for your next appointment:   In Person  Provider:   Eleonore Chiquito, MD   Other Instructions NONE

## 2020-01-27 ENCOUNTER — Other Ambulatory Visit (HOSPITAL_COMMUNITY): Payer: Medicare Other

## 2020-01-27 LAB — BASIC METABOLIC PANEL
BUN/Creatinine Ratio: 11 — ABNORMAL LOW (ref 12–28)
BUN: 8 mg/dL (ref 8–27)
CO2: 27 mmol/L (ref 20–29)
Calcium: 9.7 mg/dL (ref 8.7–10.3)
Chloride: 94 mmol/L — ABNORMAL LOW (ref 96–106)
Creatinine, Ser: 0.74 mg/dL (ref 0.57–1.00)
GFR calc Af Amer: 97 mL/min/{1.73_m2} (ref >59)
GFR calc non Af Amer: 84 mL/min/{1.73_m2} (ref >59)
Glucose: 102 mg/dL — ABNORMAL HIGH (ref 65–99)
Potassium: 4.3 mmol/L (ref 3.5–5.2)
Sodium: 136 mmol/L (ref 134–144)

## 2020-01-27 LAB — CBC
Hematocrit: 46 % (ref 34.0–46.6)
Hemoglobin: 16 g/dL — ABNORMAL HIGH (ref 11.1–15.9)
MCH: 33.8 pg — ABNORMAL HIGH (ref 26.6–33.0)
MCHC: 34.8 g/dL (ref 31.5–35.7)
MCV: 97 fL (ref 79–97)
Platelets: 246 10*3/uL (ref 150–450)
RBC: 4.73 x10E6/uL (ref 3.77–5.28)
RDW: 11.7 % (ref 11.7–15.4)
WBC: 7.8 10*3/uL (ref 3.4–10.8)

## 2020-01-27 LAB — CYTOLOGY - NON PAP

## 2020-01-28 ENCOUNTER — Other Ambulatory Visit: Payer: Self-pay

## 2020-01-28 ENCOUNTER — Inpatient Hospital Stay (HOSPITAL_BASED_OUTPATIENT_CLINIC_OR_DEPARTMENT_OTHER): Payer: Medicare Other | Admitting: Oncology

## 2020-01-28 ENCOUNTER — Other Ambulatory Visit (HOSPITAL_COMMUNITY)
Admission: RE | Admit: 2020-01-28 | Discharge: 2020-01-28 | Disposition: A | Discharge status: Home / Self Care | Payer: Medicare Other | Source: Ambulatory Visit | Attending: Cardiology | Admitting: Cardiology

## 2020-01-28 ENCOUNTER — Telehealth: Payer: Self-pay | Admitting: Oncology

## 2020-01-28 VITALS — BP 114/87 | HR 139 | Temp 98.7°F | Resp 18 | Ht 67.0 in | Wt 137.6 lb

## 2020-01-28 DIAGNOSIS — R630 Anorexia: Secondary | ICD-10-CM | POA: Diagnosis not present

## 2020-01-28 DIAGNOSIS — C251 Malignant neoplasm of body of pancreas: Secondary | ICD-10-CM | POA: Diagnosis present

## 2020-01-28 DIAGNOSIS — C259 Malignant neoplasm of pancreas, unspecified: Secondary | ICD-10-CM | POA: Insufficient documentation

## 2020-01-28 DIAGNOSIS — Z01812 Encounter for preprocedural laboratory examination: Secondary | ICD-10-CM | POA: Diagnosis present

## 2020-01-28 DIAGNOSIS — Z5111 Encounter for antineoplastic chemotherapy: Secondary | ICD-10-CM | POA: Diagnosis not present

## 2020-01-28 DIAGNOSIS — R634 Abnormal weight loss: Secondary | ICD-10-CM | POA: Diagnosis not present

## 2020-01-28 DIAGNOSIS — G893 Neoplasm related pain (acute) (chronic): Secondary | ICD-10-CM | POA: Diagnosis not present

## 2020-01-28 DIAGNOSIS — Z20822 Contact with and (suspected) exposure to covid-19: Secondary | ICD-10-CM | POA: Diagnosis not present

## 2020-01-28 DIAGNOSIS — R918 Other nonspecific abnormal finding of lung field: Secondary | ICD-10-CM | POA: Diagnosis not present

## 2020-01-28 DIAGNOSIS — J449 Chronic obstructive pulmonary disease, unspecified: Secondary | ICD-10-CM | POA: Diagnosis not present

## 2020-01-28 DIAGNOSIS — I4892 Unspecified atrial flutter: Secondary | ICD-10-CM | POA: Diagnosis not present

## 2020-01-28 LAB — SARS CORONAVIRUS 2 (TAT 6-24 HRS): SARS Coronavirus 2: NEGATIVE

## 2020-01-28 MED ORDER — ONDANSETRON HCL 8 MG PO TABS: 8.0000 mg | ORAL_TABLET | Freq: Three times a day (TID) | ORAL | 1 refills | Status: AC | PRN

## 2020-01-28 MED ORDER — PROCHLORPERAZINE MALEATE 10 MG PO TABS: 10.0000 mg | ORAL_TABLET | Freq: Four times a day (QID) | ORAL | 1 refills | Status: AC | PRN

## 2020-01-28 MED ORDER — LIDOCAINE-PRILOCAINE 2.5-2.5 % EX CREA: 1.0000 "application " | TOPICAL_CREAM | CUTANEOUS | 2 refills | Status: AC

## 2020-01-28 NOTE — Telephone Encounter (Signed)
Scheduled per los. Gave avs and calendar. No availability to move nutrition to same day as chemo class or first chemo. Patient ok to keep that appt on 11/18

## 2020-01-28 NOTE — Progress Notes (Signed)
START ON PATHWAY REGIMEN - Pancreatic Adenocarcinoma     A cycle is every 28 days:     Nab-paclitaxel (protein bound)      Gemcitabine   **Always confirm dose/schedule in your pharmacy ordering system**  Patient Characteristics: Preoperative (Clinical Staging), Borderline Resectable, PS = 0,1, BRCA1/2 and PALB2 Mutation Absent/Unknown Therapeutic Status: Preoperative (Clinical Staging) AJCC T Category: Staged < 8th Ed. AJCC N Category: Staged < 8th Ed. Resectability Status: Borderline Resectable AJCC M Category: Staged < 8th Ed. AJCC 8 Stage Grouping: Staged < 8th Ed. ECOG Performance Status: 1 BRCA1/2 Mutation Status: Awaiting Test Results PALB2 Mutation Status: Awaiting Test Results Intent of Therapy: Curative Intent, Discussed with Patient

## 2020-01-28 NOTE — Progress Notes (Signed)
[facility] OFFICE PROGRESS NOTE   Diagnosis: Pancreas cancer  INTERVAL HISTORY:    Rebecca Weaver returns as scheduled.  She reports improvement in the abdominal pain with oxycodone.  No new complaint.  She underwent Port-A-Cath placement on 01/20/2020.  She is scheduled for a cardioversion procedure tomorrow.    Vital signs in last 24 hours:  Blood pressure 114/87, pulse (!) 139, temperature 98.7 F (37.1 C), temperature source Tympanic, resp. rate 18, height 5\' 7"  (1.702 m), weight 137 lb 9.6 oz (62.4 kg), SpO2 97 %.    Resp: Distant breath sounds, no respiratory distress Cardio: Irregular, tachycardia GI: Tender in the mid upper abdomen, no mass, no hepatomegaly Vascular: No leg edema   Portacath/PICC-without erythema  Lab Results:  Lab Results  Component Value Date   WBC 7.8 01/26/2020   HGB 16.0 (H) 01/26/2020   HCT 46.0 01/26/2020   MCV 97 01/26/2020   PLT 246 01/26/2020   NEUTROABS 6.8 01/14/2020    CMP  Lab Results  Component Value Date   NA 136 01/26/2020   K 4.3 01/26/2020   CL 94 (L) 01/26/2020   CO2 27 01/26/2020   GLUCOSE 102 (H) 01/26/2020   BUN 8 01/26/2020   CREATININE 0.74 01/26/2020   CALCIUM 9.7 01/26/2020   PROT 6.1 (L) 12/30/2019   ALBUMIN 3.3 (L) 12/31/2019   AST 16 12/30/2019   ALT 17 12/30/2019   ALKPHOS 48 12/30/2019   BILITOT 1.0 12/30/2019   GFRNONAA 84 01/26/2020   GFRAA 97 01/26/2020      Imaging: 01/21/2020 PET images reviewed today Medications: I have reviewed the patient's current medications.   Assessment/Plan: 1. Pancreas cancer, adenocarcinoma the pancreas body, status post a CT-guided biopsy 01/09/2020  CTs 12/29/2019-pancreas body mass adjacent to the SMA, 11 mm right upper lobe nodule and 6 mm left upper lobe nodule as well as other tiny lung nodules new compared to a CT from 11/21/2017.  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   Abdomen/back pain secondary to #1 3.   Anorexia/weight loss secondary to #1 4.   Atrial flutter 5.   COPD 6.   Port-A-Cath placement 01/20/2020   Disposition:  Ms.Rebecca Weaver has been diagnosed with pancreas cancer.  I reviewed the staging PET images with her.  There is a mildly hypermetabolic right upper lobe nodule.  The nodule could represent a primary lung tumor or a metastasis.  Her case was presented at the GI tumor conference earlier today.  The conference recommendation is to proceed with systemic chemotherapy for 2-3 months and then perform a restaging CT evaluation.  We will consider referring her for surgery if the restaging CTs show no evidence of metastatic disease and the tumor appears potentially resectable.  She may not be a surgical candidate secondary to COPD.  I discussed gemcitabine/Abraxane and FOLFIRINOX therapies with her.  I think it would be difficult for her to tolerate FOLFIRINOX.  I recommend gemcitabine/Abraxane.  We discussed the expected response rate with this regimen.  She agrees to proceed.  We reviewed potential toxicities associated with this chemotherapy regimen including the chance for nausea, alopecia, and hematologic toxicity.  We discussed the rash, pneumonitis, and fever associated with gemcitabine.  We discussed the neuropathy seen with Abraxane.  She will return for cycle 1 chemotherapy on 02/06/2020.  The  plan is to deliver gemcitabine/Abraxane on a 2-week schedule.  She will be scheduled for an office visit in the next cycle of chemotherapy on 02/20/2020.  She will continue oxycodone as needed for pain.  We referred her to the cancer center nutritionist.  Betsy Coder, MD  01/28/2020  1:48 PM

## 2020-01-29 ENCOUNTER — Encounter (HOSPITAL_COMMUNITY): Payer: Self-pay | Admitting: Cardiology

## 2020-01-29 ENCOUNTER — Inpatient Hospital Stay: Payer: Medicare Other | Admitting: Nutrition

## 2020-01-29 ENCOUNTER — Ambulatory Visit (HOSPITAL_COMMUNITY): Payer: Medicare Other | Admitting: Certified Registered"

## 2020-01-29 ENCOUNTER — Encounter: Payer: Self-pay | Admitting: Nutrition

## 2020-01-29 ENCOUNTER — Encounter (HOSPITAL_COMMUNITY)
Admission: RE | Disposition: A | Discharge status: Home / Self Care | Payer: Self-pay | Source: Home / Self Care | Attending: Cardiology

## 2020-01-29 ENCOUNTER — Ambulatory Visit (HOSPITAL_COMMUNITY)
Admission: RE | Admit: 2020-01-29 | Discharge: 2020-01-29 | Disposition: A | Discharge status: Home / Self Care | Payer: Medicare Other | Attending: Cardiology | Admitting: Cardiology

## 2020-01-29 ENCOUNTER — Ambulatory Visit (HOSPITAL_BASED_OUTPATIENT_CLINIC_OR_DEPARTMENT_OTHER)
Admission: RE | Admit: 2020-01-29 | Discharge: 2020-01-29 | Disposition: A | Discharge status: Home / Self Care | Payer: Medicare Other | Source: Ambulatory Visit | Attending: Cardiovascular Disease | Admitting: Cardiovascular Disease

## 2020-01-29 ENCOUNTER — Other Ambulatory Visit: Payer: Self-pay

## 2020-01-29 DIAGNOSIS — C259 Malignant neoplasm of pancreas, unspecified: Secondary | ICD-10-CM | POA: Insufficient documentation

## 2020-01-29 DIAGNOSIS — I340 Nonrheumatic mitral (valve) insufficiency: Secondary | ICD-10-CM | POA: Diagnosis not present

## 2020-01-29 DIAGNOSIS — Z7901 Long term (current) use of anticoagulants: Secondary | ICD-10-CM | POA: Diagnosis not present

## 2020-01-29 DIAGNOSIS — Z881 Allergy status to other antibiotic agents status: Secondary | ICD-10-CM | POA: Diagnosis not present

## 2020-01-29 DIAGNOSIS — F17210 Nicotine dependence, cigarettes, uncomplicated: Secondary | ICD-10-CM | POA: Diagnosis not present

## 2020-01-29 DIAGNOSIS — J449 Chronic obstructive pulmonary disease, unspecified: Secondary | ICD-10-CM | POA: Insufficient documentation

## 2020-01-29 DIAGNOSIS — I10 Essential (primary) hypertension: Secondary | ICD-10-CM | POA: Diagnosis not present

## 2020-01-29 DIAGNOSIS — I4892 Unspecified atrial flutter: Secondary | ICD-10-CM

## 2020-01-29 DIAGNOSIS — Z79899 Other long term (current) drug therapy: Secondary | ICD-10-CM | POA: Insufficient documentation

## 2020-01-29 DIAGNOSIS — Z882 Allergy status to sulfonamides status: Secondary | ICD-10-CM | POA: Diagnosis not present

## 2020-01-29 HISTORY — PX: CARDIOVERSION: SHX1299

## 2020-01-29 HISTORY — PX: TEE WITHOUT CARDIOVERSION: SHX5443

## 2020-01-29 SURGERY — ECHOCARDIOGRAM, TRANSESOPHAGEAL
Anesthesia: General

## 2020-01-29 MED ORDER — LACTATED RINGERS IV SOLN: INTRAVENOUS | Status: DC | PRN

## 2020-01-29 MED ORDER — LIDOCAINE 2% (20 MG/ML) 5 ML SYRINGE
INTRAMUSCULAR | Status: DC | PRN
  Administered 2020-01-29: 40 mg via INTRAVENOUS

## 2020-01-29 MED ORDER — PROPOFOL 10 MG/ML IV BOLUS
INTRAVENOUS | Status: DC | PRN
  Administered 2020-01-29: 30 mg via INTRAVENOUS
  Administered 2020-01-29: 20 mg via INTRAVENOUS

## 2020-01-29 MED ORDER — SODIUM CHLORIDE 0.9 % IV SOLN: INTRAVENOUS | Status: DC

## 2020-01-29 MED ORDER — PROPOFOL 500 MG/50ML IV EMUL
INTRAVENOUS | Status: DC | PRN
  Administered 2020-01-29: 100 ug/kg/min via INTRAVENOUS

## 2020-01-29 NOTE — Anesthesia Preprocedure Evaluation (Addendum)
Anesthesia Evaluation  Patient identified by MRN, date of birth, ID band Patient awake    Reviewed: Allergy & Precautions, NPO status , Patient's Chart, lab work & pertinent test results  Airway Mallampati: II  TM Distance: >3 FB Neck ROM: Full    Dental  (+) Teeth Intact, Dental Advisory Given   Pulmonary COPD, Current Smoker,    breath sounds clear to auscultation       Cardiovascular hypertension, Pt. on medications + CAD  + dysrhythmias Atrial Fibrillation  Rhythm:Irregular Rate:Normal  12/30/19 Echo 1. Left ventricular ejection fraction, by estimation, is 50 to 55%. The  left ventricle has low normal function. The left ventricle has no regional  wall motion abnormalities. Left ventricular diastolic parameters are  indeterminate.  2. Right ventricular systolic function is low normal. The right  ventricular size is mildly enlarged. Mildly increased right ventricular  wall thickness.  3. Left atrial size was severely dilated.    Neuro/Psych negative neurological ROS  negative psych ROS   GI/Hepatic GERD  Medicated and Controlled,Newly Dx pancreatic CA   Endo/Other  negative endocrine ROS  Renal/GU K+ 4.3 Cr 0.74     Musculoskeletal   Abdominal   Peds  Hematology negative hematology ROS (+) Hgb 16.0   Anesthesia Other Findings   Reproductive/Obstetrics                            Anesthesia Physical Anesthesia Plan  ASA: III  Anesthesia Plan: MAC   Post-op Pain Management:    Induction:   PONV Risk Score and Plan: Treatment may vary due to age or medical condition  Airway Management Planned: Nasal Cannula and Natural Airway  Additional Equipment: None  Intra-op Plan:   Post-operative Plan: Extubation in OR  Informed Consent: I have reviewed the patients History and Physical, chart, labs and discussed the procedure including the risks, benefits and alternatives for the  proposed anesthesia with the patient or authorized representative who has indicated his/her understanding and acceptance.     Dental advisory given  Plan Discussed with: Anesthesiologist and CRNA  Anesthesia Plan Comments:        Anesthesia Quick Evaluation

## 2020-01-29 NOTE — Transfer of Care (Signed)
Immediate Anesthesia Transfer of Care Note  Patient: Rebecca Weaver  Procedure(s) Performed: TRANSESOPHAGEAL ECHOCARDIOGRAM (TEE) (N/A ) CARDIOVERSION (N/A )  Patient Location: Endoscopy Unit  Anesthesia Type:General  Level of Consciousness: awake, alert , oriented and drowsy  Airway & Oxygen Therapy: Patient Spontanous Breathing  Post-op Assessment: Report given to RN, Post -op Vital signs reviewed and stable and Patient moving all extremities  Post vital signs: Reviewed and stable  Last Vitals:  Vitals Value Taken Time  BP 96/40   Temp    Pulse 66 01/29/20 1356  Resp 13 01/29/20 1356  SpO2 97 % 01/29/20 1356  Vitals shown include unvalidated device data.  Last Pain:  Vitals:   01/29/20 1245  TempSrc: Temporal  PainSc: 0-No pain         Complications: No complications documented.

## 2020-01-29 NOTE — CV Procedure (Signed)
   TRANSESOPHAGEAL ECHOCARDIOGRAM GUIDED DIRECT CURRENT CARDIOVERSION  NAME:  Rebecca Weaver   MRN: [id number] DOB:  03-Aug-1950   ADMIT DATE: 01/29/2020  INDICATIONS: Symptomatic atrial flutter  PROCEDURE:   Informed consent was obtained prior to the procedure. The risks, benefits and alternatives for the procedure were discussed and the patient comprehended these risks.  Risks include, but are not limited to, cough, sore throat, vomiting, nausea, somnolence, esophageal and stomach trauma or perforation, bleeding, low blood pressure, aspiration, pneumonia, infection, trauma to the teeth and death.    After a procedural time-out, the oropharynx was anesthetized and the patient was sedated by the anesthesia service. The transesophageal probe was inserted in the esophagus and stomach without difficulty and multiple views were obtained. Anesthesia was monitored by Dr. Linna Caprice. Patient received 40 mg IV lidocaine and 96 mg IV propofol.  COMPLICATIONS:    Complications: No complications Patient tolerated procedure well.  FINDINGS:  LEFT VENTRICLE: EF = 45-50%. No regional wall motion abnormalities, but there is mild global hypokinesis.  RIGHT VENTRICLE: Normal size, mildly reduced function.   LEFT ATRIUM: No thrombus/mass. 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   RIGHT ATRIUM: No thrombus/mass. 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 No regurgitation. No vegetation.  MITRAL VALVE:    Appears to have mild prolapse, will be described more fully in final report. Mild regurgitation. No vegetation.  TRICUSPID VALVE: Normal structure. Trivial regurgitation. No vegetation.  PULMONIC VALVE: Grossly normal structure. No regurgitation. No apparent vegetation.  INTERATRIAL SEPTUM: No PFO or ASD seen by color Doppler. There is severe lipomatous hypertrophy of the intra-atrial  septum  PERICARDIUM: Trivial circumferential effusion noted.  DESCENDING AORTA: Mild diffuse plaque seen   CARDIOVERSION:     Indications:  Symptomatic Atrial Flutter  Procedure Details:  The patient had the defibrillator pads placed in the anterior and posterior position. Once an appropriate level of sedation was confirmed, the patient was cardioverted x 1 with 120J of biphasic synchronized energy.  The patient converted to NSR.  There were no apparent complications.  The patient had normal neuro status and respiratory status post procedure with vitals stable as recorded elsewhere.  Adequate airway was maintained throughout and vital signs monitored per protocol.  Buford Dresser, MD, PhD [facility]  [address], [address] [place], [place] [address] [phone]   1:52 PM

## 2020-01-29 NOTE — Discharge Instructions (Signed)
Electrical Cardioversion Electrical cardioversion is the delivery of a jolt of electricity to restore a normal rhythm to the heart. A rhythm that is too fast or is not regular keeps the heart from pumping well. In this procedure, sticky patches or metal paddles are placed on the chest to deliver electricity to the heart from a device. This procedure may be done in an emergency if:  There is low or no blood pressure as a result of the heart rhythm.  Normal rhythm must be restored as fast as possible to protect the brain and heart from further damage.  It may save a life. This may also be a scheduled procedure for irregular or fast heart rhythms that are not immediately life-threatening. Follow these instructions at home:  Do not drive for 24 hours if you were given a sedative during your procedure.  Take over-the-counter and prescription medicines only as told by your health care provider.  Ask your health care provider how to check your pulse. Check it often.  Rest for 48 hours after the procedure or as told by your health care provider.  Avoid or limit your caffeine use as told by your health care provider.  Keep all follow-up visits as told by your health care provider. This is important. Contact a health care provider if:  You feel like your heart is beating too quickly or your pulse is not regular.  You have a serious muscle cramp that does not go away. Get help right away if:  You have discomfort in your chest.  You are dizzy or you feel faint.  You have trouble breathing or you are short of breath.  Your speech is slurred.  You have trouble moving an arm or leg on one side of your body.  Your fingers or toes turn cold or blue. Summary  Electrical cardioversion is the delivery of a jolt of electricity to restore a normal rhythm to the heart.  This procedure may be done right away in an emergency or may be a scheduled procedure if the condition is not an  emergency.  Generally, this is a safe procedure.  After the procedure, check your pulse often as told by your health care provider. This information is not intended to replace advice given to you by your health care provider. Make sure you discuss any questions you have with your health care provider. Document Revised: 09/30/2018 Document Reviewed: 09/30/2018 Elsevier Patient Education  [place].   TEE  YOU HAD AN CARDIAC PROCEDURE TODAY: Refer to the procedure report and other information in the discharge instructions given to you for any specific questions about what was found during the examination. If this information does not answer your questions, please call your cardiologist to clarify.   DIET: Your first meal following the procedure should be a light meal and then it is ok to progress to your normal diet. A half-sandwich or bowl of soup is an example of a good first meal. Heavy or fried foods are harder to digest and may make you feel nauseous or bloated. Drink plenty of fluids but you should avoid alcoholic beverages for 24 hours. If you had a esophageal dilation, please see attached instructions for diet.   ACTIVITY: Your care partner should take you home directly after the procedure. You should plan to take it easy, moving slowly for the rest of the day. You can resume normal activity the day after the procedure however YOU SHOULD NOT DRIVE, use power tools, machinery  or perform tasks that involve climbing or major physical exertion for 24 hours (because of the sedation medicines used during the test).   SYMPTOMS TO REPORT IMMEDIATELY: A cardiologist can be reached at any hour. Please call you cardiologist  for any of the following symptoms:  Vomiting of blood or coffee ground material  New, significant abdominal pain  New, significant chest pain or pain under the shoulder blades  Painful or persistently difficult swallowing  New shortness of breath  Black, tarry-looking  or red, bloody stools  FOLLOW UP:  Please also call with any specific questions about appointments or follow up tests.

## 2020-01-29 NOTE — Progress Notes (Signed)
Patient did not show up for nutrition appointment. 

## 2020-01-29 NOTE — Interval H&P Note (Signed)
History and Physical Interval Note:  01/29/2020 1:23 PM  Rebecca Weaver  has presented today for surgery, with the diagnosis of a flutter.  The various methods of treatment have been discussed with the patient and family. After consideration of risks, benefits and other options for treatment, the patient has consented to  Procedure(s): TRANSESOPHAGEAL ECHOCARDIOGRAM (TEE) (N/A) CARDIOVERSION (N/A) as a surgical intervention.  The patient's history has been reviewed, patient examined, no change in status, stable for surgery.  I have reviewed the patient's chart and labs.  Questions were answered to the patient's satisfaction.     Aelyn Stanaland Harrell Gave

## 2020-01-29 NOTE — Progress Notes (Signed)
Echocardiogram Echocardiogram Transesophageal has been performed.  Rebecca Weaver [name] 01/29/2020, 2:02 PM

## 2020-01-29 NOTE — Anesthesia Procedure Notes (Signed)
Procedure Name: MAC Performed by: Rande Brunt, CRNA Pre-anesthesia Checklist: Patient identified, Emergency Drugs available, Suction available, Patient being monitored and Timeout performed Oxygen Delivery Method: Nasal cannula Dental Injury: Teeth and Oropharynx as per pre-operative assessment

## 2020-01-30 NOTE — Progress Notes (Signed)
Pharmacist Chemotherapy Monitoring - Initial Assessment    Anticipated start date: 02/06/20   Regimen:  . Are orders appropriate based on the patient's diagnosis, regimen, and cycle? Yes . Does the plan date match the patient's scheduled date? Yes . Is the sequencing of drugs appropriate? Yes . Are the premedications appropriate for the patient's regimen? Yes . Prior Authorization for treatment is: Approved o If applicable, is the correct biosimilar selected based on the patient's insurance? not applicable  Organ Function and Labs: Marland Kitchen Are dose adjustments needed based on the patient's renal function, hepatic function, or hematologic function? No . Are appropriate labs ordered prior to the start of patient's treatment? Yes . Other organ system assessment, if indicated: N/A . The following baseline labs, if indicated, have been ordered: N/A  Dose Assessment: . Are the drug doses appropriate? Yes . Are the following correct: o Drug concentrations Yes o IV fluid compatible with drug Yes o Administration routes Yes o Timing of therapy Yes . If applicable, does the patient have documented access for treatment and/or plans for port-a-cath placement? yes . If applicable, have lifetime cumulative doses been properly documented and assessed? not applicable Lifetime Dose Tracking  No doses have been documented on this patient for the following tracked chemicals: Doxorubicin, Epirubicin, Idarubicin, Daunorubicin, Mitoxantrone, Bleomycin, Oxaliplatin, Carboplatin, Liposomal Doxorubicin  o   Toxicity Monitoring/Prevention: . The patient has the following take home antiemetics prescribed: Ondansetron and Prochlorperazine . The patient has the following take home medications prescribed: N/A . Medication allergies and previous infusion related reactions, if applicable, have been reviewed and addressed. Yes . The patient's current medication list has been assessed for drug-drug interactions with  their chemotherapy regimen. no significant drug-drug interactions were identified on review.  Order Review: . Are the treatment plan orders signed? No . Is the patient scheduled to see a provider prior to their treatment? No  I verify that I have reviewed each item in the above checklist and answered each question accordingly.   Kennith Center, Pharm.D., CPP 01/30/2020@4 :31 PM

## 2020-01-30 NOTE — Anesthesia Postprocedure Evaluation (Signed)
Anesthesia Post Note  Patient: Rebecca Weaver  Procedure(s) Performed: TRANSESOPHAGEAL ECHOCARDIOGRAM (TEE) (N/A ) CARDIOVERSION (N/A )     Patient location during evaluation: Endoscopy Anesthesia Type: General Level of consciousness: awake and alert Pain management: pain level controlled Vital Signs Assessment: post-procedure vital signs reviewed and stable Respiratory status: spontaneous breathing, nonlabored ventilation, respiratory function stable and patient connected to nasal cannula oxygen Cardiovascular status: stable and blood pressure returned to baseline Postop Assessment: no apparent nausea or vomiting Anesthetic complications: no   No complications documented.  Last Vitals:  Vitals:   01/29/20 1416 01/29/20 1426  BP: (!) 97/58 (!) 101/58  Pulse: 62 72  Resp: 12 12  Temp:    SpO2: 91% 91%    Last Pain:  Vitals:   01/29/20 1426  TempSrc:   PainSc: 0-No pain                 [place] COKER

## 2020-02-01 ENCOUNTER — Other Ambulatory Visit: Payer: Self-pay | Admitting: Oncology

## 2020-02-03 ENCOUNTER — Inpatient Hospital Stay: Payer: Medicare Other

## 2020-02-03 ENCOUNTER — Other Ambulatory Visit: Payer: Self-pay

## 2020-02-03 DIAGNOSIS — Z5111 Encounter for antineoplastic chemotherapy: Secondary | ICD-10-CM | POA: Diagnosis not present

## 2020-02-03 DIAGNOSIS — C259 Malignant neoplasm of pancreas, unspecified: Secondary | ICD-10-CM

## 2020-02-03 DIAGNOSIS — Z95828 Presence of other vascular implants and grafts: Secondary | ICD-10-CM

## 2020-02-03 LAB — CMP (CANCER CENTER ONLY)
ALT: 14 U/L (ref 0–44)
AST: 23 U/L (ref 15–41)
Albumin: 3.9 g/dL (ref 3.5–5.0)
Alkaline Phosphatase: 68 U/L (ref 38–126)
Anion gap: 16 — ABNORMAL HIGH (ref 5–15)
BUN: 8 mg/dL (ref 8–23)
CO2: 23 mmol/L (ref 22–32)
Calcium: 9.1 mg/dL (ref 8.9–10.3)
Chloride: 98 mmol/L (ref 98–111)
Creatinine: 0.76 mg/dL (ref 0.44–1.00)
GFR, Estimated: >60 mL/min (ref >60)
Glucose, Bld: 104 mg/dL — ABNORMAL HIGH (ref 70–99)
Potassium: 4 mmol/L (ref 3.5–5.1)
Sodium: 137 mmol/L (ref 135–145)
Total Bilirubin: 0.6 mg/dL (ref 0.3–1.2)
Total Protein: 7 g/dL (ref 6.5–8.1)

## 2020-02-03 MED ORDER — SODIUM CHLORIDE 0.9% FLUSH
10.0000 mL | Freq: Once | INTRAVENOUS | Status: AC
  Administered 2020-02-03: 10 mL
  Filled 2020-02-03: qty 10

## 2020-02-03 MED ORDER — HEPARIN SOD (PORK) LOCK FLUSH 100 UNIT/ML IV SOLN
500.0000 [IU] | Freq: Once | INTRAVENOUS | Status: AC
  Administered 2020-02-03: 500 [IU]
  Filled 2020-02-03: qty 5

## 2020-02-06 ENCOUNTER — Inpatient Hospital Stay: Payer: Medicare Other

## 2020-02-06 ENCOUNTER — Other Ambulatory Visit: Payer: Self-pay

## 2020-02-06 VITALS — BP 93/58 | HR 72 | Temp 98.5°F | Resp 16 | Ht 67.0 in | Wt 133.0 lb

## 2020-02-06 DIAGNOSIS — Z5111 Encounter for antineoplastic chemotherapy: Secondary | ICD-10-CM | POA: Diagnosis not present

## 2020-02-06 DIAGNOSIS — Z95828 Presence of other vascular implants and grafts: Secondary | ICD-10-CM

## 2020-02-06 DIAGNOSIS — C259 Malignant neoplasm of pancreas, unspecified: Secondary | ICD-10-CM

## 2020-02-06 LAB — CBC WITH DIFFERENTIAL (CANCER CENTER ONLY)
Abs Immature Granulocytes: 0.02 10*3/uL (ref 0.00–0.07)
Basophils Absolute: 0.1 10*3/uL (ref 0.0–0.1)
Basophils Relative: 1 %
Eosinophils Absolute: 0.1 10*3/uL (ref 0.0–0.5)
Eosinophils Relative: 2 %
HCT: 44.3 % (ref 36.0–46.0)
Hemoglobin: 15.1 g/dL — ABNORMAL HIGH (ref 12.0–15.0)
Immature Granulocytes: 0 %
Lymphocytes Relative: 23 %
Lymphs Abs: 1.5 10*3/uL (ref 0.7–4.0)
MCH: 33.1 pg (ref 26.0–34.0)
MCHC: 34.1 g/dL (ref 30.0–36.0)
MCV: 97.1 fL (ref 80.0–100.0)
Monocytes Absolute: 0.8 10*3/uL (ref 0.1–1.0)
Monocytes Relative: 13 %
Neutro Abs: 3.9 10*3/uL (ref 1.7–7.7)
Neutrophils Relative %: 61 %
Platelet Count: 167 10*3/uL (ref 150–400)
RBC: 4.56 MIL/uL (ref 3.87–5.11)
RDW: 12.3 % (ref 11.5–15.5)
WBC Count: 6.4 10*3/uL (ref 4.0–10.5)
nRBC: 0 % (ref 0.0–0.2)

## 2020-02-06 MED ORDER — PROCHLORPERAZINE MALEATE 10 MG PO TABS
10.0000 mg | ORAL_TABLET | Freq: Once | ORAL | Status: AC
  Administered 2020-02-06: 10 mg via ORAL

## 2020-02-06 MED ORDER — SODIUM CHLORIDE 0.9 % IV SOLN
1000.0000 mg/m2 | Freq: Once | INTRAVENOUS | Status: AC
  Administered 2020-02-06: 1710 mg via INTRAVENOUS
  Filled 2020-02-06: qty 44.97

## 2020-02-06 MED ORDER — PROCHLORPERAZINE MALEATE 10 MG PO TABS
ORAL_TABLET | ORAL | Status: AC
  Filled 2020-02-06: qty 1

## 2020-02-06 MED ORDER — SODIUM CHLORIDE 0.9% FLUSH
10.0000 mL | INTRAVENOUS | Status: DC | PRN
  Administered 2020-02-06 (×2): 10 mL
  Filled 2020-02-06: qty 10

## 2020-02-06 MED ORDER — SODIUM CHLORIDE 0.9 % IV SOLN
Freq: Once | INTRAVENOUS | Status: AC
  Filled 2020-02-06: qty 250

## 2020-02-06 MED ORDER — PACLITAXEL PROTEIN-BOUND CHEMO INJECTION 100 MG
120.0000 mg/m2 | Freq: Once | INTRAVENOUS | Status: AC
  Administered 2020-02-06: 200 mg via INTRAVENOUS
  Filled 2020-02-06: qty 40

## 2020-02-06 MED ORDER — HEPARIN SOD (PORK) LOCK FLUSH 100 UNIT/ML IV SOLN
500.0000 [IU] | Freq: Once | INTRAVENOUS | Status: AC | PRN
  Administered 2020-02-06: 500 [IU]
  Filled 2020-02-06: qty 5

## 2020-02-06 NOTE — Progress Notes (Signed)
Per Dr. Alen Blew, okay for patient to proceed with treatment today before waiting for CBC to result.

## 2020-02-06 NOTE — Patient Instructions (Signed)
Nanoparticle Albumin-Bound Paclitaxel injection What is this medicine? NANOPARTICLE ALBUMIN-BOUND PACLITAXEL (Na no PAHR ti kuhl al BYOO muhn-bound PAK li TAX el) is a chemotherapy drug. It targets fast dividing cells, like cancer cells, and causes these cells to die. This medicine is used to treat advanced breast cancer, lung cancer, and pancreatic cancer. This medicine may be used for other purposes; ask your health care provider or pharmacist if you have questions. COMMON BRAND NAME(S): Abraxane What should I tell my health care provider before I take this medicine? They need to know if you have any of these conditions:  kidney disease  liver disease  low blood counts, like low white cell, platelet, or red cell counts  lung or breathing disease, like asthma  tingling of the fingers or toes, or other nerve disorder  an unusual or allergic reaction to paclitaxel, albumin, other chemotherapy, other medicines, foods, dyes, or preservatives  pregnant or trying to get pregnant  breast-feeding How should I use this medicine? This drug is given as an infusion into a vein. It is administered in a hospital or clinic by a specially trained health care professional. Talk to your pediatrician regarding the use of this medicine in children. Special care may be needed. Overdosage: If you think you have taken too much of this medicine contact a poison control center or emergency room at once. NOTE: This medicine is only for you. Do not share this medicine with others. What if I miss a dose? It is important not to miss your dose. Call your doctor or health care professional if you are unable to keep an appointment. What may interact with this medicine? This medicine may interact with the following medications:  antiviral medicines for hepatitis, HIV or AIDS  certain antibiotics like erythromycin and clarithromycin  certain medicines for fungal infections like ketoconazole and  itraconazole  certain medicines for seizures like carbamazepine, phenobarbital, phenytoin  gemfibrozil  nefazodone  rifampin  St. John's wort This list may not describe all possible interactions. Give your health care provider a list of all the medicines, herbs, non-prescription drugs, or dietary supplements you use. Also tell them if you smoke, drink alcohol, or use illegal drugs. Some items may interact with your medicine. What should I watch for while using this medicine? Your condition will be monitored carefully while you are receiving this medicine. You will need important blood work done while you are taking this medicine. This medicine can cause serious allergic reactions. If you experience allergic reactions like skin rash, itching or hives, swelling of the face, lips, or tongue, tell your doctor or health care professional right away. In some cases, you may be given additional medicines to help with side effects. Follow all directions for their use. This drug may make you feel generally unwell. This is not uncommon, as chemotherapy can affect healthy cells as well as cancer cells. Report any side effects. Continue your course of treatment even though you feel ill unless your doctor tells you to stop. Call your doctor or health care professional for advice if you get a fever, chills or sore throat, or other symptoms of a cold or flu. Do not treat yourself. This drug decreases your body's ability to fight infections. Try to avoid being around people who are sick. This medicine may increase your risk to bruise or bleed. Call your doctor or health care professional if you notice any unusual bleeding. Be careful brushing and flossing your teeth or using a toothpick because you may   get an infection or bleed more easily. If you have any dental work done, tell your dentist you are receiving this medicine. Avoid taking products that contain aspirin, acetaminophen, ibuprofen, naproxen, or  ketoprofen unless instructed by your doctor. These medicines may hide a fever. Do not become pregnant while taking this medicine or for 6 months after stopping it. Women should inform their doctor if they wish to become pregnant or think they might be pregnant. Men should not father a child while taking this medicine or for 3 months after stopping it. There is a potential for serious side effects to an unborn child. Talk to your health care professional or pharmacist for more information. Do not breast-feed an infant while taking this medicine or for 2 weeks after stopping it. This medicine may interfere with the ability to get pregnant or to father a child. You should talk to your doctor or health care professional if you are concerned about your fertility. What side effects may I notice from receiving this medicine? Side effects that you should report to your doctor or health care professional as soon as possible:  allergic reactions like skin rash, itching or hives, swelling of the face, lips, or tongue  breathing problems  changes in vision  fast, irregular heartbeat  low blood pressure  mouth sores  pain, tingling, numbness in the hands or feet  signs of decreased platelets or bleeding - bruising, pinpoint red spots on the skin, black, tarry stools, blood in the urine  signs of decreased red blood cells - unusually weak or tired, feeling faint or lightheaded, falls  signs of infection - fever or chills, cough, sore throat, pain or difficulty passing urine  signs and symptoms of liver injury like dark yellow or brown urine; general ill feeling or flu-like symptoms; light-colored stools; loss of appetite; nausea; right upper belly pain; unusually weak or tired; yellowing of the eyes or skin  swelling of the ankles, feet, hands  unusually slow heartbeat Side effects that usually do not require medical attention (report to your doctor or health care professional if they continue or  are bothersome):  diarrhea  hair loss  loss of appetite  nausea, vomiting  tiredness This list may not describe all possible side effects. Call your doctor for medical advice about side effects. You may report side effects to FDA at 1-800-FDA-1088. Where should I keep my medicine? This drug is given in a hospital or clinic and will not be stored at home. NOTE: This sheet is a summary. It may not cover all possible information. If you have questions about this medicine, talk to your doctor, pharmacist, or health care provider.  2020 Elsevier/Gold Standard (2016-10-31 13:03:45)  Gemcitabine injection What is this medicine? GEMCITABINE (jem SYE ta been) is a chemotherapy drug. This medicine is used to treat many types of cancer like breast cancer, lung cancer, pancreatic cancer, and ovarian cancer. This medicine may be used for other purposes; ask your health care provider or pharmacist if you have questions. COMMON BRAND NAME(S): Gemzar, Infugem What should I tell my health care provider before I take this medicine? They need to know if you have any of these conditions:  blood disorders  infection  kidney disease  liver disease  lung or breathing disease, like asthma  recent or ongoing radiation therapy  an unusual or allergic reaction to gemcitabine, other chemotherapy, other medicines, foods, dyes, or preservatives  pregnant or trying to get pregnant  breast-feeding How should I use this   medicine? This drug is given as an infusion into a vein. It is administered in a hospital or clinic by a specially trained health care professional. Talk to your pediatrician regarding the use of this medicine in children. Special care may be needed. Overdosage: If you think you have taken too much of this medicine contact a poison control center or emergency room at once. NOTE: This medicine is only for you. Do not share this medicine with others. What if I miss a dose? It is  important not to miss your dose. Call your doctor or health care professional if you are unable to keep an appointment. What may interact with this medicine?  medicines to increase blood counts like filgrastim, pegfilgrastim, sargramostim  some other chemotherapy drugs like cisplatin  vaccines Talk to your doctor or health care professional before taking any of these medicines:  acetaminophen  aspirin  ibuprofen  ketoprofen  naproxen This list may not describe all possible interactions. Give your health care provider a list of all the medicines, herbs, non-prescription drugs, or dietary supplements you use. Also tell them if you smoke, drink alcohol, or use illegal drugs. Some items may interact with your medicine. What should I watch for while using this medicine? Visit your doctor for checks on your progress. This drug may make you feel generally unwell. This is not uncommon, as chemotherapy can affect healthy cells as well as cancer cells. Report any side effects. Continue your course of treatment even though you feel ill unless your doctor tells you to stop. In some cases, you may be given additional medicines to help with side effects. Follow all directions for their use. Call your doctor or health care professional for advice if you get a fever, chills or sore throat, or other symptoms of a cold or flu. Do not treat yourself. This drug decreases your body's ability to fight infections. Try to avoid being around people who are sick. This medicine may increase your risk to bruise or bleed. Call your doctor or health care professional if you notice any unusual bleeding. Be careful brushing and flossing your teeth or using a toothpick because you may get an infection or bleed more easily. If you have any dental work done, tell your dentist you are receiving this medicine. Avoid taking products that contain aspirin, acetaminophen, ibuprofen, naproxen, or ketoprofen unless instructed by  your doctor. These medicines may hide a fever. Do not become pregnant while taking this medicine or for 6 months after stopping it. Women should inform their doctor if they wish to become pregnant or think they might be pregnant. Men should not father a child while taking this medicine and for 3 months after stopping it. There is a potential for serious side effects to an unborn child. Talk to your health care professional or pharmacist for more information. Do not breast-feed an infant while taking this medicine or for at least 1 week after stopping it. Men should inform their doctors if they wish to father a child. This medicine may lower sperm counts. Talk with your doctor or health care professional if you are concerned about your fertility. What side effects may I notice from receiving this medicine? Side effects that you should report to your doctor or health care professional as soon as possible:  allergic reactions like skin rash, itching or hives, swelling of the face, lips, or tongue  breathing problems  pain, redness, or irritation at site where injected  signs and symptoms of a dangerous   change in heartbeat or heart rhythm like chest pain; dizziness; fast or irregular heartbeat; palpitations; feeling faint or lightheaded, falls; breathing problems  signs of decreased platelets or bleeding - bruising, pinpoint red spots on the skin, black, tarry stools, blood in the urine  signs of decreased red blood cells - unusually weak or tired, feeling faint or lightheaded, falls  signs of infection - fever or chills, cough, sore throat, pain or difficulty passing urine  signs and symptoms of kidney injury like trouble passing urine or change in the amount of urine  signs and symptoms of liver injury like dark yellow or brown urine; general ill feeling or flu-like symptoms; light-colored stools; loss of appetite; nausea; right upper belly pain; unusually weak or tired; yellowing of the eyes or  skin  swelling of ankles, feet, hands Side effects that usually do not require medical attention (report to your doctor or health care professional if they continue or are bothersome):  constipation  diarrhea  hair loss  loss of appetite  nausea  rash  vomiting This list may not describe all possible side effects. Call your doctor for medical advice about side effects. You may report side effects to FDA at 1-800-FDA-1088. Where should I keep my medicine? This drug is given in a hospital or clinic and will not be stored at home. NOTE: This sheet is a summary. It may not cover all possible information. If you have questions about this medicine, talk to your doctor, pharmacist, or health care provider.  2020 Elsevier/Gold Standard (2017-05-23 18:06:11)   

## 2020-02-09 ENCOUNTER — Telehealth: Payer: Self-pay | Admitting: *Deleted

## 2020-02-09 ENCOUNTER — Telehealth: Payer: Self-pay

## 2020-02-09 NOTE — Telephone Encounter (Signed)
Patient calls with complaint of bloating and gas pain.  She is asking advice on what she can take for these symptoms.

## 2020-02-09 NOTE — Telephone Encounter (Signed)
Called to confirm her bowels were working well and she reports having good BM daily. Informed her she can try OTC Gas-X, Mylicon the gas and bloating. Noted she did not come in for her dietician appointment. Sent staff message to dietician to see if a telephone consult could take place.

## 2020-02-10 ENCOUNTER — Ambulatory Visit: Payer: Medicare Other | Admitting: Cardiovascular Disease

## 2020-02-11 ENCOUNTER — Other Ambulatory Visit: Payer: Self-pay | Admitting: Oncology

## 2020-02-11 ENCOUNTER — Telehealth: Payer: Self-pay

## 2020-02-11 ENCOUNTER — Telehealth: Payer: Self-pay | Admitting: *Deleted

## 2020-02-11 MED ORDER — OXYCODONE HCL 5 MG PO TABS: ORAL_TABLET | ORAL | 0 refills | Status: DC

## 2020-02-11 NOTE — Telephone Encounter (Signed)
Reports she has now developed hives that itch on her waistband and axilla (last tx 02/06/20). Denies any recent exposure to new soap or detergent or animals. Asking if OK to take Benadryl? Informed her OK to take OTC Benadryl 25-50 mg every 6 hours as needed. She also needs refill on the Oxycodone.

## 2020-02-11 NOTE — Telephone Encounter (Signed)
Made pt aware refill request for Oxycodone completed.

## 2020-02-12 ENCOUNTER — Encounter: Payer: Self-pay | Admitting: General Practice

## 2020-02-12 NOTE — Progress Notes (Signed)
[facility] CSW Progress Notes  Call from daughter, Sherre Poot [phone]), she is interested in caregiver support/resources.  Provided information on Support Center activities and outside options ([place]).  Family is very close knit, trying to find ways to support patient while she is in treatment.  Company secretary.  Edwyna Shell, LCSW Clinical Social Worker Phone:  [phone]

## 2020-02-15 ENCOUNTER — Other Ambulatory Visit: Payer: Self-pay | Admitting: Oncology

## 2020-02-17 ENCOUNTER — Encounter: Payer: Self-pay | Admitting: Oncology

## 2020-02-17 NOTE — Progress Notes (Signed)
Called pt to introduce myself as her Arboriculturist.  Pt has 2 insurances so copay assistance shouldn't be needed.  I asked her if she needed any type of assistance w/ personal bills or gas cards while going thru treatment, she wants to discuss that w/ her husband so I gave her the income requirement in case she would like to apply.  She stated she doesn't think she would qualify but will discuss w/ her husband.  I told her I will request the registration staff give her my card so she can contact me if she would like to apply.

## 2020-02-18 ENCOUNTER — Inpatient Hospital Stay: Payer: Medicare Other | Attending: Oncology | Admitting: Genetic Counselor

## 2020-02-18 ENCOUNTER — Inpatient Hospital Stay: Payer: Medicare Other

## 2020-02-18 ENCOUNTER — Encounter: Payer: Self-pay | Admitting: Genetic Counselor

## 2020-02-18 ENCOUNTER — Other Ambulatory Visit: Payer: Self-pay

## 2020-02-18 DIAGNOSIS — C259 Malignant neoplasm of pancreas, unspecified: Secondary | ICD-10-CM | POA: Diagnosis present

## 2020-02-18 DIAGNOSIS — C251 Malignant neoplasm of body of pancreas: Secondary | ICD-10-CM | POA: Insufficient documentation

## 2020-02-18 DIAGNOSIS — Z803 Family history of malignant neoplasm of breast: Secondary | ICD-10-CM | POA: Diagnosis not present

## 2020-02-18 DIAGNOSIS — J449 Chronic obstructive pulmonary disease, unspecified: Secondary | ICD-10-CM | POA: Insufficient documentation

## 2020-02-18 DIAGNOSIS — Z800 Family history of malignant neoplasm of digestive organs: Secondary | ICD-10-CM | POA: Diagnosis not present

## 2020-02-18 DIAGNOSIS — Z5111 Encounter for antineoplastic chemotherapy: Secondary | ICD-10-CM | POA: Insufficient documentation

## 2020-02-18 DIAGNOSIS — R918 Other nonspecific abnormal finding of lung field: Secondary | ICD-10-CM | POA: Insufficient documentation

## 2020-02-18 DIAGNOSIS — G893 Neoplasm related pain (acute) (chronic): Secondary | ICD-10-CM | POA: Insufficient documentation

## 2020-02-18 NOTE — Progress Notes (Signed)
Nutrition Assessment   Reason for Assessment:  Referral for weight loss and poor appetite   ASSESSMENT:  69 year old female with pancreas cancer.  Past medical history of anemia, COPD, GERD, HLD, HTN.  Patient receiving abraxane/gemcitabine.  Met with patient and husband in clinic.  Patient reports that she has no appetite.  Has had issues with nausea but better when taking nausea medications. Reports issues with gas/bloating. Reports can eat something one day and not have problems (ie milk) but then another day have issues with gas and bloating.  Medications have been helping.  Reports able to drink 1 ensure original per day (220 calories, 9 g protein).  Has been eating some raisin bran cereal mixed with danon drinkable yogurt (4 oz), eating 1 piece of fruit per day. Had shrimp lo mein recently that she tolerated, vite ramen noodles (500 calories, 26 g protein). Also able to tolerate chicken and egg.  Unable to tolerate peanut butter or milk.  Has been trying to eat small frequent meals.   Medications: reviewed   Labs: reviewed   Anthropometrics:   Height: 67 inches Weight: 133 lb (11/26) 20 lb weight loss since end of October BMI: 20  13% weight loss in the last 2 months, significant   Estimated Energy Needs  Kcals: 1800-2100 Protein: 90-105 g Fluid: 1.8 L   NUTRITION DIAGNOSIS: Inadequate oral intake related to cancer and cancer related treatment side effects as evidenced by 13% weight loss in the last 2 months    INTERVENTION:  Encouraged small frequent meals. Provided samples of ensure complete, k[employer] shake and orgain shake to try (higher calorie shakes). Provided recipes and strategies to increase calories and protein (adding cheese to eggs, chopped chicken to vite ramen noodles and broth) Contact information provided Encouraged taking nausea medication and anti gas medications.    MONITORING, EVALUATION, GOAL: weight trends, intake   Next Visit: Jan 4  phone call  Ahmeer Tuman B. Zenia Resides, [place], [place] Registered Dietitian [phone] (mobile)

## 2020-02-18 NOTE — Progress Notes (Signed)
REFERRING PROVIDER: Ladell Pier, MD [address] [place],  [place] [address]  PRIMARY PROVIDER:  Nickola Major, MD  PRIMARY REASON FOR VISIT:  1. Pancreatic adenocarcinoma ([place])   2. Family history of breast cancer   3. Family history of colon cancer      HISTORY OF PRESENT ILLNESS:   Rebecca Weaver, a 69 y.o. female, was seen for a [place] cancer genetics consultation at the request of Dr. Benay Spice due to a personal and family history of cancer.  Rebecca Weaver presents to clinic today to discuss the possibility of a hereditary predisposition to cancer, genetic testing, and to further clarify her future cancer risks, as well as potential cancer risks for family members.   In November 2021, at the age of 30, Rebecca Weaver was diagnosed with pancreatic cancer. The treatment plan includes chemotherapy and possible surgery.      CANCER HISTORY:  Oncology History  Pancreatic adenocarcinoma ([place])  01/28/2020 Initial Diagnosis   Pancreatic adenocarcinoma ([place])   02/06/2020 -  Chemotherapy   The patient had PACLitaxel-protein bound (ABRAXANE) chemo infusion 200 mg, 120 mg/m2 = 225 mg, Intravenous,  Once, 1 of 4 cycles Administration: 200 mg (02/06/2020) gemcitabine (GEMZAR) 1,710 mg in sodium chloride 0.9 % 250 mL chemo infusion, 1,000 mg/m2 = 1,710 mg, Intravenous,  Once, 1 of 4 cycles Administration: 1,710 mg (02/06/2020)  for chemotherapy treatment.       RISK FACTORS:  Menarche was at age 41.  First live birth at age 52.  OCP use for approximately <5 years.  Ovaries intact: yes.  Hysterectomy: no.  Menopausal status: postmenopausal.  HRT use: 0 years. Colonoscopy: yes; normal. Mammogram within the last year: yes. Number of breast biopsies: 0. Up to date with pelvic exams: yes. Any excessive radiation exposure in the past: no  Past Medical History:  Diagnosis Date  . Anemia   . Arthritis   . COPD (chronic obstructive pulmonary disease) ([place])   .  Coronary artery calcification   . Family history of breast cancer   . Family history of colon cancer   . GERD (gastroesophageal reflux disease)   . Hyperlipidemia   . Hypertension   . Lung nodules   . Osteopenia   . Pseudotumor cerebri     Past Surgical History:  Procedure Laterality Date  . CARDIOVERSION N/A 01/29/2020   Procedure: CARDIOVERSION;  Surgeon: Buford Dresser, MD;  Location[facility] ENDOSCOPY;  Service: Cardiovascular;  Laterality: N/A;  . CHOLECYSTECTOMY    . ESOPHAGOGASTRODUODENOSCOPY (EGD) WITH PROPOFOL N/A 01/02/2020   Procedure: ESOPHAGOGASTRODUODENOSCOPY (EGD) WITH PROPOFOL;  Surgeon: [name], MD;  Location: [facility] ENDOSCOPY;  Service: Endoscopy;  Laterality: N/A;  . FINE NEEDLE ASPIRATION N/A 01/02/2020   Procedure: FINE NEEDLE ASPIRATION (FNA) LINEAR;  Surgeon: [name], MD;  Location: [facility] ENDOSCOPY;  Service: Endoscopy;  Laterality: N/A;  . IR IMAGING GUIDED PORT INSERTION  01/20/2020  . TEE WITHOUT CARDIOVERSION N/A 01/29/2020   Procedure: TRANSESOPHAGEAL ECHOCARDIOGRAM (TEE);  Surgeon: Buford Dresser, MD;  Location: [facility];  Service: Cardiovascular;  Laterality: N/A;  . TUBAL LIGATION    . UPPER ESOPHAGEAL ENDOSCOPIC ULTRASOUND (EUS) N/A 01/02/2020   Procedure: UPPER ESOPHAGEAL ENDOSCOPIC ULTRASOUND (EUS);  Surgeon: [name], MD;  Location: [facility] ENDOSCOPY;  Service: Endoscopy;  Laterality: N/A;  . VARICOSE VEIN SURGERY     multiple    Social History   Socioeconomic History  . Marital status: Married    Spouse name: Not on file  . Number of children:  2  . Years of education: Not on file  . Highest education level: Not on file  Occupational History  . Occupation: retired - Development worker, community  Tobacco Use  . Smoking status: Current Every Day Smoker    Packs/day: 0.75    Years: 50.00    Pack years: 37.50    Types: Cigarettes  . Smokeless tobacco: Never Used  Substance and Sexual Activity  . Alcohol use: Yes    Alcohol/week: 0.0  standard drinks    Comment: social occassions only  . Drug use: Not Currently  . Sexual activity: Not on file  Other Topics Concern  . Not on file  Social History Narrative  . Not on file   Social Determinants of Health   Financial Resource Strain: Low Risk   . Difficulty of Paying Living Expenses: Not hard at all  Food Insecurity:   . Worried About Charity fundraiser in the Last Year: Not on file  . Ran Out of Food in the Last Year: Not on file  Transportation Needs: No Transportation Needs  . Lack of Transportation (Medical): No  . Lack of Transportation (Non-Medical): No  Physical Activity: Insufficiently Active  . Days of Exercise per Week: 5 days  . Minutes of Exercise per Session: 20 min  Stress: No Stress Concern Present  . Feeling of Stress : Not at all  Social Connections: Unknown  . Frequency of Communication with Friends and Family: More than three times a week  . Frequency of Social Gatherings with Friends and Family: More than three times a week  . Attends Religious Services: Not on file  . Active Member of Clubs or Organizations: Not on file  . Attends Archivist Meetings: Not on file  . Marital Status: Married     FAMILY HISTORY:  We obtained a detailed, 4-generation family history.  Significant diagnoses are listed below: Family History  Problem Relation Age of Onset  . Varicose Veins Father   . Heart disease Father   . Varicose Veins Sister     The patient has two daughters, one who had breast cancer at 45 and is a known BRCA mutation carrier.  It is unclear which BRCA gene the mutation is in.  The patient has two brothers and three sisters who are cancer free.  Both parents are deceased.  The patient's father died at 70 from heart issues.  He had three sisters, two had colon cancer, one under age 38 and the other over age 64.  The third is living and an unknown cancer.  There is no other cancer known on this side of the family.  The patient's  mother died of COPD at 53.  She had two brothers who were cancer free.  The maternal grandparents were not reported to have cancer.  Rebecca Weaver is aware of previous family history of genetic testing for hereditary cancer risks. Patient's maternal ancestors are of [place] descent, and paternal ancestors are of [place] descent. There is reported Ashkenazi Jewish ancestry. There is no known consanguinity.  GENETIC COUNSELING ASSESSMENT: Rebecca Weaver is a 69 y.o. female with a personal and family history of cancer which is somewhat suggestive of a hereditary cancer syndrome and predisposition to cancer given her diagnosis of pancreatic cancer, the young age of onset of breast cancer in her daughter and known BRCA mutation. We, therefore, discussed and recommended the following at today's visit.   DISCUSSION: We discussed that up to 15% of pancreatic cancer is hereditary,  with most cases associated with BRCA mutations.  There are other genes that can be associated with hereditary pancreatic cancer syndromes.  These include ATM, PALB2, VHL and Lynch syndrome genes. She reports that her daughter had breast cancer at 61 and was found to have a BRCA mutation.  It is unknown which gene, BRCA1 or BRCA2 that the mutation is in.  We discussed that there is a 50% chance that she could test positive for the BRCA mutation in her daughter.  If she is positive, we would suspect it is coming from her father's side of the family as we know that [place] population has three founder mutations that are more common in that population. We discussed that testing is beneficial for several reasons including knowing how to follow individuals after completing their treatment, identifying whether potential treatment options such as PARP inhibitors would be beneficial, and understand if other family members could be at risk for cancer and allow them to undergo genetic testing.   We reviewed the characteristics, features and  inheritance patterns of hereditary cancer syndromes. We also discussed genetic testing, including the appropriate family members to test, the process of testing, insurance coverage and turn-around-time for results. We discussed the implications of a negative, positive, carrier and/or variant of uncertain significant result. We recommended Rebecca Weaver pursue genetic testing for the Pancreatic cancer, preliminary evidence and pancreatitis gene panel.   Based on Rebecca Weaver's personal and family history of cancer, she meets medical criteria for genetic testing. Despite that she meets criteria, she decided to pursue testing through the DETECT Pancreatic cancer program.  This program will cover her genetic testing, and in turn sh agrees to allow de-identified information be provided to a third party.     PLAN: After considering the risks, benefits, and limitations, Rebecca Weaver provided informed consent to pursue genetic testing and the blood sample was sent to [facility] for analysis of the DETECT Pancreatic cancer, pancreatitis and preliminary evidence gene panel. Results should be available within approximately 2-3 weeks' time, at which point they will be disclosed by telephone to Rebecca Weaver, as will any additional recommendations warranted by these results. Rebecca Weaver will receive a summary of her genetic counseling visit and a copy of her results once available. This information will also be available in Epic.   Lastly, we encouraged Rebecca Weaver to remain in contact with cancer genetics annually so that we can continuously update the family history and inform her of any changes in cancer genetics and testing that may be of benefit for this family.   Rebecca Weaver questions were answered to her satisfaction today. Our contact information was provided should additional questions or concerns arise. Thank you for the referral and allowing [place] to share in the care of your patient.    Briea Weaver P. Florene Glen, [place], [facility] Licensed, Insurance risk surveyor [email or web address] phone: [phone]  The patient was seen for a total of 30 minutes in face-to-face genetic counseling.  This patient was discussed with Drs. Magrinat, Lindi Adie and/or Burr Medico who agrees with the above.    _______________________________________________________________________ For Office Staff:  Number of people involved in session: 2 Was an Intern/ student involved with case: no

## 2020-02-20 ENCOUNTER — Ambulatory Visit: Payer: Medicare Other

## 2020-02-20 ENCOUNTER — Inpatient Hospital Stay: Payer: Medicare Other

## 2020-02-20 ENCOUNTER — Ambulatory Visit: Payer: Medicare Other | Admitting: Nurse Practitioner

## 2020-02-20 ENCOUNTER — Inpatient Hospital Stay (HOSPITAL_BASED_OUTPATIENT_CLINIC_OR_DEPARTMENT_OTHER): Payer: Medicare Other | Admitting: Oncology

## 2020-02-20 ENCOUNTER — Other Ambulatory Visit: Payer: Medicare Other

## 2020-02-20 ENCOUNTER — Other Ambulatory Visit: Payer: Self-pay

## 2020-02-20 VITALS — BP 95/72 | HR 68 | Temp 97.4°F | Resp 17 | Ht 67.0 in | Wt 129.8 lb

## 2020-02-20 DIAGNOSIS — J449 Chronic obstructive pulmonary disease, unspecified: Secondary | ICD-10-CM | POA: Diagnosis not present

## 2020-02-20 DIAGNOSIS — Z5111 Encounter for antineoplastic chemotherapy: Secondary | ICD-10-CM | POA: Diagnosis not present

## 2020-02-20 DIAGNOSIS — C259 Malignant neoplasm of pancreas, unspecified: Secondary | ICD-10-CM

## 2020-02-20 DIAGNOSIS — G893 Neoplasm related pain (acute) (chronic): Secondary | ICD-10-CM | POA: Diagnosis not present

## 2020-02-20 DIAGNOSIS — Z95828 Presence of other vascular implants and grafts: Secondary | ICD-10-CM

## 2020-02-20 DIAGNOSIS — C251 Malignant neoplasm of body of pancreas: Secondary | ICD-10-CM | POA: Diagnosis present

## 2020-02-20 DIAGNOSIS — R918 Other nonspecific abnormal finding of lung field: Secondary | ICD-10-CM | POA: Diagnosis not present

## 2020-02-20 LAB — CBC WITH DIFFERENTIAL (CANCER CENTER ONLY)
Abs Immature Granulocytes: 0.02 10*3/uL (ref 0.00–0.07)
Basophils Absolute: 0 10*3/uL (ref 0.0–0.1)
Basophils Relative: 1 %
Eosinophils Absolute: 0.1 10*3/uL (ref 0.0–0.5)
Eosinophils Relative: 3 %
HCT: 39 % (ref 36.0–46.0)
Hemoglobin: 13.5 g/dL (ref 12.0–15.0)
Immature Granulocytes: 0 %
Lymphocytes Relative: 23 %
Lymphs Abs: 1.3 10*3/uL (ref 0.7–4.0)
MCH: 33.8 pg (ref 26.0–34.0)
MCHC: 34.6 g/dL (ref 30.0–36.0)
MCV: 97.7 fL (ref 80.0–100.0)
Monocytes Absolute: 0.7 10*3/uL (ref 0.1–1.0)
Monocytes Relative: 13 %
Neutro Abs: 3.4 10*3/uL (ref 1.7–7.7)
Neutrophils Relative %: 60 %
Platelet Count: 212 10*3/uL (ref 150–400)
RBC: 3.99 MIL/uL (ref 3.87–5.11)
RDW: 12.1 % (ref 11.5–15.5)
WBC Count: 5.5 10*3/uL (ref 4.0–10.5)
nRBC: 0 % (ref 0.0–0.2)

## 2020-02-20 LAB — CMP (CANCER CENTER ONLY)
ALT: 17 U/L (ref 0–44)
AST: 17 U/L (ref 15–41)
Albumin: 3.3 g/dL — ABNORMAL LOW (ref 3.5–5.0)
Alkaline Phosphatase: 70 U/L (ref 38–126)
Anion gap: 8 (ref 5–15)
BUN: 9 mg/dL (ref 8–23)
CO2: 29 mmol/L (ref 22–32)
Calcium: 9.1 mg/dL (ref 8.9–10.3)
Chloride: 99 mmol/L (ref 98–111)
Creatinine: 0.9 mg/dL (ref 0.44–1.00)
GFR, Estimated: >60 mL/min (ref >60)
Glucose, Bld: 125 mg/dL — ABNORMAL HIGH (ref 70–99)
Potassium: 3.7 mmol/L (ref 3.5–5.1)
Sodium: 136 mmol/L (ref 135–145)
Total Bilirubin: 0.3 mg/dL (ref 0.3–1.2)
Total Protein: 6.1 g/dL — ABNORMAL LOW (ref 6.5–8.1)

## 2020-02-20 MED ORDER — PACLITAXEL PROTEIN-BOUND CHEMO INJECTION 100 MG
200.0000 mg | Freq: Once | INTRAVENOUS | Status: AC
  Administered 2020-02-20: 200 mg via INTRAVENOUS
  Filled 2020-02-20: qty 40

## 2020-02-20 MED ORDER — HEPARIN SOD (PORK) LOCK FLUSH 100 UNIT/ML IV SOLN
500.0000 [IU] | Freq: Once | INTRAVENOUS | Status: AC | PRN
Start: 2020-02-20 — End: 2020-02-20
  Administered 2020-02-20: 500 [IU]
  Filled 2020-02-20: qty 5

## 2020-02-20 MED ORDER — SODIUM CHLORIDE 0.9% FLUSH
10.0000 mL | Freq: Once | INTRAVENOUS | Status: AC
  Administered 2020-02-20: 10 mL
  Filled 2020-02-20: qty 10

## 2020-02-20 MED ORDER — PALONOSETRON HCL INJECTION 0.25 MG/5ML
INTRAVENOUS | Status: AC
  Filled 2020-02-20: qty 5

## 2020-02-20 MED ORDER — SODIUM CHLORIDE 0.9 % IV SOLN
Freq: Once | INTRAVENOUS | Status: AC
  Filled 2020-02-20: qty 250

## 2020-02-20 MED ORDER — SODIUM CHLORIDE 0.9% FLUSH
10.0000 mL | INTRAVENOUS | Status: DC | PRN
Start: 2020-02-20 — End: 2020-02-20
  Administered 2020-02-20: 10 mL
  Filled 2020-02-20: qty 10

## 2020-02-20 MED ORDER — SODIUM CHLORIDE 0.9 % IV SOLN
1000.0000 mg/m2 | Freq: Once | INTRAVENOUS | Status: AC
  Administered 2020-02-20: 1710 mg via INTRAVENOUS
  Filled 2020-02-20: qty 44.97

## 2020-02-20 MED ORDER — SODIUM CHLORIDE 0.9 % IV SOLN
10.0000 mg | Freq: Once | INTRAVENOUS | Status: AC
  Administered 2020-02-20: 10 mg via INTRAVENOUS
  Filled 2020-02-20: qty 10

## 2020-02-20 MED ORDER — PALONOSETRON HCL INJECTION 0.25 MG/5ML
0.2500 mg | Freq: Once | INTRAVENOUS | Status: AC
  Administered 2020-02-20: 0.25 mg via INTRAVENOUS

## 2020-02-20 NOTE — Patient Instructions (Signed)
[facility] Discharge Instructions for Patients Receiving Chemotherapy  Today you received the following chemotherapy agents abraxane, gemzar  To help prevent nausea and vomiting after your treatment, we encourage you to take your nausea medication as directed.   If you develop nausea and vomiting that is not controlled by your nausea medication, call the clinic.   BELOW ARE SYMPTOMS THAT SHOULD BE REPORTED IMMEDIATELY:  *FEVER GREATER THAN 100.5 F  *CHILLS WITH OR WITHOUT FEVER  NAUSEA AND VOMITING THAT IS NOT CONTROLLED WITH YOUR NAUSEA MEDICATION  *UNUSUAL SHORTNESS OF BREATH  *UNUSUAL BRUISING OR BLEEDING  TENDERNESS IN MOUTH AND THROAT WITH OR WITHOUT PRESENCE OF ULCERS  *URINARY PROBLEMS  *BOWEL PROBLEMS  UNUSUAL RASH Items with * indicate a potential emergency and should be followed up as soon as possible.  Feel free to call the clinic should you have any questions or concerns. The clinic phone number is [phone].  Please show the CHEMO ALERT CARD at check-in to the Emergency Department and triage nurse.   

## 2020-02-20 NOTE — Progress Notes (Signed)
[facility] OFFICE PROGRESS NOTE   Diagnosis: Pancreas cancer  INTERVAL HISTORY:   Ms. Adcox completed cycle 1 gemcitabine/Abraxane on 02/06/2020.  She had nausea and vomiting on day 2.  She developed a pruritic hive-like rash on day 4.  She was unable to take Benadryl as this causes anxiety.  Abdominal pain has partially improved.  She met with the nutritionist and is working on her oral intake.  Objective:  Vital signs in last 24 hours:  Blood pressure 95/72, pulse 68, temperature (!) 97.4 F (36.3 C), temperature source Tympanic, resp. rate 17, height _0  (1.702 m), weight 129 lb 12.8 oz (58.9 kg), SpO2 98 %.    HEENT: No thrush or ulcers Resp: Distant breath sounds, no respiratory distress Cardio: Regular rhythm with premature beats and pauses GI: No hepatosplenomegaly, no mass Vascular: No leg edema  Skin: No rash, ecchymoses over the forearms  Portacath/PICC-without erythema  Lab Results:  Lab Results  Component Value Date   WBC 5.5 02/20/2020   HGB 13.5 02/20/2020   HCT 39.0 02/20/2020   MCV 97.7 02/20/2020   PLT 212 02/20/2020   NEUTROABS 3.4 02/20/2020    CMP  Lab Results  Component Value Date   NA 137 02/03/2020   K 4.0 02/03/2020   CL 98 02/03/2020   CO2 23 02/03/2020   GLUCOSE 104 (H) 02/03/2020   BUN 8 02/03/2020   CREATININE 0.76 02/03/2020   CALCIUM 9.1 02/03/2020   PROT 7.0 02/03/2020   ALBUMIN 3.9 02/03/2020   AST 23 02/03/2020   ALT 14 02/03/2020   ALKPHOS 68 02/03/2020   BILITOT 0.6 02/03/2020   GFRNONAA >60 02/03/2020   GFRAA 97 01/26/2020    Medications: I have reviewed the patient's current medications.   Assessment/Plan: 1. Pancreas cancer, adenocarcinoma the pancreas body, status post a CT-guided biopsy 01/09/2020  CTs 12/29/2019-pancreas body mass adjacent to the SMA, 11 mm right upper lobe nodule and 6 mm left upper lobe nodule as well as other tiny lung nodules new compared to a CT from 11/21/2017.   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   Abdomen/back pain secondary to #1 3.   Anorexia/weight loss secondary to #1 4.   Atrial flutter 5.   COPD 6.   Port-A-Cath placement 01/20/2020     Disposition: Rebecca Weaver has completed 1 treatment with gemcitabine/Abraxane.  Treatment was complicated by nausea/vomiting and a rash.  She will receive Aloxi and Decadron premedication with this cycle of chemotherapy.  She will call for nausea following chemotherapy.  We will prescribe a steroid Dosepak if she develops a rash.  She will return for an office visit and the next cycle of chemotherapy on 03/03/2020.  Her blood pressure has been running low.  She will hold Benzapril.  She continues oxycodone as needed for pain.  Betsy Coder, MD  02/20/2020  9:03 AM

## 2020-02-20 NOTE — Addendum Note (Signed)
Addended by: Tania Ade on: 02/20/2020 10:23 AM   Modules accepted: Orders

## 2020-02-24 ENCOUNTER — Telehealth: Payer: Self-pay

## 2020-02-24 NOTE — Telephone Encounter (Signed)
Patient received gemzar/abraxane on Friday 12/10, onset of flu like symptoms on Sunday 12/12, muscle aches, fatigue, no appetite, sleeping a lot.  Denies fevers. Able to drink at least 40 ounces of fluids per day.  I explained it is most likely from her treatment however will consult with Dr. Benay Spice and call her back.

## 2020-02-24 NOTE — Telephone Encounter (Signed)
Called patient back to let her know that Dr. Benay Spice agrees symptoms she is experiencing coming from Gemcitabine.  I have instructed him to continue to push her po fluids and attempt to drink protein/supplement shakes during this time period.  She verbalized an understanding and knows to call back if condition worsens.

## 2020-02-25 ENCOUNTER — Telehealth: Payer: Self-pay | Admitting: Nurse Practitioner

## 2020-02-25 NOTE — Telephone Encounter (Signed)
Scheduled appts per 12/10 los and 12/14 staff msg. Pt confirmed appt date and time.

## 2020-02-29 ENCOUNTER — Other Ambulatory Visit: Payer: Self-pay | Admitting: Oncology

## 2020-02-29 NOTE — Progress Notes (Signed)
Cardiology Office Note:   Date:  03/01/2020  NAME:  Rebecca Weaver    MRN: [id number] DOB:  01-Mar-1951   PCP:  Nickola Major, MD  Cardiologist:  Evalina Field, MD  Electrophysiologist:  None   Referring MD: Nickola Major, MD   Chief Complaint  Patient presents with  . Follow-up   History of Present Illness:   DELAYNI STREED is a 69 y.o. female with a hx of pancreatic CA, COPD, HTN, and atrial flutter who presents for follow-up. Underwent TEE/DCCV 11/28. On chemo. Reports profound fatigue with this. She is monitoring her HR with apple watch. No recurrent Afib. EKG with NSR and PACs. On eliquis. No bleeding. May need surgery for pancreatic CA. Starting with chemo first. Activity is low. Denies any significant SOB or chest pain. Overall, doing well from cardiac standpoint. Denies CP, SOB, or palpitations in office. Still smoking.   Problem List 1. COPD 2. HTN 3. Pancreatic adenocarcinoma  4. Atrial flutter -Dx 12/2019 -CHADSVASC=3 (age, sex, HTN) -Ef 50-55% -TEE/DCCV 01/29/2020 5. Tobacco abuse  Past Medical History: Past Medical History:  Diagnosis Date  . Anemia   . Arrhythmia    Atrial fibrillation s/p TEE/DCCV 01/29/2020  . Arthritis   . COPD (chronic obstructive pulmonary disease) ([place])   . Coronary artery calcification   . Family history of breast cancer   . Family history of colon cancer   . GERD (gastroesophageal reflux disease)   . Hyperlipidemia   . Hypertension   . Lung nodules   . Osteopenia   . Pseudotumor cerebri     Past Surgical History: Past Surgical History:  Procedure Laterality Date  . CARDIOVERSION N/A 01/29/2020   Procedure: CARDIOVERSION;  Surgeon: Buford Dresser, MD;  Location: [facility] ENDOSCOPY;  Service: Cardiovascular;  Laterality: N/A;  . CHOLECYSTECTOMY    . ESOPHAGOGASTRODUODENOSCOPY (EGD) WITH PROPOFOL N/A 01/02/2020   Procedure: ESOPHAGOGASTRODUODENOSCOPY (EGD) WITH PROPOFOL;  Surgeon: [name], MD;  Location: [facility]  ENDOSCOPY;  Service: Endoscopy;  Laterality: N/A;  . FINE NEEDLE ASPIRATION N/A 01/02/2020   Procedure: FINE NEEDLE ASPIRATION (FNA) LINEAR;  Surgeon: [name], MD;  Location: [facility] ENDOSCOPY;  Service: Endoscopy;  Laterality: N/A;  . IR IMAGING GUIDED PORT INSERTION  01/20/2020  . TEE WITHOUT CARDIOVERSION N/A 01/29/2020   Procedure: TRANSESOPHAGEAL ECHOCARDIOGRAM (TEE);  Surgeon: Buford Dresser, MD;  Location: [place];  Service: Cardiovascular;  Laterality: N/A;  . TUBAL LIGATION    . UPPER ESOPHAGEAL ENDOSCOPIC ULTRASOUND (EUS) N/A 01/02/2020   Procedure: UPPER ESOPHAGEAL ENDOSCOPIC ULTRASOUND (EUS);  Surgeon: [name], MD;  Location: [facility] ENDOSCOPY;  Service: Endoscopy;  Laterality: N/A;  . VARICOSE VEIN SURGERY     multiple    Current Medications: Current Meds  Medication Sig  . acetaminophen (TYLENOL) 500 MG tablet Take 1,000 mg by mouth every 6 (six) hours as needed for moderate pain or headache.  . Cyanocobalamin (B-12 COMPLIANCE INJECTION IJ) Inject 1 Dose as directed every 30 (thirty) days.  Marland Kitchen ezetimibe (ZETIA) 10 MG tablet Take 10 mg by mouth daily.  . famotidine (PEPCID) 20 MG tablet Take 20 mg by mouth daily.  . fluticasone (FLONASE) 50 MCG/ACT nasal spray Place 1 spray into both nostrils 2 (two) times daily.   . Fluticasone-Salmeterol (ADVAIR) 250-50 MCG/DOSE AEPB Inhale 1 puff into the lungs 2 (two) times daily.  . furosemide (LASIX) 20 MG tablet Take 1 tablet (20 mg total) by mouth daily.  Marland Kitchen lidocaine-prilocaine (EMLA) cream Apply 1 application topically as directed. Apply  1 hour prior to port access and cover with plastic wrap  . loratadine (CLARITIN) 10 MG tablet Take 10 mg by mouth daily.  Marland Kitchen Naphazoline-Pheniramine (VISINE-A OP) Place 1 drop into both eyes daily.  . ondansetron (ZOFRAN) 8 MG tablet Take 1 tablet (8 mg total) by mouth every 8 (eight) hours as needed for nausea or vomiting.  Marland Kitchen oxyCODONE (OXY IR/ROXICODONE) 5 MG immediate release tablet 1  to 2 PO Q 4 hours prn pain  . [DISCONTINUED] apixaban (ELIQUIS) 5 MG TABS tablet Take 1 tablet (5 mg total) by mouth 2 (two) times daily. Do not start this until you have the pancreatic biopsy done by GI.  . [DISCONTINUED] diltiazem (CARDIZEM CD) 120 MG 24 hr capsule Take 2 capsules (240 mg total) by mouth daily.     Allergies:    Atorvastatin, Ciprofloxacin, and Sulfa antibiotics   Social History: Social History   Socioeconomic History  . Marital status: Married    Spouse name: Not on file  . Number of children: 2  . Years of education: Not on file  . Highest education level: Not on file  Occupational History  . Occupation: retired - Development worker, community  Tobacco Use  . Smoking status: Current Every Day Smoker    Packs/day: 0.75    Years: 50.00    Pack years: 37.50    Types: Cigarettes  . Smokeless tobacco: Never Used  Substance and Sexual Activity  . Alcohol use: Yes    Alcohol/week: 0.0 standard drinks    Comment: social occassions only  . Drug use: Not Currently  . Sexual activity: Not on file  Other Topics Concern  . Not on file  Social History Narrative  . Not on file   Social Determinants of Health   Financial Resource Strain: Low Risk   . Difficulty of Paying Living Expenses: Not hard at all  Food Insecurity: Not on file  Transportation Needs: No Transportation Needs  . Lack of Transportation (Medical): No  . Lack of Transportation (Non-Medical): No  Physical Activity: Insufficiently Active  . Days of Exercise per Week: 5 days  . Minutes of Exercise per Session: 20 min  Stress: No Stress Concern Present  . Feeling of Stress : Not at all  Social Connections: Unknown  . Frequency of Communication with Friends and Family: More than three times a week  . Frequency of Social Gatherings with Friends and Family: More than three times a week  . Attends Religious Services: Not on file  . Active Member of Clubs or Organizations: Not on file  . Attends Theatre manager Meetings: Not on file  . Marital Status: Married    Family History: The patient's family history includes Heart disease in her father; Varicose Veins in her father and sister.  ROS:   All other ROS reviewed and negative. Pertinent positives noted in the HPI.     EKGs/Labs/Other Studies Reviewed:   The following studies were personally reviewed by me today:  EKG:  EKG is ordered today.  The ekg ordered today demonstrates normal sinus rhythm heart rate 78, right bundle branch block noted, inferior T wave inversions noted, and was personally reviewed by me.   TTE 12/30/2019 1. Left ventricular ejection fraction, by estimation, is 50 to 55%. The  left ventricle has low normal function. The left ventricle has no regional  wall motion abnormalities. Left ventricular diastolic parameters are  indeterminate.  2. Right ventricular systolic function is low normal. The right  ventricular size  is mildly enlarged. Mildly increased right ventricular  wall thickness.  3. Left atrial size was severely dilated.  4. The mitral valve is grossly normal. Trivial mitral valve  regurgitation.  5. The aortic valve is tricuspid. Aortic valve regurgitation is not  visualized.  6. The inferior vena cava is dilated in size with <50% respiratory  variability, suggesting right atrial pressure of 15 mmHg.   Recent Labs: 12/29/2019: B Natriuretic Peptide 65.4 12/30/2019: TSH 2.679 12/31/2019: Magnesium 1.7 02/20/2020: ALT 17; BUN 9; Creatinine 0.90; Hemoglobin 13.5; Platelet Count 212; Potassium 3.7; Sodium 136   Recent Lipid Panel No results found for: CHOL, TRIG, HDL, CHOLHDL, VLDL, LDLCALC, LDLDIRECT  Physical Exam:   VS:  BP 100/70 (BP Location: Left Arm, Patient Position: Sitting)   Pulse 78   Ht 5\' 7"  (1.702 m)   Wt 128 lb 6.4 oz (58.2 kg)   SpO2 96%   BMI 20.11 kg/m    Wt Readings from Last 3 Encounters:  03/01/20 128 lb 6.4 oz (58.2 kg)  02/20/20 129 lb 12.8 oz (58.9 kg)   02/06/20 133 lb (60.3 kg)    General: Well nourished, well developed, in no acute distress Head: Atraumatic, normal size  Eyes: PEERLA, EOMI  Neck: Supple, no JVD Endocrine: No thryomegaly Cardiac: Normal S1, S2; RRR; no murmurs, rubs, or gallops Lungs: Clear to auscultation bilaterally, no wheezing, rhonchi or rales  Abd: Soft, nontender, no hepatomegaly  Ext: No edema, pulses 2+ Musculoskeletal: No deformities, BUE and BLE strength normal and equal Skin: Warm and dry, no rashes   Neuro: Alert and oriented to person, place, time, and situation, CNII-XII grossly intact, no focal deficits  Psych: Normal mood and affect   ASSESSMENT:   Rebecca Weaver is a 69 y.o. female who presents for the following: 1. Typical atrial flutter (HCC)     PLAN:   1. Typical atrial flutter ([place]) -s/p TEE/DCCV 01/29/2020. In NSR. CHADVASC=3. Continue eliquis. Reduce diltiazem to 120 mg XR daily. She will continue to monitor for Afib recurrence with apple watch. RTC in 3 months to see me. Refills provided.   Disposition: Return in about 3 months (around 05/30/2020).  Medication Adjustments/Labs and Tests Ordered: Current medicines are reviewed at length with the patient today.  Concerns regarding medicines are outlined above.  Orders Placed This Encounter  Procedures  . EKG 12-Lead   Meds ordered this encounter  Medications  . diltiazem (CARDIZEM CD) 120 MG 24 hr capsule    Sig: Take 1 capsule (120 mg total) by mouth daily.    Dispense:  90 capsule    Refill:  3  . apixaban (ELIQUIS) 5 MG TABS tablet    Sig: Take 1 tablet (5 mg total) by mouth 2 (two) times daily. Do not start this until you have the pancreatic biopsy done by GI.    Dispense:  180 tablet    Refill:  1    Patient Instructions  Medication Instructions:  Decrease diltiazem to 120 mg daily   *If you need a refill on your cardiac medications before your next appointment, please call your pharmacy*  Follow-Up: At [facility], you and your health needs are our priority.  As part of our continuing mission to provide you with exceptional heart care, we have created designated Provider Care Teams.  These Care Teams include your primary Cardiologist (physician) and Advanced Practice Providers (APPs -  Physician Assistants and Nurse Practitioners) who all work together to provide you with the care you  need, when you need it.  We recommend signing up for the patient portal called "MyChart".  Sign up information is provided on this After Visit Summary.  MyChart is used to connect with patients for Virtual Visits (Telemedicine).  Patients are able to view lab/test results, encounter notes, upcoming appointments, etc.  Non-urgent messages can be sent to your provider as well.   To learn more about what you can do with MyChart, go to [email or web address].    Your next appointment:   3 month(s)  The format for your next appointment:   In Person  Provider:   Eleonore Chiquito, MD       Time Spent with Patient: I have spent a total of 25 minutes with patient reviewing hospital notes, telemetry, EKGs, labs and examining the patient as well as establishing an assessment and plan that was discussed with the patient.  > 50% of time was spent in direct patient care.  Signed, Addison Naegeli. Audie Box, [place]  [address], [place], [place] [address] [phone]  03/01/2020 7:45 PM

## 2020-03-01 ENCOUNTER — Encounter: Payer: Self-pay | Admitting: Cardiovascular Disease

## 2020-03-01 ENCOUNTER — Ambulatory Visit (INDEPENDENT_AMBULATORY_CARE_PROVIDER_SITE_OTHER): Payer: Medicare Other | Admitting: Cardiovascular Disease

## 2020-03-01 ENCOUNTER — Other Ambulatory Visit: Payer: Self-pay

## 2020-03-01 VITALS — BP 100/70 | HR 78 | Ht 67.0 in | Wt 128.4 lb

## 2020-03-01 DIAGNOSIS — I483 Typical atrial flutter: Secondary | ICD-10-CM

## 2020-03-01 MED ORDER — APIXABAN 5 MG PO TABS: 5.0000 mg | ORAL_TABLET | Freq: Two times a day (BID) | ORAL | 1 refills | Status: AC

## 2020-03-01 MED ORDER — DILTIAZEM HCL ER COATED BEADS 120 MG PO CP24: 120.0000 mg | ORAL_CAPSULE | Freq: Every day | ORAL | 3 refills | Status: AC

## 2020-03-01 NOTE — Patient Instructions (Signed)
Medication Instructions:  Decrease diltiazem to 120 mg daily   *If you need a refill on your cardiac medications before your next appointment, please call your pharmacy*  Follow-Up: At [facility], you and your health needs are our priority.  As part of our continuing mission to provide you with exceptional heart care, we have created designated Provider Care Teams.  These Care Teams include your primary Cardiologist (physician) and Advanced Practice Providers (APPs -  Physician Assistants and Nurse Practitioners) who all work together to provide you with the care you need, when you need it.  We recommend signing up for the patient portal called "MyChart".  Sign up information is provided on this After Visit Summary.  MyChart is used to connect with patients for Virtual Visits (Telemedicine).  Patients are able to view lab/test results, encounter notes, upcoming appointments, etc.  Non-urgent messages can be sent to your provider as well.   To learn more about what you can do with MyChart, go to [email or web address].    Your next appointment:   3 month(s)  The format for your next appointment:   In Person  Provider:   Eleonore Chiquito, MD

## 2020-03-03 ENCOUNTER — Inpatient Hospital Stay: Payer: Medicare Other

## 2020-03-03 ENCOUNTER — Other Ambulatory Visit: Payer: Medicare Other

## 2020-03-03 ENCOUNTER — Other Ambulatory Visit: Payer: Self-pay | Admitting: *Deleted

## 2020-03-03 ENCOUNTER — Inpatient Hospital Stay (HOSPITAL_BASED_OUTPATIENT_CLINIC_OR_DEPARTMENT_OTHER): Payer: Medicare Other | Admitting: Oncology

## 2020-03-03 ENCOUNTER — Ambulatory Visit: Payer: Medicare Other | Admitting: Nurse Practitioner

## 2020-03-03 ENCOUNTER — Other Ambulatory Visit: Payer: Self-pay

## 2020-03-03 VITALS — BP 118/76 | HR 86 | Temp 96.4°F | Resp 18 | Ht 67.0 in | Wt 126.4 lb

## 2020-03-03 DIAGNOSIS — C259 Malignant neoplasm of pancreas, unspecified: Secondary | ICD-10-CM

## 2020-03-03 DIAGNOSIS — Z95828 Presence of other vascular implants and grafts: Secondary | ICD-10-CM

## 2020-03-03 DIAGNOSIS — Z5111 Encounter for antineoplastic chemotherapy: Secondary | ICD-10-CM | POA: Diagnosis not present

## 2020-03-03 LAB — CBC WITH DIFFERENTIAL (CANCER CENTER ONLY)
Abs Immature Granulocytes: 0.01 10*3/uL (ref 0.00–0.07)
Basophils Absolute: 0 10*3/uL (ref 0.0–0.1)
Basophils Relative: 1 %
Eosinophils Absolute: 0.2 10*3/uL (ref 0.0–0.5)
Eosinophils Relative: 4 %
HCT: 36.8 % (ref 36.0–46.0)
Hemoglobin: 13 g/dL (ref 12.0–15.0)
Immature Granulocytes: 0 %
Lymphocytes Relative: 23 %
Lymphs Abs: 1.3 10*3/uL (ref 0.7–4.0)
MCH: 34 pg (ref 26.0–34.0)
MCHC: 35.3 g/dL (ref 30.0–36.0)
MCV: 96.3 fL (ref 80.0–100.0)
Monocytes Absolute: 0.8 10*3/uL (ref 0.1–1.0)
Monocytes Relative: 14 %
Neutro Abs: 3.3 10*3/uL (ref 1.7–7.7)
Neutrophils Relative %: 58 %
Platelet Count: 126 10*3/uL — ABNORMAL LOW (ref 150–400)
RBC: 3.82 MIL/uL — ABNORMAL LOW (ref 3.87–5.11)
RDW: 12.6 % (ref 11.5–15.5)
WBC Count: 5.7 10*3/uL (ref 4.0–10.5)
nRBC: 0 % (ref 0.0–0.2)

## 2020-03-03 LAB — CMP (CANCER CENTER ONLY)
ALT: 21 U/L (ref 0–44)
AST: 17 U/L (ref 15–41)
Albumin: 3.3 g/dL — ABNORMAL LOW (ref 3.5–5.0)
Alkaline Phosphatase: 78 U/L (ref 38–126)
Anion gap: 11 (ref 5–15)
BUN: 6 mg/dL — ABNORMAL LOW (ref 8–23)
CO2: 28 mmol/L (ref 22–32)
Calcium: 9 mg/dL (ref 8.9–10.3)
Chloride: 100 mmol/L (ref 98–111)
Creatinine: 0.72 mg/dL (ref 0.44–1.00)
GFR, Estimated: >60 mL/min (ref >60)
Glucose, Bld: 90 mg/dL (ref 70–99)
Potassium: 3.3 mmol/L — ABNORMAL LOW (ref 3.5–5.1)
Sodium: 139 mmol/L (ref 135–145)
Total Bilirubin: 0.4 mg/dL (ref 0.3–1.2)
Total Protein: 6 g/dL — ABNORMAL LOW (ref 6.5–8.1)

## 2020-03-03 MED ORDER — HEPARIN SOD (PORK) LOCK FLUSH 100 UNIT/ML IV SOLN
500.0000 [IU] | Freq: Once | INTRAVENOUS | Status: AC | PRN
  Administered 2020-03-03: 500 [IU]
  Filled 2020-03-03: qty 5

## 2020-03-03 MED ORDER — LORAZEPAM 0.5 MG PO TABS: 0.5000 mg | ORAL_TABLET | Freq: Every evening | ORAL | 1 refills | Status: DC | PRN

## 2020-03-03 MED ORDER — SODIUM CHLORIDE 0.9 % IV SOLN
Freq: Once | INTRAVENOUS | Status: AC
  Filled 2020-03-03: qty 250

## 2020-03-03 MED ORDER — SODIUM CHLORIDE 0.9 % IV SOLN
10.0000 mg | Freq: Once | INTRAVENOUS | Status: AC
  Administered 2020-03-03: 10 mg via INTRAVENOUS
  Filled 2020-03-03: qty 10

## 2020-03-03 MED ORDER — OXYCODONE HCL 5 MG PO TABS
ORAL_TABLET | ORAL | 0 refills | Status: DC
Start: 2020-03-03 — End: 2020-03-31

## 2020-03-03 MED ORDER — PALONOSETRON HCL INJECTION 0.25 MG/5ML
0.2500 mg | Freq: Once | INTRAVENOUS | Status: AC
  Administered 2020-03-03: 0.25 mg via INTRAVENOUS

## 2020-03-03 MED ORDER — SODIUM CHLORIDE 0.9% FLUSH
10.0000 mL | Freq: Once | INTRAVENOUS | Status: AC
  Administered 2020-03-03: 10 mL
  Filled 2020-03-03: qty 10

## 2020-03-03 MED ORDER — SODIUM CHLORIDE 0.9 % IV SOLN
1000.0000 mg/m2 | Freq: Once | INTRAVENOUS | Status: AC
  Administered 2020-03-03: 1710 mg via INTRAVENOUS
  Filled 2020-03-03: qty 44.97

## 2020-03-03 MED ORDER — SODIUM CHLORIDE 0.9% FLUSH
10.0000 mL | INTRAVENOUS | Status: DC | PRN
  Administered 2020-03-03: 10 mL
  Filled 2020-03-03: qty 10

## 2020-03-03 MED ORDER — PALONOSETRON HCL INJECTION 0.25 MG/5ML
INTRAVENOUS | Status: AC
  Filled 2020-03-03: qty 5

## 2020-03-03 MED ORDER — PACLITAXEL PROTEIN-BOUND CHEMO INJECTION 100 MG
200.0000 mg | Freq: Once | INTRAVENOUS | Status: AC
Start: 2020-03-03 — End: 2020-03-03
  Administered 2020-03-03: 200 mg via INTRAVENOUS
  Filled 2020-03-03: qty 40

## 2020-03-03 NOTE — Patient Instructions (Signed)

## 2020-03-03 NOTE — Patient Instructions (Signed)
[facility] Discharge Instructions for Patients Receiving Chemotherapy  Today you received the following chemotherapy agents abraxane, gemzar  To help prevent nausea and vomiting after your treatment, we encourage you to take your nausea medication as directed.   If you develop nausea and vomiting that is not controlled by your nausea medication, call the clinic.   BELOW ARE SYMPTOMS THAT SHOULD BE REPORTED IMMEDIATELY:  *FEVER GREATER THAN 100.5 F  *CHILLS WITH OR WITHOUT FEVER  NAUSEA AND VOMITING THAT IS NOT CONTROLLED WITH YOUR NAUSEA MEDICATION  *UNUSUAL SHORTNESS OF BREATH  *UNUSUAL BRUISING OR BLEEDING  TENDERNESS IN MOUTH AND THROAT WITH OR WITHOUT PRESENCE OF ULCERS  *URINARY PROBLEMS  *BOWEL PROBLEMS  UNUSUAL RASH Items with * indicate a potential emergency and should be followed up as soon as possible.  Feel free to call the clinic should you have any questions or concerns. The clinic phone number is [phone].  Please show the CHEMO ALERT CARD at check-in to the Emergency Department and triage nurse.   

## 2020-03-03 NOTE — Progress Notes (Signed)
[place] OFFICE PROGRESS NOTE   Diagnosis: Pancreas cancer  INTERVAL HISTORY:   Rebecca Weaver completed another cycle of gemcitabine/Abraxane on 02/20/2020.  No fever, rash, or nausea.  She continues to have upper abdominal pain, but this has improved.  She is taking 3-4 oxycodone tablets per day.  She has noted increased "gas "discomfort and constipation.  She increased Senokot to 2 tablets daily and the constipation improved.  She has tingling in the fingers, no numbness. She complains of insomnia.  She cannot tolerate Benadryl. Objective:  Vital signs in last 24 hours:  Blood pressure (!) 123/100, pulse 86, temperature (!) 96.4 F (35.8 C), temperature source Tympanic, resp. rate 18, height 5\' 7"  (1.702 m), weight 126 lb 6.4 oz (57.3 kg), SpO2 96 %.    HEENT: No thrush or ulcers Resp: Distant breath sounds, no respiratory distress Cardio: Regular rate and rhythm GI: Tender in the mid upper abdomen, no mass, no hepatosplenomegaly Vascular: 1+ edema at left lower leg and ankle    Portacath/PICC-without erythema  Lab Results:  Lab Results  Component Value Date   WBC 5.7 03/03/2020   HGB 13.0 03/03/2020   HCT 36.8 03/03/2020   MCV 96.3 03/03/2020   PLT 126 (L) 03/03/2020   NEUTROABS 3.3 03/03/2020    CMP  Lab Results  Component Value Date   NA 136 02/20/2020   K 3.7 02/20/2020   CL 99 02/20/2020   CO2 29 02/20/2020   GLUCOSE 125 (H) 02/20/2020   BUN 9 02/20/2020   CREATININE 0.90 02/20/2020   CALCIUM 9.1 02/20/2020   PROT 6.1 (L) 02/20/2020   ALBUMIN 3.3 (L) 02/20/2020   AST 17 02/20/2020   ALT 17 02/20/2020   ALKPHOS 70 02/20/2020   BILITOT 0.3 02/20/2020   GFRNONAA >60 02/20/2020   GFRAA 97 01/26/2020    Medications: I have reviewed the patient's current medications.   Assessment/Plan: 1. Pancreas cancer, adenocarcinoma the pancreas body, status post a CT-guided biopsy 01/09/2020  CTs 12/29/2019-pancreas body mass adjacent to the  SMA, 11 mm right upper lobe nodule and 6 mm left upper lobe nodule as well as other tiny lung nodules new compared to a CT from 11/21/2017.  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   Abdomen/back pain secondary to #1 3.   Anorexia/weight loss secondary to #1 4.   Atrial flutter 5.   COPD 6.   Port-A-Cath placement 01/20/2020      Disposition: Rebecca Weaver has completed two treatments with gemcitabine and Abraxane.  She has tolerated the chemotherapy well.  She did not have nausea following cycle two.  The abdominal pain has improved.  I encouraged her to continue Senokot and add MiraLAX for constipation.  I encouraged her to increase the use of nutrition supplements.  She will return  for an office visit and chemotherapy in 2 weeks.  She will try lorazepam for insomnia.  Betsy Coder, MD  03/03/2020  12:13 PM

## 2020-03-03 NOTE — Progress Notes (Signed)
Re-collected CMP due to previous tube hemalized.

## 2020-03-04 ENCOUNTER — Telehealth: Payer: Self-pay | Admitting: Oncology

## 2020-03-04 NOTE — Telephone Encounter (Signed)
Scheduled appointments per 12/22 los. Spoke to patient who is aware of appointments dates and times.  

## 2020-03-14 ENCOUNTER — Other Ambulatory Visit: Payer: Self-pay | Admitting: Oncology

## 2020-03-16 ENCOUNTER — Encounter: Payer: Self-pay | Admitting: Genetic Counselor

## 2020-03-16 ENCOUNTER — Ambulatory Visit: Payer: Medicare Other

## 2020-03-16 ENCOUNTER — Telehealth: Payer: Self-pay | Admitting: Genetic Counselor

## 2020-03-16 ENCOUNTER — Ambulatory Visit: Payer: Self-pay | Admitting: Genetic Counselor

## 2020-03-16 DIAGNOSIS — Z1379 Encounter for other screening for genetic and chromosomal anomalies: Secondary | ICD-10-CM | POA: Insufficient documentation

## 2020-03-16 NOTE — Progress Notes (Signed)
[place]   Telephone:([phone]) [phone] [phone]   Clinic Follow up Note   Patient Care Team: Nickola Major, MD as PCP - General (Family Medicine) O'Neal, Cassie Freer, MD as PCP - Cardiology (Cardiology) Jonnie Finner, RN as Oncology Nurse Navigator Ladell Pier, MD as Consulting Physician (Oncology) 03/18/2020  CHIEF COMPLAINT: Follow up pancreas cancer   CURRENT THERAPY: Gemcitabine and Abraxane days 1 and 15 q28 days  INTERVAL HISTORY: [name] returns for follow up and treatment as scheduled. She received 3rd dose of gem/abraxane on 03/03/20.  She has low energy and appetite, spends more time in bed for 6-7 days, taking a little longer to recover each time.  She is able to be more active when she recovers, p.o. intake is adequate and she uses supplements.  Continues to have gas and bloating pain after meals that is managed by laying on her stomach and Gas-X medication.  Constipation has much improved on Senokot and MiraLAX.  Last night she developed a deep burning abdominal pain that is unlike her baseline abdominal pain which has improved on treatment.  Denies nausea/vomiting, burping/belching, GI bleeding.  Denies any fever, chills, cough, chest pain, dyspnea.  Neuropathy is stable.   MEDICAL HISTORY:  Past Medical History:  Diagnosis Date  . Anemia   . Arrhythmia    Atrial fibrillation s/p TEE/DCCV 01/29/2020  . Arthritis   . COPD (chronic obstructive pulmonary disease) ([place])   . Coronary artery calcification   . Family history of breast cancer   . Family history of colon cancer   . GERD (gastroesophageal reflux disease)   . Hyperlipidemia   . Hypertension   . Lung nodules   . Osteopenia   . Pseudotumor cerebri     SURGICAL HISTORY: Past Surgical History:  Procedure Laterality Date  . CARDIOVERSION N/A 01/29/2020   Procedure: CARDIOVERSION;  Surgeon: Buford Dresser, MD;  Location: [facility] ENDOSCOPY;  Service:  Cardiovascular;  Laterality: N/A;  . CHOLECYSTECTOMY    . ESOPHAGOGASTRODUODENOSCOPY (EGD) WITH PROPOFOL N/A 01/02/2020   Procedure: ESOPHAGOGASTRODUODENOSCOPY (EGD) WITH PROPOFOL;  Surgeon: [name], MD;  Location: [facility] ENDOSCOPY;  Service: Endoscopy;  Laterality: N/A;  . FINE NEEDLE ASPIRATION N/A 01/02/2020   Procedure: FINE NEEDLE ASPIRATION (FNA) LINEAR;  Surgeon: [name], MD;  Location: [facility] ENDOSCOPY;  Service: Endoscopy;  Laterality: N/A;  . IR IMAGING GUIDED PORT INSERTION  01/20/2020  . TEE WITHOUT CARDIOVERSION N/A 01/29/2020   Procedure: TRANSESOPHAGEAL ECHOCARDIOGRAM (TEE);  Surgeon: Buford Dresser, MD;  Location: [place];  Service: Cardiovascular;  Laterality: N/A;  . TUBAL LIGATION    . UPPER ESOPHAGEAL ENDOSCOPIC ULTRASOUND (EUS) N/A 01/02/2020   Procedure: UPPER ESOPHAGEAL ENDOSCOPIC ULTRASOUND (EUS);  Surgeon: [name], MD;  Location: [facility] ENDOSCOPY;  Service: Endoscopy;  Laterality: N/A;  . VARICOSE VEIN SURGERY     multiple    I have reviewed the social history and family history with the patient and they are unchanged from previous note.  ALLERGIES:  is allergic to atorvastatin, ciprofloxacin, and sulfa antibiotics.  MEDICATIONS:  Current Outpatient Medications  Medication Sig Dispense Refill  . acetaminophen (TYLENOL) 500 MG tablet Take 1,000 mg by mouth every 6 (six) hours as needed for moderate pain or headache.    Marland Kitchen apixaban (ELIQUIS) 5 MG TABS tablet Take 1 tablet (5 mg total) by mouth 2 (two) times daily. Do not start this until you have the pancreatic biopsy done by GI. 180 tablet 1  . Cyanocobalamin (B-12 COMPLIANCE INJECTION IJ)  Inject 1 Dose as directed every 30 (thirty) days.    Marland Kitchen diltiazem (CARDIZEM CD) 120 MG 24 hr capsule Take 1 capsule (120 mg total) by mouth daily. 90 capsule 3  . ezetimibe (ZETIA) 10 MG tablet Take 10 mg by mouth daily.    . famotidine (PEPCID) 20 MG tablet Take 20 mg by mouth daily.    . fluticasone (FLONASE)  50 MCG/ACT nasal spray Place 1 spray into both nostrils 2 (two) times daily.     . Fluticasone-Salmeterol (ADVAIR) 250-50 MCG/DOSE AEPB Inhale 1 puff into the lungs 2 (two) times daily.    . furosemide (LASIX) 20 MG tablet Take 1 tablet (20 mg total) by mouth daily. 90 tablet 3  . lidocaine-prilocaine (EMLA) cream Apply 1 application topically as directed. Apply 1 hour prior to port access and cover with plastic wrap 30 g 2  . loratadine (CLARITIN) 10 MG tablet Take 10 mg by mouth daily.    Marland Kitchen LORazepam (ATIVAN) 0.5 MG tablet Take 1 tablet (0.5 mg total) by mouth at bedtime as needed for sleep. 30 tablet 1  . Naphazoline-Pheniramine (VISINE-A OP) Place 1 drop into both eyes daily.    . ondansetron (ZOFRAN) 8 MG tablet Take 1 tablet (8 mg total) by mouth every 8 (eight) hours as needed for nausea or vomiting. (Patient not taking: Reported on 03/03/2020) 30 tablet 1  . oxyCODONE (OXY IR/ROXICODONE) 5 MG immediate release tablet 1 to 2 PO Q 4 hours prn pain 100 tablet 0  . prochlorperazine (COMPAZINE) 10 MG tablet Take 1 tablet (10 mg total) by mouth every 6 (six) hours as needed for nausea. (Patient not taking: No sig reported) 60 tablet 1   No current facility-administered medications for this visit.   Facility-Administered Medications Ordered in Other Visits  Medication Dose Route Frequency Provider Last Rate Last Admin  . 0.9 %  sodium chloride infusion   Intravenous Once Ladell Pier, MD      . dexamethasone (DECADRON) 10 mg in sodium chloride 0.9 % 50 mL IVPB  10 mg Intravenous Once Ladell Pier, MD      . gemcitabine (GEMZAR) 1,710 mg in sodium chloride 0.9 % 250 mL chemo infusion  1,000 mg/m2 (Treatment Plan Recorded) Intravenous Once Ladell Pier, MD      . heparin lock flush 100 unit/mL  500 Units Intracatheter Once PRN Ladell Pier, MD      . PACLitaxel-protein bound (ABRAXANE) chemo infusion 225 mg  125 mg/m2 (Treatment Plan Recorded) Intravenous Once Ladell Pier, MD       . palonosetron (ALOXI) injection 0.25 mg  0.25 mg Intravenous Once Betsy Coder B, MD      . sodium chloride flush (NS) 0.9 % injection 10 mL  10 mL Intracatheter PRN Ladell Pier, MD        PHYSICAL EXAMINATION: ECOG PERFORMANCE STATUS: 1 - Symptomatic but completely ambulatory  Vitals:   03/18/20 0944  BP: 122/75  Pulse: 89  Resp: 20  Temp: 97.8 F (36.6 C)  SpO2: 96%   Filed Weights   03/18/20 0944  Weight: 127 lb 14.4 oz (58 kg)    GENERAL:alert, no distress and comfortable SKIN: No rash EYES: Small stye on the left lower lid NECK: Without mass LUNGS: Clear with normal breathing effort HEART: regular rate & rhythm, left > right lower extremity edema ABDOMEN: abdomen soft, non-tender and normal bowel sounds NEURO: alert & oriented x 3 with fluent speech, no focal motor deficits  PAC without erythema    LABORATORY DATA:  I have reviewed the data as listed CBC Latest Ref Rng & Units 03/18/2020 03/03/2020 02/20/2020  WBC 4.0 - 10.5 K/uL 6.6 5.7 5.5  Hemoglobin 12.0 - 15.0 g/dL 13.1 13.0 13.5  Hematocrit 36.0 - 46.0 % 38.5 36.8 39.0  Platelets 150 - 400 K/uL 244 126(L) 212     CMP Latest Ref Rng & Units 03/18/2020 03/03/2020 02/20/2020  Glucose 70 - 99 mg/dL 149(H) 90 125(H)  BUN 8 - 23 mg/dL 8 6(L) 9  Creatinine 0.44 - 1.00 mg/dL 0.71 0.72 0.90  Sodium 135 - 145 mmol/L 140 139 136  Potassium 3.5 - 5.1 mmol/L 3.5 3.3(L) 3.7  Chloride 98 - 111 mmol/L 105 100 99  CO2 22 - 32 mmol/L 27 28 29   Calcium 8.9 - 10.3 mg/dL 9.1 9.0 9.1  Total Protein 6.5 - 8.1 g/dL 6.1(L) 6.0(L) 6.1(L)  Total Bilirubin 0.3 - 1.2 mg/dL 0.5 0.4 0.3  Alkaline Phos 38 - 126 U/L 82 78 70  AST 15 - 41 U/L 13(L) 17 17  ALT 0 - 44 U/L 11 21 17       RADIOGRAPHIC STUDIES: I have personally reviewed the radiological images as listed and agreed with the findings in the report. No results found.   ASSESSMENT & PLAN:   1. Pancreas cancer, adenocarcinoma the pancreas body, status post a  CT-guided biopsy 01/09/2020 ? CTs 12/29/2019-pancreas body mass adjacent to the SMA, 11 mm right upper lobe nodule and 6 mm left upper lobe nodule as well as other tiny lung nodules new compared to a CT from 11/21/2017.  Upper normal right hilar and subcarinal nodes ? 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 EUS 01/02/2020-irregular pancreas body mass with poorly defined borders, lack of portal vein invasion, FNA not possible, no evidence of invasion into the portal vein ? PET 01/21/2020-hypermetabolic pancreas body/neck mass, hypermetabolic 1 cm right upper lobe nodule, other small pulmonary nodules below PET resolution ? Cycle 1 gemcitabine/Abraxane 02/06/2020 ? Cycle 2 gemcitabine/Abraxane 02/20/2020, Aloxi/Decadron added to the antiemetic regimen ? Cycle 3 gemcitabine/Abraxane 03/03/2020 ? Cycle 4 gemcitabine/abraxane 03/18/2020  2.   Abdomen/back pain secondary to #1 3.   Anorexia/weight loss secondary to #1 4.   Atrial flutter 5.   COPD 6.   Port-A-Cath placement 01/20/2020 7.  Constipation, secondary to #1, managed with Senokot and MiraLAX 8. Stye of left eye 03/18/2020 9. Burning abdominal pain 03/18/20  Disposition:  Ms. Dabel appears stable.  She completed 3 cycles of gemcitabine and Abraxane. She tolerates treatment well with mild fatigue, low appetite, and neuropathy.  The abdominal pain has improved on treatment. She is able to recover well after a week and function well.   She developed a new type of burning pain in the abdomen, exam is benign.  I recommend to try Tums as needed as this could represent gastrointestinal reflux.  She will let me know if symptoms worsen or fail to improve.   She will use warm compresses 2-3 times daily for  small stye.   Labs reviewed, adequate to proceed with cycle 4 gemcitabine and Abraxane today as planned.  She will return for office visit and cycle 5 in 2 weeks.  The plan was  reviewed with Dr. Truett Perna.  All questions were answered. The patient knows to call the clinic with any problems, questions or concerns. No barriers to learning were detected.     Pollyann Samples, NP 03/18/20

## 2020-03-16 NOTE — Progress Notes (Signed)
Nutrition Follow-up:  Patient with pancreatic cancer.  Patient receiving abraxane/gemcitabine.  Spoke with patient over the phone for nutrition follow-up.  Patient reports that appetite maybe a little bit better.  Had issues with constipation that has resolved.  Taking miralax and senokot.  Has tried oral nutrition supplements (ensure complete, Molli Posey and orgain) and able to tolerate them about 1 per day. Adds small amount to fruit smoothie and sips on it during the day.  Eating small meals during the day.  Did not like the vite ramen noodles.  Now able to tolerate peanut butter and eating it on toast.  Having a taste for salt and salty foods.      Medications: reviewed  Labs: reviewed  Anthropometrics:   Weight 128 lb 6.4 oz on 12/20 decreased from 133 lb (11/26)   NUTRITION DIAGNOSIS: Inadequate oral intake continues    INTERVENTION:  Patient to continue small frequent meals Continue oral nutrition supplements as able     MONITORING, EVALUATION, GOAL: weight trends, intake   NEXT VISIT: Jan 19 during infusion  Sissi Padia B. Freida Busman, RD, LDN Registered Dietitian [phone] (mobile)

## 2020-03-16 NOTE — Progress Notes (Signed)
HPI:  Rebecca Weaver was previously seen in the [place] clinic due to a personal and family history of cancer and concerns regarding a hereditary predisposition to cancer. Please refer to our prior cancer genetics clinic note for more information regarding our discussion, assessment and recommendations, at the time. Rebecca Weaver recent genetic test results were disclosed to her, as were recommendations warranted by these results. These results and recommendations are discussed in more detail below.  CANCER HISTORY:  Oncology History  Pancreatic adenocarcinoma ([place])  01/28/2020 Initial Diagnosis   Pancreatic adenocarcinoma ([place])   02/06/2020 -  Chemotherapy   The patient had palonosetron (ALOXI) injection 0.25 mg, 0.25 mg, Intravenous,  Once, 2 of 4 cycles Administration: 0.25 mg (02/20/2020), 0.25 mg (03/03/2020) PACLitaxel-protein bound (ABRAXANE) chemo infusion 200 mg, 120 mg/m2 = 225 mg, Intravenous,  Once, 2 of 4 cycles Administration: 200 mg (02/06/2020), 200 mg (02/20/2020), 200 mg (03/03/2020) gemcitabine (GEMZAR) 1,710 mg in sodium chloride 0.9 % 250 mL chemo infusion, 1,000 mg/m2 = 1,710 mg, Intravenous,  Once, 2 of 4 cycles Administration: 1,710 mg (02/06/2020), 1,710 mg (02/20/2020), 1,710 mg (03/03/2020)  for chemotherapy treatment.    03/15/2020 Genetic Testing   Negative genetic testing on the Pancreatic cancer/pancreatitis/preliminary evidence pancreatic cancer panel. ALK c.4406C>T (p.Pro1469Leu) VUS identified. The Multi-Gene Panel offered by [facility] includes sequencing and/or deletion duplication testing of the following 91 genes: AIP, ALK, APC*, ATM*, AXIN2, BAP1, BARD1, BLM, BMPR1A, BRCA1, BRCA2, BRIP1, CASR, CDC73, CDH1, CDK4, CDKN1B, CDKN1C, CDKN2A (p14ARF), CDKN2A (p16INK4a), CEBPA, CFTR*, CHEK2, CPA1, CTNNA1, CTRC, DICER1*, DIS3L2, EGFR, EPCAM*, FANCC, FH*, FLCN, GATA2, GPC3*, GREM1*, HOXB13, HRAS, KIT, MAX*, MEN1*, MET*, MITF*, MLH1*, MSH2*, MSH3*, MSH6*,  MUTYH, NBN, NF1*, NF2, NTHL1, PALB2,  PALLD, PDGFRA, PHOX2B*, PMS2*, POLD1*, POLE, POT1, PRKAR1A, PRSS1*, PTCH1, PTEN*, RAD50, RAD51C, RAD51D, RB1*, RECQL4*, RET, RUNX1, SDHA*, SDHAF2, SDHB, SDHC*, SDHD, SMAD4, SMARCA4, SMARCB1, SMARCE1, SPINK1, STK11, SUFU, TERC, TERT, TMEM127, TP53, TSC1*, TSC2, VHL, WRN*, WT1.  The report date is March 15, 2020.     FAMILY HISTORY:  We obtained a detailed, 4-generation family history.  Significant diagnoses are listed below: Family History  Problem Relation Age of Onset  . Varicose Veins Father   . Heart disease Father   . Varicose Veins Sister     The patient has two daughters, one who had breast cancer at 13 and is a known BRCA mutation carrier.  It is unclear which BRCA gene the mutation is in.  The patient has two brothers and three sisters who are cancer free.  Both parents are deceased.  The patient's father died at 56 from heart issues.  He had three sisters, two had colon cancer, one under age 74 and the other over age 56.  The third is living and an unknown cancer.  There is no other cancer known on this side of the family.  The patient's mother died of COPD at 46.  She had two brothers who were cancer free.  The maternal grandparents were not reported to have cancer.  Rebecca Weaver is aware of previous family history of genetic testing for hereditary cancer risks. Patient's maternal ancestors are of [place] descent, and paternal ancestors are of [place] descent. There is reported Ashkenazi Jewish ancestry. There is no known consanguinity.  GENETIC TEST RESULTS: Genetic testing reported out on March 15, 2020 through the Pancreatic cancer/pancreatitis/preliminary evidence pancreatic cancer panel found no pathogenic mutations. The Custom Gene Panel offered by [facility] includes sequencing and/or deletion duplication testing of the following  91 genes: AIP, ALK, APC*, ATM*, AXIN2, BAP1, BARD1, BLM, BMPR1A, BRCA1, BRCA2, BRIP1, CASR, CDC73,CDH1, CDK4,  CDKN1B, CDKN1C, CDKN2A (p14ARF), CDKN2A (p16INK4a), CEBPA, CFTR*, CHEK2, CPA1, CTNNA1, CTRC, DICER1*, DIS3L2, EGFR, EPCAM*, FANCC, FH*, FLCN, GATA2, GPC3*, GREM1*, HOXB13, HRAS, KIT, MAX*, MEN1*, MET*, MITF*, MLH1*, MSH2*, MSH3*, MSH6*, MUTYH, NBN, NF1*, NF2, NTHL1, PALB2, PALLD, PDGFRA, PHOX2B*, PMS2*, POLD1*, POLE, POT1, PRKAR1A, PRSS1*, PTCH1, PTEN*, RAD50, RAD51C, RAD51D, RB1*, RECQL4*, RET, RUNX1, SDHA*, SDHAF2, SDHB, SDHC*, SDHD, SMAD4, SMARCA4, SMARCB1, SMARCE1, SPINK1, STK11, SUFU, TERC, TERT, TMEM127, TP53, TSC1*, TSC2, VHL, WRN*, and WT1. The test report has been scanned into EPIC and is located under the Molecular Pathology section of the Results Review tab.  A portion of the result report is included below for reference.     We discussed with Rebecca Weaver that because current genetic testing is not perfect, it is possible there may be a gene mutation in one of these genes that current testing cannot detect, but that chance is small.  We also discussed, that there could be another gene that has not yet been discovered, or that we have not yet tested, that is responsible for the cancer diagnoses in the family. It is also possible there is a hereditary cause for the cancer in the family that Rebecca Weaver did not inherit and therefore was not identified in her testing.  Therefore, it is important to remain in touch with cancer genetics in the future so that we can continue to offer Rebecca Weaver the most up to date genetic testing.   Genetic testing did identify a variant of uncertain significance (VUS) was identified in the ALK gene called c.4406C>T (p.Pro1469Leu).  At this time, it is unknown if this variant is associated with increased cancer risk or if this is a normal finding, but most variants such as this get reclassified to being inconsequential. It should not be used to make medical management decisions. With time, we suspect the lab will determine the significance of this variant, if any.  If we do learn more about it, we will try to contact Rebecca Weaver to discuss it further. However, it is important to stay in touch with [place] periodically and keep the address and phone number up to date.  The laboratory did offer for this variant to be part of their family variant study.  This means that up to two additional individuals could be tested for the ALK variant in order to track it through the family.  ADDITIONAL GENETIC TESTING: We discussed with Rebecca Weaver that her genetic testing was fairly extensive.  If there are genes identified to increase cancer risk that can be analyzed in the future, we would be happy to discuss and coordinate this testing at that time.    CANCER SCREENING RECOMMENDATIONS: Rebecca Weaver test result is considered negative (normal).  This means that we have not identified a hereditary cause for her personal and family history of cancer at this time. Most cancers happen by chance and this negative test suggests that her cancer may fall into this category.    While reassuring, this does not definitively rule out a hereditary predisposition to cancer. It is still possible that there could be genetic mutations that are undetectable by current technology. There could be genetic mutations in genes that have not been tested or identified to increase cancer risk.  Therefore, it is recommended she continue to follow the cancer management and screening guidelines provided by her oncology and primary healthcare provider.   An  individual's cancer risk and medical management are not determined by genetic test results alone. Overall cancer risk assessment incorporates additional factors, including personal medical history, family history, and any available genetic information that may result in a personalized plan for cancer prevention and surveillance  RECOMMENDATIONS FOR FAMILY MEMBERS:  Individuals in this family might be at some increased risk of developing cancer, over the  general population risk, simply due to the family history of cancer.  We recommended women in this family have a yearly mammogram beginning at age 40, or 65 years younger than the earliest onset of cancer, an annual clinical breast exam, and perform monthly breast self-exams. Women in this family should also have a gynecological exam as recommended by their primary provider. All family members should be referred for colonoscopy starting at age 42.  FOLLOW-UP: Lastly, we discussed with Rebecca Weaver that cancer genetics is a rapidly advancing field and it is possible that new genetic tests will be appropriate for her and/or her family members in the future. We encouraged her to remain in contact with cancer genetics on an annual basis so we can update her personal and family histories and let her know of advances in cancer genetics that may benefit this family.   Our contact number was provided. Rebecca Weaver questions were answered to her satisfaction, and she knows she is welcome to call us at anytime with additional questions or concerns.   Roma Kayser, [place], [facility] Licensed, Certified Genetic Counselor [email or web address]

## 2020-03-16 NOTE — Telephone Encounter (Signed)
LM on VM that results are back and to please call. 

## 2020-03-16 NOTE — Telephone Encounter (Signed)
Revealed negative genetic testing.  Discussed that we do not know why she has pancreatic cancer or why there is cancer in the family. It could be due to a different gene that we are not testing, or maybe our current technology may not be able to pick something up.  It will be important for her to keep in contact with genetics to keep up with whether additional testing may be needed.  

## 2020-03-18 ENCOUNTER — Other Ambulatory Visit: Payer: Self-pay

## 2020-03-18 ENCOUNTER — Encounter: Payer: Self-pay | Admitting: Nurse Practitioner

## 2020-03-18 ENCOUNTER — Inpatient Hospital Stay (HOSPITAL_BASED_OUTPATIENT_CLINIC_OR_DEPARTMENT_OTHER): Payer: Medicare Other | Admitting: Nurse Practitioner

## 2020-03-18 ENCOUNTER — Inpatient Hospital Stay: Payer: Medicare Other

## 2020-03-18 ENCOUNTER — Inpatient Hospital Stay: Payer: Medicare Other | Attending: Oncology

## 2020-03-18 VITALS — BP 122/75 | HR 89 | Temp 97.8°F | Resp 20 | Ht 67.0 in | Wt 127.9 lb

## 2020-03-18 DIAGNOSIS — C251 Malignant neoplasm of body of pancreas: Secondary | ICD-10-CM | POA: Insufficient documentation

## 2020-03-18 DIAGNOSIS — Z5111 Encounter for antineoplastic chemotherapy: Secondary | ICD-10-CM | POA: Insufficient documentation

## 2020-03-18 DIAGNOSIS — Z79899 Other long term (current) drug therapy: Secondary | ICD-10-CM | POA: Diagnosis not present

## 2020-03-18 DIAGNOSIS — C259 Malignant neoplasm of pancreas, unspecified: Secondary | ICD-10-CM

## 2020-03-18 DIAGNOSIS — Z95828 Presence of other vascular implants and grafts: Secondary | ICD-10-CM

## 2020-03-18 LAB — CBC WITH DIFFERENTIAL (CANCER CENTER ONLY)
Abs Immature Granulocytes: 0.02 10*3/uL (ref 0.00–0.07)
Basophils Absolute: 0 10*3/uL (ref 0.0–0.1)
Basophils Relative: 1 %
Eosinophils Absolute: 0.1 10*3/uL (ref 0.0–0.5)
Eosinophils Relative: 1 %
HCT: 38.5 % (ref 36.0–46.0)
Hemoglobin: 13.1 g/dL (ref 12.0–15.0)
Immature Granulocytes: 0 %
Lymphocytes Relative: 15 %
Lymphs Abs: 1 10*3/uL (ref 0.7–4.0)
MCH: 32.8 pg (ref 26.0–34.0)
MCHC: 34 g/dL (ref 30.0–36.0)
MCV: 96.5 fL (ref 80.0–100.0)
Monocytes Absolute: 0.5 10*3/uL (ref 0.1–1.0)
Monocytes Relative: 8 %
Neutro Abs: 4.9 10*3/uL (ref 1.7–7.7)
Neutrophils Relative %: 75 %
Platelet Count: 244 10*3/uL (ref 150–400)
RBC: 3.99 MIL/uL (ref 3.87–5.11)
RDW: 13.7 % (ref 11.5–15.5)
WBC Count: 6.6 10*3/uL (ref 4.0–10.5)
nRBC: 0 % (ref 0.0–0.2)

## 2020-03-18 LAB — CMP (CANCER CENTER ONLY)
ALT: 11 U/L (ref 0–44)
AST: 13 U/L — ABNORMAL LOW (ref 15–41)
Albumin: 3.2 g/dL — ABNORMAL LOW (ref 3.5–5.0)
Alkaline Phosphatase: 82 U/L (ref 38–126)
Anion gap: 8 (ref 5–15)
BUN: 8 mg/dL (ref 8–23)
CO2: 27 mmol/L (ref 22–32)
Calcium: 9.1 mg/dL (ref 8.9–10.3)
Chloride: 105 mmol/L (ref 98–111)
Creatinine: 0.71 mg/dL (ref 0.44–1.00)
GFR, Estimated: >60 mL/min (ref >60)
Glucose, Bld: 149 mg/dL — ABNORMAL HIGH (ref 70–99)
Potassium: 3.5 mmol/L (ref 3.5–5.1)
Sodium: 140 mmol/L (ref 135–145)
Total Bilirubin: 0.5 mg/dL (ref 0.3–1.2)
Total Protein: 6.1 g/dL — ABNORMAL LOW (ref 6.5–8.1)

## 2020-03-18 MED ORDER — PALONOSETRON HCL INJECTION 0.25 MG/5ML
INTRAVENOUS | Status: AC
  Filled 2020-03-18: qty 5

## 2020-03-18 MED ORDER — PACLITAXEL PROTEIN-BOUND CHEMO INJECTION 100 MG
200.0000 mg | Freq: Once | INTRAVENOUS | Status: AC
Start: 2020-03-18 — End: 2020-03-18
  Administered 2020-03-18: 200 mg via INTRAVENOUS
  Filled 2020-03-18: qty 40

## 2020-03-18 MED ORDER — SODIUM CHLORIDE 0.9 % IV SOLN
10.0000 mg | Freq: Once | INTRAVENOUS | Status: AC
  Administered 2020-03-18: 10 mg via INTRAVENOUS
  Filled 2020-03-18: qty 10

## 2020-03-18 MED ORDER — SODIUM CHLORIDE 0.9 % IV SOLN
Freq: Once | INTRAVENOUS | Status: AC
  Filled 2020-03-18: qty 250

## 2020-03-18 MED ORDER — HEPARIN SOD (PORK) LOCK FLUSH 100 UNIT/ML IV SOLN
500.0000 [IU] | Freq: Once | INTRAVENOUS | Status: AC | PRN
Start: 2020-03-18 — End: 2020-03-18
  Administered 2020-03-18: 500 [IU]
  Filled 2020-03-18: qty 5

## 2020-03-18 MED ORDER — SODIUM CHLORIDE 0.9% FLUSH
10.0000 mL | Freq: Once | INTRAVENOUS | Status: AC
  Administered 2020-03-18: 10 mL
  Filled 2020-03-18: qty 10

## 2020-03-18 MED ORDER — SODIUM CHLORIDE 0.9 % IV SOLN
1000.0000 mg/m2 | Freq: Once | INTRAVENOUS | Status: AC
  Administered 2020-03-18: 1710 mg via INTRAVENOUS
  Filled 2020-03-18: qty 44.97

## 2020-03-18 MED ORDER — PALONOSETRON HCL INJECTION 0.25 MG/5ML
0.2500 mg | Freq: Once | INTRAVENOUS | Status: AC
  Administered 2020-03-18: 0.25 mg via INTRAVENOUS

## 2020-03-18 MED ORDER — SODIUM CHLORIDE 0.9% FLUSH
10.0000 mL | INTRAVENOUS | Status: DC | PRN
  Administered 2020-03-18: 10 mL
  Filled 2020-03-18: qty 10

## 2020-03-18 NOTE — Patient Instructions (Signed)
[facility] Discharge Instructions for Patients Receiving Chemotherapy  Today you received the following chemotherapy agents: albumin paclitaxel and gemcitabine.  To help prevent nausea and vomiting after your treatment, we encourage you to take your nausea medication as directed.   If you develop nausea and vomiting that is not controlled by your nausea medication, call the clinic.   BELOW ARE SYMPTOMS THAT SHOULD BE REPORTED IMMEDIATELY:  *FEVER GREATER THAN 100.5 F  *CHILLS WITH OR WITHOUT FEVER  NAUSEA AND VOMITING THAT IS NOT CONTROLLED WITH YOUR NAUSEA MEDICATION  *UNUSUAL SHORTNESS OF BREATH  *UNUSUAL BRUISING OR BLEEDING  TENDERNESS IN MOUTH AND THROAT WITH OR WITHOUT PRESENCE OF ULCERS  *URINARY PROBLEMS  *BOWEL PROBLEMS  UNUSUAL RASH Items with * indicate a potential emergency and should be followed up as soon as possible.  Feel free to call the clinic should you have any questions or concerns. The clinic phone number is [phone].  Please show the CHEMO ALERT CARD at check-in to the Emergency Department and triage nurse.

## 2020-03-18 NOTE — Patient Instructions (Signed)

## 2020-03-19 ENCOUNTER — Telehealth: Payer: Self-pay | Admitting: Nurse Practitioner

## 2020-03-19 NOTE — Telephone Encounter (Signed)
Scheduled appointments per 1/6 los. Will have updated calendar printed for patient at next visit.  

## 2020-03-22 ENCOUNTER — Telehealth: Payer: Self-pay

## 2020-03-22 NOTE — Telephone Encounter (Signed)
Patient calls with complaint of severe gas pains that started Saturday after receiving treatment last Thursday.  She is drinking warm teas and warm soups, nothing cold.  She also is having a lot of fatigue.  All of these symptoms last at least 5 days after treatment.    She is seeking advice mainly for the gas pains.   [phone]

## 2020-03-22 NOTE — Telephone Encounter (Signed)
Per Dr. Benay Spice: Just take liquids po for a few days, be sure bowels are moving, and use pain med if needed. Patient notified and also encouraged to ambulate, when in bed lie on left side with right knee bent and pillow underneath to help expel gas as well. Warm heating pad on abdomen and can try Mylicon or Gas-X. She has already tried the meds. Will try other suggestions and take pain pill.

## 2020-03-29 ENCOUNTER — Other Ambulatory Visit: Payer: Self-pay | Admitting: Oncology

## 2020-03-30 MED FILL — Dexamethasone Sodium Phosphate Inj 100 MG/10ML: INTRAMUSCULAR | Qty: 1 | Status: AC

## 2020-03-31 ENCOUNTER — Inpatient Hospital Stay: Payer: Medicare Other

## 2020-03-31 ENCOUNTER — Telehealth: Payer: Self-pay | Admitting: Oncology

## 2020-03-31 ENCOUNTER — Other Ambulatory Visit: Payer: Self-pay

## 2020-03-31 ENCOUNTER — Inpatient Hospital Stay (HOSPITAL_BASED_OUTPATIENT_CLINIC_OR_DEPARTMENT_OTHER): Payer: Medicare Other | Admitting: Oncology

## 2020-03-31 ENCOUNTER — Encounter (HOSPITAL_COMMUNITY): Payer: Self-pay

## 2020-03-31 ENCOUNTER — Ambulatory Visit (HOSPITAL_COMMUNITY)
Admission: RE | Admit: 2020-03-31 | Discharge: 2020-03-31 | Disposition: A | Discharge status: Home / Self Care | Payer: Medicare Other | Source: Ambulatory Visit | Attending: Oncology | Admitting: Oncology

## 2020-03-31 ENCOUNTER — Telehealth: Payer: Self-pay

## 2020-03-31 VITALS — BP 129/77 | HR 86 | Temp 97.4°F | Resp 18 | Ht 67.0 in | Wt 128.9 lb

## 2020-03-31 DIAGNOSIS — C259 Malignant neoplasm of pancreas, unspecified: Secondary | ICD-10-CM

## 2020-03-31 DIAGNOSIS — R300 Dysuria: Secondary | ICD-10-CM

## 2020-03-31 DIAGNOSIS — Z95828 Presence of other vascular implants and grafts: Secondary | ICD-10-CM

## 2020-03-31 DIAGNOSIS — Z5111 Encounter for antineoplastic chemotherapy: Secondary | ICD-10-CM | POA: Diagnosis not present

## 2020-03-31 LAB — CBC WITH DIFFERENTIAL (CANCER CENTER ONLY)
Abs Immature Granulocytes: 0.04 10*3/uL (ref 0.00–0.07)
Basophils Absolute: 0 10*3/uL (ref 0.0–0.1)
Basophils Relative: 0 %
Eosinophils Absolute: 0.1 10*3/uL (ref 0.0–0.5)
Eosinophils Relative: 1 %
HCT: 33.8 % — ABNORMAL LOW (ref 36.0–46.0)
Hemoglobin: 11.6 g/dL — ABNORMAL LOW (ref 12.0–15.0)
Immature Granulocytes: 1 %
Lymphocytes Relative: 17 %
Lymphs Abs: 1.1 10*3/uL (ref 0.7–4.0)
MCH: 33.3 pg (ref 26.0–34.0)
MCHC: 34.3 g/dL (ref 30.0–36.0)
MCV: 97.1 fL (ref 80.0–100.0)
Monocytes Absolute: 0.7 10*3/uL (ref 0.1–1.0)
Monocytes Relative: 11 %
Neutro Abs: 4.8 10*3/uL (ref 1.7–7.7)
Neutrophils Relative %: 70 %
Platelet Count: 194 10*3/uL (ref 150–400)
RBC: 3.48 MIL/uL — ABNORMAL LOW (ref 3.87–5.11)
RDW: 15.1 % (ref 11.5–15.5)
WBC Count: 6.8 10*3/uL (ref 4.0–10.5)
nRBC: 0 % (ref 0.0–0.2)

## 2020-03-31 LAB — CMP (CANCER CENTER ONLY)
ALT: 13 U/L (ref 0–44)
AST: 15 U/L (ref 15–41)
Albumin: 3.2 g/dL — ABNORMAL LOW (ref 3.5–5.0)
Alkaline Phosphatase: 89 U/L (ref 38–126)
Anion gap: 10 (ref 5–15)
BUN: 7 mg/dL — ABNORMAL LOW (ref 8–23)
CO2: 28 mmol/L (ref 22–32)
Calcium: 9 mg/dL (ref 8.9–10.3)
Chloride: 102 mmol/L (ref 98–111)
Creatinine: 0.69 mg/dL (ref 0.44–1.00)
GFR, Estimated: >60 mL/min (ref >60)
Glucose, Bld: 150 mg/dL — ABNORMAL HIGH (ref 70–99)
Potassium: 3.6 mmol/L (ref 3.5–5.1)
Sodium: 140 mmol/L (ref 135–145)
Total Bilirubin: 0.4 mg/dL (ref 0.3–1.2)
Total Protein: 6.1 g/dL — ABNORMAL LOW (ref 6.5–8.1)

## 2020-03-31 LAB — URINALYSIS, COMPLETE (UACMP) WITH MICROSCOPIC
Bilirubin Urine: NEGATIVE
Glucose, UA: NEGATIVE mg/dL
Hgb urine dipstick: NEGATIVE
Ketones, ur: NEGATIVE mg/dL
Leukocytes,Ua: NEGATIVE
Nitrite: NEGATIVE
Protein, ur: NEGATIVE mg/dL
Specific Gravity, Urine: 1.005 (ref 1.005–1.030)
pH: 7 (ref 5.0–8.0)

## 2020-03-31 MED ORDER — SODIUM CHLORIDE 0.9% FLUSH
10.0000 mL | Freq: Once | INTRAVENOUS | Status: AC
  Administered 2020-03-31: 10 mL
  Filled 2020-03-31: qty 10

## 2020-03-31 MED ORDER — IOHEXOL 300 MG/ML  SOLN
100.0000 mL | Freq: Once | INTRAMUSCULAR | Status: AC | PRN
  Administered 2020-03-31: 100 mL via INTRAVENOUS

## 2020-03-31 MED ORDER — HEPARIN SOD (PORK) LOCK FLUSH 100 UNIT/ML IV SOLN
INTRAVENOUS | Status: AC
  Filled 2020-03-31: qty 5

## 2020-03-31 MED ORDER — OXYCODONE HCL 5 MG PO TABS: ORAL_TABLET | ORAL | 0 refills | Status: DC

## 2020-03-31 MED ORDER — LORAZEPAM 0.5 MG PO TABS: 0.5000 mg | ORAL_TABLET | Freq: Every evening | ORAL | 1 refills | Status: AC | PRN

## 2020-03-31 MED ORDER — HEPARIN SOD (PORK) LOCK FLUSH 100 UNIT/ML IV SOLN
500.0000 [IU] | Freq: Once | INTRAVENOUS | Status: AC
  Administered 2020-03-31: 500 [IU] via INTRAVENOUS

## 2020-03-31 NOTE — Progress Notes (Signed)
Rebecca Weaver   Diagnosis: Pancreas cancer  INTERVAL HISTORY:   Rebecca Weaver completed another cycle of gemcitabine/Abraxane on 03/18/2020.  No rash or neuropathy symptoms.  She reports severe "gas "discomfort for the past 2 weeks.  The pain is in the lower abdomen.  The pain is relieved partially when she takes 2 oxycodone tablets.  She reports the pain was "constant "for 7 days.  No burning with urination.  No hematuria.  Her bowels are moving.  The upper abdominal pain and back pain have improved since beginning chemotherapy.  Objective:  Vital signs in last 24 hours:  Blood pressure 129/77, pulse 86, temperature (!) 97.4 F (36.3 C), temperature source Tympanic, resp. rate 18, height 5\' 7"  (1.702 m), weight 128 lb 14.4 oz (58.5 kg), SpO2 98 %.    Resp: Distant breath sounds, no respiratory distress Cardio: Regular rate and rhythm GI: No hepatosplenomegaly, no mass, tender in the suprapubic region Vascular: Pitting edema at the left greater than right lower leg, no erythema, varicose veins of the left lower leg  Portacath/PICC-without erythema  Lab Results:  Lab Results  Component Value Date   WBC 6.8 03/31/2020   HGB 11.6 (L) 03/31/2020   HCT 33.8 (L) 03/31/2020   MCV 97.1 03/31/2020   PLT 194 03/31/2020   NEUTROABS 4.8 03/31/2020    CMP  Lab Results  Component Value Date   NA 140 03/31/2020   K 3.6 03/31/2020   CL 102 03/31/2020   CO2 28 03/31/2020   GLUCOSE 150 (H) 03/31/2020   BUN 7 (L) 03/31/2020   CREATININE 0.69 03/31/2020   CALCIUM 9.0 03/31/2020   PROT 6.1 (L) 03/31/2020   ALBUMIN 3.2 (L) 03/31/2020   AST 15 03/31/2020   ALT 13 03/31/2020   ALKPHOS 89 03/31/2020   BILITOT 0.4 03/31/2020   GFRNONAA >60 03/31/2020   GFRAA 97 01/26/2020     Medications: I have reviewed the patient's current medications.   Assessment/Plan: 1. Pancreas cancer, adenocarcinoma the pancreas body, status post a CT-guided biopsy  01/09/2020 ? CTs 12/29/2019-pancreas body mass adjacent to the SMA, 11 mm right upper lobe nodule and 6 mm left upper lobe nodule as well as other tiny lung nodules new compared to a CT from 11/21/2017.  Upper normal right hilar and subcarinal nodes ? 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 EUS 01/02/2020-irregular pancreas body mass with poorly defined borders, lack of portal vein invasion, FNA not possible, no evidence of invasion into the portal vein ? PET 01/21/2020-hypermetabolic pancreas body/neck mass, hypermetabolic 1 cm right upper lobe nodule, other small pulmonary nodules below PET resolution ? Cycle 1 gemcitabine/Abraxane 02/06/2020 ? Cycle 2 gemcitabine/Abraxane 02/20/2020, Aloxi/Decadron added to the antiemetic regimen ? Cycle 3 gemcitabine/Abraxane 03/03/2020 ? Cycle 4 gemcitabine/abraxane 03/18/2020  2.   Abdomen/back pain secondary to #1 3.   Anorexia/weight loss secondary to #1 4.   Atrial flutter 5.   COPD 6.   Port-A-Cath placement 01/20/2020 7.  Constipation, secondary to #1, managed with Senokot and MiraLAX 8. Stye of left eye 03/18/2020 9. Burning abdominal pain 03/18/20    Disposition: Rebecca Weaver has a history of locally advanced pancreas cancer.  She has completed 4 treatments with gemcitabine/Abraxane.  She  has tolerated the treatment well. She has developed severe pain in the suprapubic region.  The etiology of the pain is unclear.  She does not have symptoms to suggest a urinary tract infection.  The PET scan in November revealed a hypermetabolic area in the anterior pelvis felt to be related to a partially collapsed bladder.  She will continue oxycodone as needed for pain.  We will check a urinalysis and CT of the  abdomen/pelvis today.  She will return for an office visit tomorrow.  Rebecca Coder, MD  03/31/2020  12:23 PM

## 2020-03-31 NOTE — Telephone Encounter (Signed)
Left VM with pharmacy calling in for Ativan

## 2020-03-31 NOTE — Progress Notes (Signed)
Nutrition  RD planning to meet with patient during infusion but infusion cancelled for today. Reschedule to see patient on 2/3.  Marcela Alatorre B. Zenia Resides, [place], [place] Registered Dietitian [phone] (mobile)

## 2020-03-31 NOTE — Patient Instructions (Signed)

## 2020-03-31 NOTE — Telephone Encounter (Signed)
Scheduled appointments per 1/19 los. Spoke to patient who is aware of appointments date and times.

## 2020-04-01 ENCOUNTER — Inpatient Hospital Stay (HOSPITAL_BASED_OUTPATIENT_CLINIC_OR_DEPARTMENT_OTHER): Payer: Medicare Other | Admitting: Oncology

## 2020-04-01 ENCOUNTER — Other Ambulatory Visit: Payer: Self-pay

## 2020-04-01 ENCOUNTER — Telehealth: Payer: Self-pay

## 2020-04-01 ENCOUNTER — Inpatient Hospital Stay: Payer: Medicare Other

## 2020-04-01 VITALS — BP 129/77 | HR 94 | Temp 96.7°F | Resp 18 | Ht 67.0 in | Wt 129.0 lb

## 2020-04-01 DIAGNOSIS — C259 Malignant neoplasm of pancreas, unspecified: Secondary | ICD-10-CM

## 2020-04-01 DIAGNOSIS — Z5111 Encounter for antineoplastic chemotherapy: Secondary | ICD-10-CM | POA: Diagnosis not present

## 2020-04-01 LAB — URINE CULTURE: Culture: NO GROWTH

## 2020-04-01 MED ORDER — SODIUM CHLORIDE 0.9 % IV SOLN
Freq: Once | INTRAVENOUS | Status: AC
  Filled 2020-04-01: qty 250

## 2020-04-01 MED ORDER — HEPARIN SOD (PORK) LOCK FLUSH 100 UNIT/ML IV SOLN
500.0000 [IU] | Freq: Once | INTRAVENOUS | Status: AC | PRN
  Administered 2020-04-01: 500 [IU]
  Filled 2020-04-01: qty 5

## 2020-04-01 MED ORDER — SODIUM CHLORIDE 0.9 % IV SOLN
10.0000 mg | Freq: Once | INTRAVENOUS | Status: AC
  Administered 2020-04-01: 10 mg via INTRAVENOUS
  Filled 2020-04-01: qty 10

## 2020-04-01 MED ORDER — PACLITAXEL PROTEIN-BOUND CHEMO INJECTION 100 MG
125.0000 mg/m2 | Freq: Once | INTRAVENOUS | Status: AC
  Administered 2020-04-01: 225 mg via INTRAVENOUS
  Filled 2020-04-01: qty 45

## 2020-04-01 MED ORDER — SODIUM CHLORIDE 0.9% FLUSH
10.0000 mL | INTRAVENOUS | Status: DC | PRN
  Administered 2020-04-01: 10 mL
  Filled 2020-04-01: qty 10

## 2020-04-01 MED ORDER — PALONOSETRON HCL INJECTION 0.25 MG/5ML
INTRAVENOUS | Status: AC
  Filled 2020-04-01: qty 5

## 2020-04-01 MED ORDER — PALONOSETRON HCL INJECTION 0.25 MG/5ML
0.2500 mg | Freq: Once | INTRAVENOUS | Status: AC
  Administered 2020-04-01: 0.25 mg via INTRAVENOUS

## 2020-04-01 MED ORDER — SODIUM CHLORIDE 0.9 % IV SOLN
1000.0000 mg/m2 | Freq: Once | INTRAVENOUS | Status: AC
  Administered 2020-04-01: 1710 mg via INTRAVENOUS
  Filled 2020-04-01: qty 44.97

## 2020-04-01 NOTE — Telephone Encounter (Signed)
Called and confirmed with pt's old pharmacy ([facility]) to cancel her remaining ativan refill.

## 2020-04-01 NOTE — Progress Notes (Signed)
[facility] OFFICE PROGRESS NOTE   Diagnosis: Pancreas cancer  INTERVAL HISTORY:   Rebecca Weaver returns as scheduled.  She continues to have suprapubic pain.  The pain has improved with the increased dose of oxycodone.  She has milder pain in the upper abdomen and back.  No other complaint.  Objective:  Vital signs in last 24 hours:  Blood pressure 129/77, pulse 94, temperature (!) 96.7 F (35.9 C), temperature source Tympanic, resp. rate 18, height 5\' 7"  (1.702 m), weight 129 lb (58.5 kg), SpO2 96 %.   GI: No hepatomegaly, mild tenderness in the mid upper abdomen, no mass, tenderness in the suprapubic region without a palpable mass Vascular: Pitting edema at the left greater than right lower leg   Portacath/PICC-without erythema  Lab Results:  Lab Results  Component Value Date   WBC 6.8 03/31/2020   HGB 11.6 (L) 03/31/2020   HCT 33.8 (L) 03/31/2020   MCV 97.1 03/31/2020   PLT 194 03/31/2020   NEUTROABS 4.8 03/31/2020    CMP  Lab Results  Component Value Date   NA 140 03/31/2020   K 3.6 03/31/2020   CL 102 03/31/2020   CO2 28 03/31/2020   GLUCOSE 150 (H) 03/31/2020   BUN 7 (L) 03/31/2020   CREATININE 0.69 03/31/2020   CALCIUM 9.0 03/31/2020   PROT 6.1 (L) 03/31/2020   ALBUMIN 3.2 (L) 03/31/2020   AST 15 03/31/2020   ALT 13 03/31/2020   ALKPHOS 89 03/31/2020   BILITOT 0.4 03/31/2020   GFRNONAA >60 03/31/2020   GFRAA 97 01/26/2020    Imaging:  CT Abdomen Pelvis W Wo Contrast  Result Date: 03/31/2020 CLINICAL DATA:  Restaging pancreas neoplasm EXAM: CT ABDOMEN AND PELVIS WITHOUT AND WITH CONTRAST TECHNIQUE: Multidetector CT imaging of the abdomen and pelvis was performed following the standard protocol before and following the bolus administration of intravenous contrast. CONTRAST:  184mL OMNIPAQUE IOHEXOL 300 MG/ML  SOLN COMPARISON:  MRI 12/30/2019 and PET-CT from 01/21/2020 FINDINGS: Lower chest: Centrilobular and paraseptal emphysema.  Hepatobiliary: No suspicious liver abnormality. Small cyst in far lateral aspect of left lobe of liver measures 6 mm. Adjacent cyst measures 5 mm. These are both unchanged from MR dated 12/30/2019. No suspicious liver lesions identified at this time. Previous cholecystectomy. Chronic increase caliber of the common bile duct is again noted a. on today's exam the CBD measures 1.2 cm, image 36/13. Unchanged from previous MRI. Pancreas: Mass within the body and tail of pancreas measures 2.4 x 2.8 cm, image 51/11. This is compared with 2.1 x 2.0 cm. The tumor extends posteriorly to partially involve the superior mesenteric artery without complete encasement, image 51/11. There is progressive encasement and narrowing of the portal venous confluence which is focally decreased in caliber compared with the previous exam, image 43/13. Spleen: Normal in size without focal abnormality. Adrenals/Urinary Tract: Lobular appearing left adrenal gland is similar to previous exam. No discrete adrenal lesion. Bilateral renal cortical scarring is noted. Multiple right kidney cysts are noted. No suspicious mass or hydronephrosis identified. Stomach/Bowel: Stomach appears within normal limits. No dilated loops of large or small bowel. The appendix is visualized and appears normal. No bowel wall thickening or inflammation. Vascular/Lymphatic: Aortic atherosclerosis. No aneurysm. No abdominopelvic adenopathy. Reproductive: Posterior uterine fibroid is noted measuring approximately 3.3 cm. No adnexal mass. Other: No ascites or focal fluid collections. No peritoneal nodule or mass. Musculoskeletal: Bones appear osteopenic. No suspicious lytic or sclerotic bone lesions. IMPRESSION: 1. Interval increase in size of  pancreatic body and tail mass. There is progressive encasement and narrowing of the portal venous confluence which is focally decreased in caliber compared with the previous exam. 2. No findings of nodal metastasis or solid organ  metastasis within the abdomen or pelvis. 3. Emphysema and aortic atherosclerosis. Aortic Atherosclerosis (ICD10-I70.0) and Emphysema (ICD10-J43.9). Electronically Signed   By: Kerby Moors M.D.   On: 03/31/2020 17:17    Medications: I have reviewed the patient's current medications.   Assessment/Plan: 1. Pancreas cancer, adenocarcinoma the pancreas body, status post a CT-guided biopsy 01/09/2020 ? CTs 12/29/2019-pancreas body mass adjacent to the SMA, 11 mm right upper lobe nodule and 6 mm left upper lobe nodule as well as other tiny lung nodules new compared to a CT from 11/21/2017.  Upper normal right hilar and subcarinal nodes ? 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 EUS 01/02/2020-irregular pancreas body mass with poorly defined borders, lack of portal vein invasion, FNA not possible, no evidence of invasion into the portal vein ? PET 01/21/2020-hypermetabolic pancreas body/neck mass, hypermetabolic 1 cm right upper lobe nodule, other small pulmonary nodules below PET resolution ? Cycle 1 gemcitabine/Abraxane 02/06/2020 ? Cycle 2 gemcitabine/Abraxane 02/20/2020, Aloxi/Decadron added to the antiemetic regimen ? Cycle 3 gemcitabine/Abraxane 03/03/2020 ? Cycle 4 gemcitabine/abraxane 03/18/2020  ? CT abdomen/pelvis 03/31/2020- increase in size of the pancreas body/tail mass with progressive encasement of the portal venous confluence, posterior extension to involve the SMA without complete encasement, no evidence of metastatic disease ? Cycle 5 gemcitabine/Abraxane 04/01/2020 2.   Abdomen/back pain secondary to #1 3.   Anorexia/weight loss secondary to #1 4.   Atrial flutter 5.   COPD 6.   Port-A-Cath placement 01/20/2020 7.  Constipation, secondary  to #1, managed with Senokot and MiraLAX 8. Stye of left eye 03/18/2020 9. Burning abdominal pain 03/18/20      Disposition: Rebecca Weaver has a history of locally advanced pancreas cancer.  She has completed 4 treatments with gemcitabine/Abraxane.  The CT abdomen/pelvis shows slight enlargement of the pancreas mass with progressive narrowing of the portal venous confluence and involvement of the SMA.  No evidence of metastatic disease.  There was a gap of several weeks between the baseline imaging and initiation of systemic therapy.  I discussed treatment options with Rebecca Weaver and her husband.  She would like to continue treatment with gemcitabine/Abraxane for now since there is no clear evidence of disease progression and the lower abdominal pain is not clearly related to pancreas cancer.  She continues to have pain in the suprapubic region with associated tenderness.  I reviewed the pelvic CT images and radiology.  She has significant constipation, but no other CT finding to explain the pain.  We will increase her bowel regimen to see if this helps.  She will continue oxycodone as needed for pain.  I will present her case at the GI tumor conference on 04/14/2020.  She will return for an office visit as scheduled on 04/15/2020  Betsy Coder, MD  04/01/2020  12:58 PM

## 2020-04-07 ENCOUNTER — Telehealth: Payer: Self-pay

## 2020-04-07 NOTE — Telephone Encounter (Signed)
Patient calls stating she saw Dr. Benay Spice last week, was having a bowel issue but that is resolved.  Now having increased pain from cancer radiating to her back that she states is worse than ever rates it 7/10.  She is having to take two of Oxycodone every 4 hours which does decrease the pain to 3-4/10.    She has follow up with Dr. Benay Spice on 04/15/2020 however is concerned with this increased pain.  Recent scans showed no response to current treatment and Dr. Benay Spice was considering another treatment.  She is wondering if she needs to be seen prior to 04/15/2020.

## 2020-04-08 ENCOUNTER — Inpatient Hospital Stay (HOSPITAL_BASED_OUTPATIENT_CLINIC_OR_DEPARTMENT_OTHER): Payer: Medicare Other | Admitting: Medical

## 2020-04-08 ENCOUNTER — Other Ambulatory Visit: Payer: Self-pay

## 2020-04-08 VITALS — BP 112/60 | HR 82 | Temp 97.6°F | Resp 14 | Ht 67.0 in | Wt 131.7 lb

## 2020-04-08 DIAGNOSIS — C259 Malignant neoplasm of pancreas, unspecified: Secondary | ICD-10-CM | POA: Diagnosis not present

## 2020-04-08 DIAGNOSIS — G893 Neoplasm related pain (acute) (chronic): Secondary | ICD-10-CM

## 2020-04-08 DIAGNOSIS — R600 Localized edema: Secondary | ICD-10-CM | POA: Diagnosis not present

## 2020-04-08 DIAGNOSIS — Z5111 Encounter for antineoplastic chemotherapy: Secondary | ICD-10-CM | POA: Diagnosis not present

## 2020-04-08 MED ORDER — OXYCODONE HCL 5 MG PO TABS: ORAL_TABLET | ORAL | 0 refills | Status: DC

## 2020-04-08 MED ORDER — MORPHINE SULFATE ER 15 MG PO TBCR: 15.0000 mg | EXTENDED_RELEASE_TABLET | Freq: Two times a day (BID) | ORAL | 0 refills | Status: DC

## 2020-04-09 ENCOUNTER — Ambulatory Visit (HOSPITAL_COMMUNITY)
Admission: RE | Admit: 2020-04-09 | Discharge: 2020-04-09 | Disposition: A | Discharge status: Home / Self Care | Payer: Medicare Other | Source: Ambulatory Visit | Attending: Medical | Admitting: Medical

## 2020-04-09 DIAGNOSIS — R600 Localized edema: Secondary | ICD-10-CM

## 2020-04-09 NOTE — Progress Notes (Signed)
Left lower extremity venous duplex has been completed. Preliminary results can be found in [facility] through chart review.  Results were given to Sandi Mealy PA.  04/09/20 9:04 AM Rebecca Weaver RVT

## 2020-04-13 NOTE — Progress Notes (Signed)
[facility] Progress Note   Rebecca Weaver [id number] 01-06-1951 70 y.o.  Rebecca Weaver is managed by Dr. Dominica Severin B. Sherrill  Actively treated with chemotherapy/immunotherapy/hormonal therapy: yes  Current therapy: Gemzar and Abraxane  Last treated: 04/01/2020 (cycle #3, day #1)  Next scheduled appointment with provider: 04/15/2020  Assessment: Plan:    Unilateral edema of lower extremity - Plan: VAS [place] LOWER EXTREMITY VENOUS (DVT)  Neoplasm related pain - Plan: morphine (MS CONTIN) 15 MG 12 hr tablet, oxyCODONE (OXY IR/ROXICODONE) 5 MG immediate release tablet  Pancreatic adenocarcinoma (HCC)  Atrial flutter with rapid ventricular response (HCC)   Unilateral left lower extremity edema: The patient was referred for a Doppler ultrasound which will be completed tomorrow.  She was told to continue taking Eliquis.  Neoplasm related pain: Dr. Dominica Severin B. Benay Spice had previously discussed the addition of a long-acting pain medication with the patient.  She reports that her back pain has increased in severity and that she continues to have left leg pain.  She has been taking around 3 oxycodone every 4 hours with Tylenol in between to control her pain.  Based on this she was given a prescription for morphine sulfate 15 mg every 12 hours and was given a refill of OxyIR.  Pancreatic adenocarcinoma: The patient is status post cycle 3, day 1 of gemcitabine and Abraxane which was dosed on 04/01/2020.  Dr. Benay Spice had previously discussed with the patient the possibility that she may need to transition to a another therapy.  She is scheduled to be seen in follow-up on 04/15/2020.  Please see After Visit Summary for patient specific instructions.  Future Appointments  Date Time Provider [place]  04/15/2020 10:15 AM [facility] LAB [facility] None  04/15/2020 10:30 AM [facility] INFUSION [facility] None  04/15/2020 11:00 AM Ladell Pier, MD [facility] None  04/15/2020  11:45 AM [facility] INFUSION [facility] None  04/15/2020  1:45 PM Karie Mainland, RD [facility] None  05/31/2020  1:20 PM O'Neal, Cassie Freer, MD [facility]    Orders Placed This Encounter  Procedures  . VAS [place] LOWER EXTREMITY VENOUS (DVT)       Subjective:   Patient ID:  Rebecca Weaver is a 69 y.o. (DOB 1950-12-14) female.  Chief Complaint: No chief complaint on file.   HPI Rebecca Weaver  is a 70 y.o. female with a diagnosis of a metastatic pancreatic adenocarcinoma.  She is managed by Dr. Benay Spice and is status post cycle 3, day 1 of gemcitabine and Abraxane which was dosed on 04/01/2020.  She presents to the clinic today with progressive left lower extremity edema.  She reports that she had some swelling when she was seen by Dr. Dominica Severin B. Sherrill last but it has become significantly worse.  She reports that she is even had some swelling of serous fluid from her legs due to her edema.  She is also having pain which continues in her back and has been progressive in her left lower extremity.  She reports that her left lower extremity has been increasing in size since last evening.  She has been taking OxyIR 5 mg tablets.  She has been taking 3 of these every 4 hours with Tylenol in between without good pain control.  She reports that Dr. Benay Spice who discussed with her at the last visit the possibility of adding a long-acting pain medication.  She is interested in pursuing this now.  She has a history of atrial fibrillation with RVR.  She continues on Eliquis and has not missed any doses.  She denies shortness of breath or chest pain.  She is scheduled to be seen by Dr. Dominica Severin B. Sherrill in follow-up on 04/15/2020.   Medications: I have reviewed the patient's current medications.  Allergies:  Allergies  Allergen Reactions  . Atorvastatin     Body aches  . Ciprofloxacin     Per patient  "swollen tongue and breathing problems"   . Sulfa Antibiotics     Per patient "swollen  tongue and breathing problems"    Past Medical History:  Diagnosis Date  . Anemia   . Arrhythmia    Atrial fibrillation s/p TEE/DCCV 01/29/2020  . Arthritis   . COPD (chronic obstructive pulmonary disease) ([place])   . Coronary artery calcification   . Family history of breast cancer   . Family history of colon cancer   . GERD (gastroesophageal reflux disease)   . Hyperlipidemia   . Hypertension   . Lung nodules   . Osteopenia   . Pseudotumor cerebri     Past Surgical History:  Procedure Laterality Date  . CARDIOVERSION N/A 01/29/2020   Procedure: CARDIOVERSION;  Surgeon: Buford Dresser, MD;  Location: [facility] ENDOSCOPY;  Service: Cardiovascular;  Laterality: N/A;  . CHOLECYSTECTOMY    . ESOPHAGOGASTRODUODENOSCOPY (EGD) WITH PROPOFOL N/A 01/02/2020   Procedure: ESOPHAGOGASTRODUODENOSCOPY (EGD) WITH PROPOFOL;  Surgeon: [name], MD;  Location: [facility] ENDOSCOPY;  Service: Endoscopy;  Laterality: N/A;  . FINE NEEDLE ASPIRATION N/A 01/02/2020   Procedure: FINE NEEDLE ASPIRATION (FNA) LINEAR;  Surgeon: [name], MD;  Location: [facility] ENDOSCOPY;  Service: Endoscopy;  Laterality: N/A;  . IR IMAGING GUIDED PORT INSERTION  01/20/2020  . TEE WITHOUT CARDIOVERSION N/A 01/29/2020   Procedure: TRANSESOPHAGEAL ECHOCARDIOGRAM (TEE);  Surgeon: Buford Dresser, MD;  Location: [place];  Service: Cardiovascular;  Laterality: N/A;  . TUBAL LIGATION    . UPPER ESOPHAGEAL ENDOSCOPIC ULTRASOUND (EUS) N/A 01/02/2020   Procedure: UPPER ESOPHAGEAL ENDOSCOPIC ULTRASOUND (EUS);  Surgeon: [name], MD;  Location: [facility] ENDOS[facility];  Service: Endoscopy;  Laterality: N/A;  . VARICOSE VEIN SURGERY     multiple    Family History  Problem Relation Age of Onset  . Varicose Veins Father   . Heart disease Father   . Varicose Veins Sister     Social History   Socioeconomic History  . Marital status: Married    Spouse name: Not on file  . Number of children: 2  . Years of education: Not on  file  . Highest education level: Not on file  Occupational History  . Occupation: retired - Development worker, community  Tobacco Use  . Smoking status: Current Every Day Smoker    Packs/day: 0.75    Years: 50.00    Pack years: 37.50    Types: Cigarettes  . Smokeless tobacco: Never Used  Substance and Sexual Activity  . Alcohol use: Yes    Alcohol/week: 0.0 standard drinks    Comment: social occassions only  . Drug use: Not Currently  . Sexual activity: Not on file  Other Topics Concern  . Not on file  Social History Narrative  . Not on file   Social Determinants of Health   Financial Resource Strain: Low Risk   . Difficulty of Paying Living Expenses: Not hard at all  Food Insecurity: Not on file  Transportation Needs: No Transportation Needs  . Lack of Transportation (Medical): No  . Lack of Transportation (Non-Medical): No  Physical Activity: Insufficiently Active  .  Days of Exercise per Week: 5 days  . Minutes of Exercise per Session: 20 min  Stress: No Stress Concern Present  . Feeling of Stress : Not at all  Social Connections: Unknown  . Frequency of Communication with Friends and Family: More than three times a week  . Frequency of Social Gatherings with Friends and Family: More than three times a week  . Attends Religious Services: Not on file  . Active Member of Clubs or Organizations: Not on file  . Attends Archivist Meetings: Not on file  . Marital Status: Married  Human resources officer Violence: Not on file    Past Medical History, Surgical history, Social history, and Family history were reviewed and updated as appropriate.   Please see review of systems for further details on the patient's review from today.   Review of Systems:  Review of Systems  Constitutional: Negative for chills, diaphoresis and fever.  HENT: Negative for trouble swallowing and voice change.   Respiratory: Negative for cough, chest tightness, shortness of breath and wheezing.    Cardiovascular: Positive for leg swelling. Negative for chest pain and palpitations.  Gastrointestinal: Negative for abdominal pain, constipation, diarrhea, nausea and vomiting.  Musculoskeletal: Positive for back pain. Negative for myalgias.  Neurological: Negative for dizziness, light-headedness and headaches.    Objective:   Physical Exam:  BP 112/60 (BP Location: Left Arm, Patient Position: Sitting)   Pulse 82   Temp 97.6 F (36.4 C) (Tympanic)   Resp 14   Ht 5\' 7"  (1.702 m)   Wt 131 lb 11.2 oz (59.7 kg)   SpO2 96%   BMI 20.63 kg/m  ECOG: 1  Physical Exam Constitutional:      General: She is not in acute distress.    Appearance: She is not diaphoretic.  HENT:     Head: Normocephalic and atraumatic.  Cardiovascular:     Rate and Rhythm: Normal rate and regular rhythm.     Heart sounds: Normal heart sounds. No murmur heard. No friction rub. No gallop.   Pulmonary:     Effort: Pulmonary effort is normal. No respiratory distress.     Breath sounds: Normal breath sounds. No wheezing or rales.  Musculoskeletal:     Right lower leg: Edema present.     Left lower leg: Edema present.       Legs:  Skin:    General: Skin is warm and dry.     Findings: No erythema or rash.  Neurological:     Mental Status: She is alert.     Coordination: Coordination normal.     Gait: Gait normal.  Psychiatric:        Mood and Affect: Mood normal.        Behavior: Behavior normal.        Thought Content: Thought content normal.        Judgment: Judgment normal.     Lab Review:     Component Value Date/Time   NA 140 03/31/2020 1015   NA 136 01/26/2020 1215   K 3.6 03/31/2020 1015   CL 102 03/31/2020 1015   CO2 28 03/31/2020 1015   GLUCOSE 150 (H) 03/31/2020 1015   BUN 7 (L) 03/31/2020 1015   BUN 8 01/26/2020 1215   CREATININE 0.69 03/31/2020 1015   CALCIUM 9.0 03/31/2020 1015   PROT 6.1 (L) 03/31/2020 1015   ALBUMIN 3.2 (L) 03/31/2020 1015   AST 15 03/31/2020 1015   ALT  13 03/31/2020 1015  ALKPHOS 89 03/31/2020 1015   BILITOT 0.4 03/31/2020 1015   GFRNONAA >60 03/31/2020 1015   GFRAA 97 01/26/2020 1215       Component Value Date/Time   WBC 6.8 03/31/2020 1015   WBC 8.5 01/09/2020 1003   RBC 3.48 (L) 03/31/2020 1015   HGB 11.6 (L) 03/31/2020 1015   HGB 16.0 (H) 01/26/2020 1215   HCT 33.8 (L) 03/31/2020 1015   HCT 46.0 01/26/2020 1215   PLT 194 03/31/2020 1015   PLT 246 01/26/2020 1215   MCV 97.1 03/31/2020 1015   MCV 97 01/26/2020 1215   MCH 33.3 03/31/2020 1015   MCHC 34.3 03/31/2020 1015   RDW 15.1 03/31/2020 1015   RDW 11.7 01/26/2020 1215   LYMPHSABS 1.1 03/31/2020 1015   LYMPHSABS 1.7 01/14/2020 1509   MONOABS 0.7 03/31/2020 1015   EOSABS 0.1 03/31/2020 1015   EOSABS 0.2 01/14/2020 1509   BASOSABS 0.0 03/31/2020 1015   BASOSABS 0.1 01/14/2020 1509   -------------------------------  Imaging from last 24 hours (if applicable):  Radiology interpretation: CT Abdomen Pelvis W Wo Contrast  Result Date: 03/31/2020 CLINICAL DATA:  Restaging pancreas neoplasm EXAM: CT ABDOMEN AND PELVIS WITHOUT AND WITH CONTRAST TECHNIQUE: Multidetector CT imaging of the abdomen and pelvis was performed following the standard protocol before and following the bolus administration of intravenous contrast. CONTRAST:  171mL OMNIPAQUE IOHEXOL 300 MG/ML  SOLN COMPARISON:  MRI 12/30/2019 and PET-CT from 01/21/2020 FINDINGS: Lower chest: Centrilobular and paraseptal emphysema. Hepatobiliary: No suspicious liver abnormality. Small cyst in far lateral aspect of left lobe of liver measures 6 mm. Adjacent cyst measures 5 mm. These are both unchanged from MR dated 12/30/2019. No suspicious liver lesions identified at this time. Previous cholecystectomy. Chronic increase caliber of the common bile duct is again noted a. on today's exam the CBD measures 1.2 cm, image 36/13. Unchanged from previous MRI. Pancreas: Mass within the body and tail of pancreas measures 2.4 x 2.8 cm,  image 51/11. This is compared with 2.1 x 2.0 cm. The tumor extends posteriorly to partially involve the superior mesenteric artery without complete encasement, image 51/11. There is progressive encasement and narrowing of the portal venous confluence which is focally decreased in caliber compared with the previous exam, image 43/13. Spleen: Normal in size without focal abnormality. Adrenals/Urinary Tract: Lobular appearing left adrenal gland is similar to previous exam. No discrete adrenal lesion. Bilateral renal cortical scarring is noted. Multiple right kidney cysts are noted. No suspicious mass or hydronephrosis identified. Stomach/Bowel: Stomach appears within normal limits. No dilated loops of large or small bowel. The appendix is visualized and appears normal. No bowel wall thickening or inflammation. Vascular/Lymphatic: Aortic atherosclerosis. No aneurysm. No abdominopelvic adenopathy. Reproductive: Posterior uterine fibroid is noted measuring approximately 3.3 cm. No adnexal mass. Other: No ascites or focal fluid collections. No peritoneal nodule or mass. Musculoskeletal: Bones appear osteopenic. No suspicious lytic or sclerotic bone lesions. IMPRESSION: 1. Interval increase in size of pancreatic body and tail mass. There is progressive encasement and narrowing of the portal venous confluence which is focally decreased in caliber compared with the previous exam. 2. No findings of nodal metastasis or solid organ metastasis within the abdomen or pelvis. 3. Emphysema and aortic atherosclerosis. Aortic Atherosclerosis (ICD10-I70.0) and Emphysema (ICD10-J43.9). Electronically Signed   By: Kerby Moors M.D.   On: 03/31/2020 17:17   VAS [place] LOWER EXTREMITY VENOUS (DVT)  Result Date: 04/09/2020  Lower Venous DVT Study Indications: Swelling, and Pain.  Risk Factors: Cancer. Limitations: Poor  ultrasound/tissue interface. Comparison Study: No prior studies. Performing Technologist: Oliver Hum RVT  Examination  Guidelines: A complete evaluation includes B-mode imaging, spectral Doppler, color Doppler, and power Doppler as needed of all accessible portions of each vessel. Bilateral testing is considered an integral part of a complete examination. Limited examinations for reoccurring indications may be performed as noted. The reflux portion of the exam is performed with the patient in reverse Trendelenburg.  +-----+---------------+---------+-----------+----------+--------------+ RIGHTCompressibilityPhasicitySpontaneityPropertiesThrombus Aging +-----+---------------+---------+-----------+----------+--------------+ CFV  Full           Yes      Yes                                 +-----+---------------+---------+-----------+----------+--------------+   +---------+---------------+---------+-----------+----------+--------------+ LEFT     CompressibilityPhasicitySpontaneityPropertiesThrombus Aging +---------+---------------+---------+-----------+----------+--------------+ CFV      Full           Yes      Yes                                 +---------+---------------+---------+-----------+----------+--------------+ SFJ      Full                                                        +---------+---------------+---------+-----------+----------+--------------+ FV Prox  Full                                                        +---------+---------------+---------+-----------+----------+--------------+ FV Mid   Full                                                        +---------+---------------+---------+-----------+----------+--------------+ FV DistalFull                                                        +---------+---------------+---------+-----------+----------+--------------+ PFV      Full                                                        +---------+---------------+---------+-----------+----------+--------------+ POP      Full           Yes      Yes                                  +---------+---------------+---------+-----------+----------+--------------+ PTV      Full                                                        +---------+---------------+---------+-----------+----------+--------------+  PERO     Full                                                        +---------+---------------+---------+-----------+----------+--------------+     Summary: RIGHT: - No evidence of common femoral vein obstruction.  LEFT: - There is no evidence of deep vein thrombosis in the lower extremity.  - No cystic structure found in the popliteal fossa.  *See table(s) above for measurements and observations. Electronically signed by Jamelle Haring on 04/09/2020 at 2:32:52 PM.    Final

## 2020-04-14 ENCOUNTER — Other Ambulatory Visit: Payer: Self-pay

## 2020-04-14 MED FILL — Dexamethasone Sodium Phosphate Inj 100 MG/10ML: INTRAMUSCULAR | Qty: 1 | Status: AC

## 2020-04-14 NOTE — Progress Notes (Signed)
The proposed treatment discussed in conference is for discussion purposes only and is not a binding recommendation.  The patients have not been physically examined, or presented with their treatment options.  Therefore, final treatment plans cannot be decided.   

## 2020-04-15 ENCOUNTER — Inpatient Hospital Stay (HOSPITAL_BASED_OUTPATIENT_CLINIC_OR_DEPARTMENT_OTHER): Payer: Medicare Other | Admitting: Oncology

## 2020-04-15 ENCOUNTER — Other Ambulatory Visit: Payer: Medicare Other

## 2020-04-15 ENCOUNTER — Ambulatory Visit: Payer: Medicare Other

## 2020-04-15 ENCOUNTER — Inpatient Hospital Stay: Payer: Medicare Other | Attending: Oncology

## 2020-04-15 ENCOUNTER — Other Ambulatory Visit: Payer: Self-pay

## 2020-04-15 ENCOUNTER — Inpatient Hospital Stay: Payer: Medicare Other

## 2020-04-15 ENCOUNTER — Ambulatory Visit: Payer: Medicare Other | Admitting: Nurse Practitioner

## 2020-04-15 ENCOUNTER — Inpatient Hospital Stay: Payer: Medicare Other | Admitting: Nutrition

## 2020-04-15 VITALS — BP 108/65 | HR 87 | Temp 98.5°F | Resp 18 | Ht 67.0 in | Wt 132.2 lb

## 2020-04-15 DIAGNOSIS — Z5111 Encounter for antineoplastic chemotherapy: Secondary | ICD-10-CM | POA: Insufficient documentation

## 2020-04-15 DIAGNOSIS — J449 Chronic obstructive pulmonary disease, unspecified: Secondary | ICD-10-CM | POA: Diagnosis not present

## 2020-04-15 DIAGNOSIS — R609 Edema, unspecified: Secondary | ICD-10-CM | POA: Insufficient documentation

## 2020-04-15 DIAGNOSIS — L539 Erythematous condition, unspecified: Secondary | ICD-10-CM | POA: Diagnosis not present

## 2020-04-15 DIAGNOSIS — C251 Malignant neoplasm of body of pancreas: Secondary | ICD-10-CM | POA: Diagnosis present

## 2020-04-15 DIAGNOSIS — C259 Malignant neoplasm of pancreas, unspecified: Secondary | ICD-10-CM

## 2020-04-15 DIAGNOSIS — Z95828 Presence of other vascular implants and grafts: Secondary | ICD-10-CM

## 2020-04-15 LAB — CMP (CANCER CENTER ONLY)
ALT: 14 U/L (ref 0–44)
AST: 17 U/L (ref 15–41)
Albumin: 3.3 g/dL — ABNORMAL LOW (ref 3.5–5.0)
Alkaline Phosphatase: 95 U/L (ref 38–126)
Anion gap: 8 (ref 5–15)
BUN: 6 mg/dL — ABNORMAL LOW (ref 8–23)
CO2: 29 mmol/L (ref 22–32)
Calcium: 9.3 mg/dL (ref 8.9–10.3)
Chloride: 101 mmol/L (ref 98–111)
Creatinine: 0.67 mg/dL (ref 0.44–1.00)
GFR, Estimated: >60 mL/min (ref >60)
Glucose, Bld: 122 mg/dL — ABNORMAL HIGH (ref 70–99)
Potassium: 3.6 mmol/L (ref 3.5–5.1)
Sodium: 138 mmol/L (ref 135–145)
Total Bilirubin: 0.6 mg/dL (ref 0.3–1.2)
Total Protein: 5.9 g/dL — ABNORMAL LOW (ref 6.5–8.1)

## 2020-04-15 LAB — CBC WITH DIFFERENTIAL (CANCER CENTER ONLY)
Abs Immature Granulocytes: 0.04 10*3/uL (ref 0.00–0.07)
Basophils Absolute: 0 10*3/uL (ref 0.0–0.1)
Basophils Relative: 1 %
Eosinophils Absolute: 0.1 10*3/uL (ref 0.0–0.5)
Eosinophils Relative: 1 %
HCT: 32 % — ABNORMAL LOW (ref 36.0–46.0)
Hemoglobin: 11 g/dL — ABNORMAL LOW (ref 12.0–15.0)
Immature Granulocytes: 1 %
Lymphocytes Relative: 11 %
Lymphs Abs: 0.9 10*3/uL (ref 0.7–4.0)
MCH: 33.5 pg (ref 26.0–34.0)
MCHC: 34.4 g/dL (ref 30.0–36.0)
MCV: 97.6 fL (ref 80.0–100.0)
Monocytes Absolute: 1 10*3/uL (ref 0.1–1.0)
Monocytes Relative: 12 %
Neutro Abs: 6.5 10*3/uL (ref 1.7–7.7)
Neutrophils Relative %: 74 %
Platelet Count: 196 10*3/uL (ref 150–400)
RBC: 3.28 MIL/uL — ABNORMAL LOW (ref 3.87–5.11)
RDW: 17.1 % — ABNORMAL HIGH (ref 11.5–15.5)
WBC Count: 8.6 10*3/uL (ref 4.0–10.5)
nRBC: 0.2 % (ref 0.0–0.2)

## 2020-04-15 MED ORDER — SODIUM CHLORIDE 0.9% FLUSH
10.0000 mL | INTRAVENOUS | Status: DC | PRN
  Filled 2020-04-15: qty 10

## 2020-04-15 MED ORDER — SODIUM CHLORIDE 0.9% FLUSH
10.0000 mL | Freq: Once | INTRAVENOUS | Status: AC
  Administered 2020-04-15: 10 mL
  Filled 2020-04-15: qty 10

## 2020-04-15 MED ORDER — FUROSEMIDE 20 MG PO TABS: 20.0000 mg | ORAL_TABLET | Freq: Every day | ORAL | 3 refills | Status: DC

## 2020-04-15 MED ORDER — HEPARIN SOD (PORK) LOCK FLUSH 100 UNIT/ML IV SOLN
500.0000 [IU] | Freq: Once | INTRAVENOUS | Status: AC
  Administered 2020-04-15: 500 [IU] via INTRAVENOUS
  Filled 2020-04-15: qty 5

## 2020-04-15 MED ORDER — DOXYCYCLINE HYCLATE 100 MG PO TABS: 100.0000 mg | ORAL_TABLET | Freq: Two times a day (BID) | ORAL | 0 refills | Status: DC

## 2020-04-15 NOTE — Progress Notes (Signed)
DISCONTINUE ON PATHWAY REGIMEN - Pancreatic Adenocarcinoma     A cycle is every 28 days:     Nab-paclitaxel (protein bound)      Gemcitabine   **Always confirm dose/schedule in your pharmacy ordering system**  REASON: Disease Progression PRIOR TREATMENT: [id number]: Nab-Paclitaxel (Abraxane) 125 mg/m2 D1, 8, 15 + Gemcitabine 1,000 mg/m2 D1, 8, 15 q28 Days x 2 Cycles TREATMENT RESPONSE: Progressive Disease (PD)  START ON PATHWAY REGIMEN - Pancreatic Adenocarcinoma     A cycle is every 14 days:     Oxaliplatin      Leucovorin      Irinotecan      Fluorouracil   **Always confirm dose/schedule in your pharmacy ordering system**  Patient Characteristics: Preoperative (Clinical Staging), Borderline Resectable, PS = 0,1, BRCA1/2 and PALB2 Mutation Absent/Unknown Therapeutic Status: Preoperative (Clinical Staging) AJCC T Category: Staged < 8th Ed. AJCC N Category: Staged < 8th Ed. Resectability Status: Borderline Resectable AJCC M Category: Staged < 8th Ed. AJCC 8 Stage Grouping: Staged < 8th Ed. ECOG Performance Status: 1 BRCA1/2 Mutation Status: Absent PALB2 Mutation Status: Absent Intent of Therapy: Curative Intent, Discussed with Patient

## 2020-04-15 NOTE — Progress Notes (Signed)
Change in treatment plan.  Patient given printed information on Folfirinox and was shown a CAD pump today.

## 2020-04-15 NOTE — Patient Instructions (Signed)

## 2020-04-15 NOTE — Progress Notes (Signed)
[facility] OFFICE PROGRESS NOTE   Diagnosis: Pancreas cancer  INTERVAL HISTORY:   Rebecca Weaver returns for a scheduled visit.  The suprapubic pain resolved after she started a bowel regimen.  She was seen in the symptom management clinic last week.  She started MS Contin.  There has been marked improvement in the pain with MS Contin.  She is taking 30 mg twice daily.  She continues oxycodone as needed for breakthrough pain.  She developed increased swelling in the left lower leg last week.  A Doppler was negative for deep vein thrombosis.  The left leg is tender.  She has chronic edema of both legs, but the left leg edema has progressed over the past week.  Objective:  Vital signs in last 24 hours:  Blood pressure 108/65, pulse 87, temperature 98.5 F (36.9 C), temperature source Tympanic, resp. rate 18, height 5\' 7"  (1.702 m), weight 132 lb 3.2 oz (60 kg), SpO2 96 %.     Resp: Distant breath sounds, no respiratory distress Cardio: Regular rate and rhythm GI: No mass, no hepatosplenomegaly, nontender Vascular: Pitting edema at the left greater than right leg below the knee.  Bilateral lower leg varicosities.  Faint erythema throughout the left lower leg     Portacath/PICC-without erythema  Lab Results:  Lab Results  Component Value Date   WBC 8.6 04/15/2020   HGB 11.0 (L) 04/15/2020   HCT 32.0 (L) 04/15/2020   MCV 97.6 04/15/2020   PLT 196 04/15/2020   NEUTROABS 6.5 04/15/2020    CMP  Lab Results  Component Value Date   NA 138 04/15/2020   K 3.6 04/15/2020   CL 101 04/15/2020   CO2 29 04/15/2020   GLUCOSE 122 (H) 04/15/2020   BUN 6 (L) 04/15/2020   CREATININE 0.67 04/15/2020   CALCIUM 9.3 04/15/2020   PROT 5.9 (L) 04/15/2020   ALBUMIN 3.3 (L) 04/15/2020   AST 17 04/15/2020   ALT 14 04/15/2020   ALKPHOS 95 04/15/2020   BILITOT 0.6 04/15/2020   GFRNONAA >60 04/15/2020   GFRAA 97 01/26/2020     Medications: I have reviewed the patient's  current medications.   Assessment/Plan: 1. Pancreas cancer, adenocarcinoma the pancreas body, status post a CT-guided biopsy 01/09/2020 ? CTs 12/29/2019-pancreas body mass adjacent to the SMA, 11 mm right upper lobe nodule and 6 mm left upper lobe nodule as well as other tiny lung nodules new compared to a CT from 11/21/2017.  Upper normal right hilar and subcarinal nodes ? 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 EUS 01/02/2020-irregular pancreas body mass with poorly defined borders, lack of portal vein invasion, FNA not possible, no evidence of invasion into the portal vein ? PET 01/21/2020-hypermetabolic pancreas body/neck mass, hypermetabolic 1 cm right upper lobe nodule, other small pulmonary nodules below PET resolution ? Cycle 1 gemcitabine/Abraxane 02/06/2020 ? Cycle 2 gemcitabine/Abraxane 02/20/2020, Aloxi/Decadron added to the antiemetic regimen ? Cycle 3 gemcitabine/Abraxane 03/03/2020 ? Cycle 4 gemcitabine/abraxane 03/18/2020  ? CT abdomen/pelvis 03/31/2020- increase in size of the pancreas body/tail mass with progressive encasement of the portal venous confluence, posterior extension to involve the SMA without complete encasement, no evidence of metastatic disease ? Cycle 5 gemcitabine/Abraxane 04/01/2020 ? Cycle 1 FOLFIRINOX 04/21/2020 2.  Abdomen/back pain secondary to #1 3.   Anorexia/weight loss secondary to #1 4.   Atrial flutter 5.   COPD 6.   Port-A-Cath placement 01/20/2020 7.  Constipation, secondary to #1, managed with Senokot and MiraLAX 8. Stye of left eye 03/18/2020 9.  Increased left leg edema with erythema-negative Doppler 04/09/2020, course of doxycycline prescribed 04/15/2020   Disposition: Ms. Bonano has locally advanced  pancreas cancer.  Her case was presented at the GI tumor conference on 04/14/2020.  She appears to have borderline resectable disease with abutment of the SMA.  The pancreas mass has enlarged.  She has experienced increased pain over the past several weeks.  The plan is to change to FOLFIRINOX chemotherapy.  We will also consider radiation if the tumor does not respond to FOLFIRINOX.  We reviewed potential toxicities associated with the FOLFIRINOX regimen including the chance for nausea/vomiting, mucositis, diarrhea, alopecia, and hematologic toxicity.  We discussed the sun sensitivity, rash, hyperpigmentation, and hand/foot syndrome associated with 5-fluorouracil.  We reviewed the allergic reaction and various types of neuropathy seen with oxaliplatin.  We discussed the acute and delayed diarrhea from irinotecan.  She agrees to proceed.  The plan is to begin FOLFIRINOX on 04/21/2020.  She has increased edema with tenderness at the left lower leg.  She will complete a course of doxycycline for cellulitis.  A Doppler was negative last week and she is maintained on anticoagulation therapy.  Have low suspicion for venous thrombosis.  It is possible the erythema is related to gemcitabine.   She will return for an office visit and chemotherapy on 05/05/2020. Betsy Coder, MD  04/15/2020  12:09 PM

## 2020-04-16 ENCOUNTER — Telehealth: Payer: Self-pay | Admitting: Oncology

## 2020-04-16 LAB — CANCER ANTIGEN 19-9: CA 19-9: 28 U/mL (ref 0–35)

## 2020-04-16 NOTE — Telephone Encounter (Signed)
Scheduled appointments per 2/3 los. Scheduled infusion for next week in [place] because [facility] was overbooked. Called patient, no answer. Left message for patient with appointments dates and times as well as locations. Left instructions for patient to call back to clarify appointments if needed.

## 2020-04-18 ENCOUNTER — Other Ambulatory Visit: Payer: Self-pay | Admitting: Oncology

## 2020-04-19 ENCOUNTER — Telehealth: Payer: Self-pay

## 2020-04-19 NOTE — Telephone Encounter (Signed)
Per Dr. Benay Spice: Rebecca Weaver out antibiotic. Patient notified.

## 2020-04-19 NOTE — Telephone Encounter (Signed)
Patient calls to report still having a lot of swelling in lower extremities with discoloration.  They measure the same as before.  Tender to touch.  No difference in the swelling despite elevating them. She is wanting to know if she should continue the current antibiotic.

## 2020-04-22 ENCOUNTER — Other Ambulatory Visit: Payer: Self-pay

## 2020-04-22 ENCOUNTER — Inpatient Hospital Stay: Payer: Medicare Other

## 2020-04-22 DIAGNOSIS — C259 Malignant neoplasm of pancreas, unspecified: Secondary | ICD-10-CM

## 2020-04-22 DIAGNOSIS — Z5111 Encounter for antineoplastic chemotherapy: Secondary | ICD-10-CM | POA: Diagnosis not present

## 2020-04-22 LAB — CBC WITH DIFFERENTIAL (CANCER CENTER ONLY)
Abs Immature Granulocytes: 0.01 10*3/uL (ref 0.00–0.07)
Basophils Absolute: 0.1 10*3/uL (ref 0.0–0.1)
Basophils Relative: 1 %
Eosinophils Absolute: 0 10*3/uL (ref 0.0–0.5)
Eosinophils Relative: 1 %
HCT: 35.3 % — ABNORMAL LOW (ref 36.0–46.0)
Hemoglobin: 11.8 g/dL — ABNORMAL LOW (ref 12.0–15.0)
Immature Granulocytes: 0 %
Lymphocytes Relative: 15 %
Lymphs Abs: 0.9 10*3/uL (ref 0.7–4.0)
MCH: 32.9 pg (ref 26.0–34.0)
MCHC: 33.4 g/dL (ref 30.0–36.0)
MCV: 98.3 fL (ref 80.0–100.0)
Monocytes Absolute: 0.6 10*3/uL (ref 0.1–1.0)
Monocytes Relative: 11 %
Neutro Abs: 4.3 10*3/uL (ref 1.7–7.7)
Neutrophils Relative %: 72 %
Platelet Count: 258 10*3/uL (ref 150–400)
RBC: 3.59 MIL/uL — ABNORMAL LOW (ref 3.87–5.11)
RDW: 16.9 % — ABNORMAL HIGH (ref 11.5–15.5)
WBC Count: 5.9 10*3/uL (ref 4.0–10.5)
nRBC: 0 % (ref 0.0–0.2)

## 2020-04-22 LAB — CMP (CANCER CENTER ONLY)
ALT: 8 U/L (ref 0–44)
AST: 15 U/L (ref 15–41)
Albumin: 3.4 g/dL — ABNORMAL LOW (ref 3.5–5.0)
Alkaline Phosphatase: 81 U/L (ref 38–126)
Anion gap: 6 (ref 5–15)
BUN: 7 mg/dL — ABNORMAL LOW (ref 8–23)
CO2: 27 mmol/L (ref 22–32)
Calcium: 9.1 mg/dL (ref 8.9–10.3)
Chloride: 106 mmol/L (ref 98–111)
Creatinine: 0.56 mg/dL (ref 0.44–1.00)
GFR, Estimated: >60 mL/min (ref >60)
Glucose, Bld: 134 mg/dL — ABNORMAL HIGH (ref 70–99)
Potassium: 3.7 mmol/L (ref 3.5–5.1)
Sodium: 139 mmol/L (ref 135–145)
Total Bilirubin: 0.6 mg/dL (ref 0.3–1.2)
Total Protein: 5.4 g/dL — ABNORMAL LOW (ref 6.5–8.1)

## 2020-04-22 MED ORDER — PALONOSETRON HCL INJECTION 0.25 MG/5ML
0.2500 mg | Freq: Once | INTRAVENOUS | Status: AC
  Administered 2020-04-22: 0.25 mg via INTRAVENOUS

## 2020-04-22 MED ORDER — DEXTROSE 5 % IV SOLN
Freq: Once | INTRAVENOUS | Status: AC
  Filled 2020-04-22: qty 250

## 2020-04-22 MED ORDER — SODIUM CHLORIDE 0.9 % IV SOLN
150.0000 mg | Freq: Once | INTRAVENOUS | Status: AC
  Administered 2020-04-22: 150 mg via INTRAVENOUS
  Filled 2020-04-22: qty 150

## 2020-04-22 MED ORDER — ATROPINE SULFATE 1 MG/ML IJ SOLN
INTRAMUSCULAR | Status: AC
  Filled 2020-04-22: qty 1

## 2020-04-22 MED ORDER — OXALIPLATIN CHEMO INJECTION 100 MG/20ML
85.0000 mg/m2 | Freq: Once | INTRAVENOUS | Status: AC
  Administered 2020-04-22: 145 mg via INTRAVENOUS
  Filled 2020-04-22: qty 20

## 2020-04-22 MED ORDER — SODIUM CHLORIDE 0.9 % IV SOLN
10.0000 mg | Freq: Once | INTRAVENOUS | Status: AC
  Administered 2020-04-22: 10 mg via INTRAVENOUS
  Filled 2020-04-22: qty 10

## 2020-04-22 MED ORDER — SODIUM CHLORIDE 0.9 % IV SOLN
2000.0000 mg/m2 | INTRAVENOUS | Status: DC
  Administered 2020-04-22: 3350 mg via INTRAVENOUS
  Filled 2020-04-22: qty 67

## 2020-04-22 MED ORDER — SODIUM CHLORIDE 0.9 % IV SOLN
150.0000 mg/m2 | Freq: Once | INTRAVENOUS | Status: AC
  Administered 2020-04-22: 260 mg via INTRAVENOUS
  Filled 2020-04-22: qty 13

## 2020-04-22 MED ORDER — PALONOSETRON HCL INJECTION 0.25 MG/5ML
INTRAVENOUS | Status: AC
  Filled 2020-04-22: qty 5

## 2020-04-22 MED ORDER — ATROPINE SULFATE 1 MG/ML IJ SOLN: 0.5000 mg | Freq: Once | INTRAMUSCULAR | Status: DC | PRN

## 2020-04-22 MED ORDER — SODIUM CHLORIDE 0.9 % IV SOLN
400.0000 mg/m2 | Freq: Once | INTRAVENOUS | Status: AC
  Administered 2020-04-22: 672 mg via INTRAVENOUS
  Filled 2020-04-22: qty 33.6

## 2020-04-22 NOTE — Patient Instructions (Signed)

## 2020-04-23 ENCOUNTER — Other Ambulatory Visit: Payer: Self-pay | Admitting: Oncology

## 2020-04-24 ENCOUNTER — Inpatient Hospital Stay: Payer: Medicare Other

## 2020-04-24 ENCOUNTER — Other Ambulatory Visit: Payer: Self-pay

## 2020-04-26 ENCOUNTER — Encounter (HOSPITAL_COMMUNITY): Payer: Self-pay

## 2020-04-26 ENCOUNTER — Telehealth: Payer: Self-pay

## 2020-04-26 ENCOUNTER — Emergency Department (HOSPITAL_COMMUNITY): Payer: Medicare Other

## 2020-04-26 ENCOUNTER — Emergency Department (HOSPITAL_COMMUNITY)
Admission: EM | Admit: 2020-04-26 | Discharge: 2020-04-26 | Disposition: A | Discharge status: Home / Self Care | Payer: Medicare Other | Attending: Emergency Medicine | Admitting: Emergency Medicine

## 2020-04-26 DIAGNOSIS — J449 Chronic obstructive pulmonary disease, unspecified: Secondary | ICD-10-CM | POA: Diagnosis not present

## 2020-04-26 DIAGNOSIS — R790 Abnormal level of blood mineral: Secondary | ICD-10-CM | POA: Insufficient documentation

## 2020-04-26 DIAGNOSIS — R600 Localized edema: Secondary | ICD-10-CM | POA: Insufficient documentation

## 2020-04-26 DIAGNOSIS — Z79899 Other long term (current) drug therapy: Secondary | ICD-10-CM | POA: Diagnosis not present

## 2020-04-26 DIAGNOSIS — R0600 Dyspnea, unspecified: Secondary | ICD-10-CM | POA: Diagnosis present

## 2020-04-26 DIAGNOSIS — Z7901 Long term (current) use of anticoagulants: Secondary | ICD-10-CM | POA: Diagnosis not present

## 2020-04-26 DIAGNOSIS — Z8507 Personal history of malignant neoplasm of pancreas: Secondary | ICD-10-CM | POA: Insufficient documentation

## 2020-04-26 DIAGNOSIS — Z20822 Contact with and (suspected) exposure to covid-19: Secondary | ICD-10-CM | POA: Insufficient documentation

## 2020-04-26 DIAGNOSIS — I10 Essential (primary) hypertension: Secondary | ICD-10-CM | POA: Insufficient documentation

## 2020-04-26 DIAGNOSIS — R0602 Shortness of breath: Secondary | ICD-10-CM | POA: Insufficient documentation

## 2020-04-26 DIAGNOSIS — R609 Edema, unspecified: Secondary | ICD-10-CM

## 2020-04-26 DIAGNOSIS — F17210 Nicotine dependence, cigarettes, uncomplicated: Secondary | ICD-10-CM | POA: Insufficient documentation

## 2020-04-26 HISTORY — DX: Malignant neoplasm of pancreas, unspecified: C25.9

## 2020-04-26 LAB — COMPREHENSIVE METABOLIC PANEL
ALT: 15 U/L (ref 0–44)
AST: 18 U/L (ref 15–41)
Albumin: 3.2 g/dL — ABNORMAL LOW (ref 3.5–5.0)
Alkaline Phosphatase: 58 U/L (ref 38–126)
Anion gap: 8 (ref 5–15)
BUN: 9 mg/dL (ref 8–23)
CO2: 29 mmol/L (ref 22–32)
Calcium: 8.5 mg/dL — ABNORMAL LOW (ref 8.9–10.3)
Chloride: 101 mmol/L (ref 98–111)
Creatinine, Ser: 0.47 mg/dL (ref 0.44–1.00)
GFR, Estimated: >60 mL/min (ref >60)
Glucose, Bld: 109 mg/dL — ABNORMAL HIGH (ref 70–99)
Potassium: 3.5 mmol/L (ref 3.5–5.1)
Sodium: 138 mmol/L (ref 135–145)
Total Bilirubin: 1.3 mg/dL — ABNORMAL HIGH (ref 0.3–1.2)
Total Protein: 5.4 g/dL — ABNORMAL LOW (ref 6.5–8.1)

## 2020-04-26 LAB — POC SARS CORONAVIRUS 2 AG -  ED: SARS Coronavirus 2 Ag: NEGATIVE

## 2020-04-26 LAB — CBC WITH DIFFERENTIAL/PLATELET
Abs Immature Granulocytes: 0.01 10*3/uL (ref 0.00–0.07)
Basophils Absolute: 0 10*3/uL (ref 0.0–0.1)
Basophils Relative: 1 %
Eosinophils Absolute: 0.1 10*3/uL (ref 0.0–0.5)
Eosinophils Relative: 1 %
HCT: 34.5 % — ABNORMAL LOW (ref 36.0–46.0)
Hemoglobin: 11.1 g/dL — ABNORMAL LOW (ref 12.0–15.0)
Immature Granulocytes: 0 %
Lymphocytes Relative: 20 %
Lymphs Abs: 0.8 10*3/uL (ref 0.7–4.0)
MCH: 32.9 pg (ref 26.0–34.0)
MCHC: 32.2 g/dL (ref 30.0–36.0)
MCV: 102.4 fL — ABNORMAL HIGH (ref 80.0–100.0)
Monocytes Absolute: 0.1 10*3/uL (ref 0.1–1.0)
Monocytes Relative: 3 %
Neutro Abs: 2.9 10*3/uL (ref 1.7–7.7)
Neutrophils Relative %: 75 %
Platelets: 133 10*3/uL — ABNORMAL LOW (ref 150–400)
RBC: 3.37 MIL/uL — ABNORMAL LOW (ref 3.87–5.11)
RDW: 15.5 % (ref 11.5–15.5)
WBC: 3.9 10*3/uL — ABNORMAL LOW (ref 4.0–10.5)
nRBC: 0 % (ref 0.0–0.2)

## 2020-04-26 LAB — BRAIN NATRIURETIC PEPTIDE: B Natriuretic Peptide: 411.2 pg/mL — ABNORMAL HIGH (ref 0.0–100.0)

## 2020-04-26 LAB — TROPONIN I (HIGH SENSITIVITY)
Troponin I (High Sensitivity): 7 ng/L (ref <18)
Troponin I (High Sensitivity): 10 ng/L (ref <18)

## 2020-04-26 MED ORDER — FUROSEMIDE 20 MG PO TABS: 20.0000 mg | ORAL_TABLET | Freq: Every day | ORAL | 0 refills | Status: DC

## 2020-04-26 MED ORDER — FUROSEMIDE 10 MG/ML IJ SOLN
20.0000 mg | Freq: Once | INTRAMUSCULAR | Status: AC
  Administered 2020-04-26: 20 mg via INTRAVENOUS
  Filled 2020-04-26: qty 4

## 2020-04-26 MED ORDER — POTASSIUM CHLORIDE CRYS ER 20 MEQ PO TBCR: 20.0000 meq | EXTENDED_RELEASE_TABLET | Freq: Every day | ORAL | 0 refills | Status: DC

## 2020-04-26 NOTE — Telephone Encounter (Signed)
Received call from patient's husband stating they have call 911 due to patient having SOB, wheezing with 02 Sat at 95%.  They are transporting to [facility] ED.  I have made Dr. Benay Spice aware.

## 2020-04-26 NOTE — Discharge Instructions (Addendum)
If you develop new or worsening shortness of breath, coughing up blood, fever, chest pain, or any other new/concerning symptoms, then return to the ER or call 911.

## 2020-04-26 NOTE — ED Notes (Signed)
Family at bedside. 

## 2020-04-26 NOTE — ED Provider Notes (Signed)
[facility] DEPT Provider Note   CSN: [id number] Arrival date & time: 04/26/20  0539     History Chief Complaint  Patient presents with  . Shortness of Breath    Pt states she feels like she can't get a good deep breath     Rebecca Weaver is a 70 y.o. female.  HPI 70 year old female presents with acute dyspnea.  She noticed some wheezing last night but then acutely felt worse this morning around 6 AM.  She has been taking her Advair but this feels different than typical COPD to her.  No cough or fever.  No chest pain.  Since being put on oxygen she feels like her shortness of breath has resolved.  She was never hypoxic, O2 sats were around 95% with EMS.  She just finished her second round of chemo about 4 days ago but was feeling fine up until last night.  She had an issue with leg swelling that seem to get better when she was put on doxycycline but last night her legs became swollen again.   Past Medical History:  Diagnosis Date  . Anemia   . Arrhythmia    Atrial fibrillation s/p TEE/DCCV 01/29/2020  . Arthritis   . COPD (chronic obstructive pulmonary disease) ([place])   . Coronary artery calcification   . Family history of breast cancer   . Family history of colon cancer   . GERD (gastroesophageal reflux disease)   . Hyperlipidemia   . Hypertension   . Lung nodules   . Osteopenia   . Pancreatic cancer ([place])   . Pseudotumor cerebri     Patient Active Problem List   Diagnosis Date Noted  . Genetic testing 03/16/2020  . Family history of breast cancer   . Family history of colon cancer   . Port-A-Cath in place 02/03/2020  . Atrial flutter ([place])   . Pancreatic adenocarcinoma ([place]) 01/28/2020  . Atrial flutter with rapid ventricular response ([place]) 12/30/2019  . Nicotine dependence, cigarettes, uncomplicated 76/73/4193  . Pancreatic lesion 12/30/2019  . Epigastric pain 12/30/2019  . Pulmonary nodules/lesions, multiple 12/30/2019  . COPD  (chronic obstructive pulmonary disease) ([place]) 12/30/2019  . Essential hypertension 12/30/2019  . Mixed hyperlipidemia 12/30/2019  . Atrial fibrillation with RVR ([place]) 12/30/2019    Past Surgical History:  Procedure Laterality Date  . CARDIOVERSION N/A 01/29/2020   Procedure: CARDIOVERSION;  Surgeon: Buford Dresser, MD;  Location: [facility] ENDOSCOPY;  Service: Cardiovascular;  Laterality: N/A;  . CHOLECYSTECTOMY    . ESOPHAGOGASTRODUODENOSCOPY (EGD) WITH PROPOFOL N/A 01/02/2020   Procedure: ESOPHAGOGASTRODUODENOSCOPY (EGD) WITH PROPOFOL;  Surgeon: [name], MD;  Location: [facility] ENDOSCOPY;  Service: Endoscopy;  Laterality: N/A;  . FINE NEEDLE ASPIRATION N/A 01/02/2020   Procedure: FINE NEEDLE ASPIRATION (FNA) LINEAR;  Surgeon: [name], MD;  Location: [facility] ENDOSCOPY;  Service: Endoscopy;  Laterality: N/A;  . IR IMAGING GUIDED PORT INSERTION  01/20/2020  . TEE WITHOUT CARDIOVERSION N/A 01/29/2020   Procedure: TRANSESOPHAGEAL ECHOCARDIOGRAM (TEE);  Surgeon: Buford Dresser, MD;  Location: [facility];  Service: Cardiovascular;  Laterality: N/A;  . TUBAL LIGATION    . UPPER ESOPHAGEAL ENDOSCOPIC ULTRASOUND (EUS) N/A 01/02/2020   Procedure: UPPER ESOPHAGEAL ENDOSCOPIC ULTRASOUND (EUS);  Surgeon: [name], MD;  Location: [facility] ENDOSCOPY;  Service: Endoscopy;  Laterality: N/A;  . VARICOSE VEIN SURGERY     multiple     OB History   No obstetric history on file.     Family History  Problem Relation Age of Onset  .  Varicose Veins Father   . Heart disease Father   . Varicose Veins Sister     Social History   Tobacco Use  . Smoking status: Current Every Day Smoker    Packs/day: 0.75    Years: 50.00    Pack years: 37.50    Types: Cigarettes  . Smokeless tobacco: Never Used  Substance Use Topics  . Alcohol use: Yes    Alcohol/week: 0.0 standard drinks    Comment: social occassions only  . Drug use: Not Currently    Home Medications Prior to Admission medications    Medication Sig Start Date End Date Taking? Authorizing Provider  furosemide (LASIX) 20 MG tablet Take 1 tablet (20 mg total) by mouth daily. 04/26/20  Yes Sherwood Gambler, MD  potassium chloride SA (KLOR-CON) 20 MEQ tablet Take 1 tablet (20 mEq total) by mouth daily. 04/26/20  Yes Sherwood Gambler, MD  acetaminophen (TYLENOL) 500 MG tablet Take 1,000 mg by mouth every 6 (six) hours as needed for moderate pain or headache.    [provider]  apixaban (ELIQUIS) 5 MG TABS tablet Take 1 tablet (5 mg total) by mouth 2 (two) times daily. Do not start this until you have the pancreatic biopsy done by GI. 03/01/20 08/28/20  O'Neal, Cassie Freer, MD  benazepril (LOTENSIN) 20 MG tablet Take 20 mg by mouth daily. 03/28/20   [provider]  Cyanocobalamin (B-12 COMPLIANCE INJECTION IJ) Inject 1 Dose as directed every 30 (thirty) days.    [provider]  diltiazem (CARDIZEM CD) 120 MG 24 hr capsule Take 1 capsule (120 mg total) by mouth daily. 03/01/20 08/28/20  O'NealCassie Freer, MD  doxycycline (VIBRA-TABS) 100 MG tablet Take 1 tablet (100 mg total) by mouth 2 (two) times daily. For 7 days 04/15/20   Ladell Pier, MD  ezetimibe (ZETIA) 10 MG tablet Take 10 mg by mouth daily.    [provider]  famotidine (PEPCID) 20 MG tablet Take 20 mg by mouth daily.    [provider]  fluticasone (FLONASE) 50 MCG/ACT nasal spray Place 1 spray into both nostrils 2 (two) times daily.     [provider]  Fluticasone-Salmeterol (ADVAIR) 250-50 MCG/DOSE AEPB Inhale 1 puff into the lungs 2 (two) times daily.    [provider]  lidocaine-prilocaine (EMLA) cream Apply 1 application topically as directed. Apply 1 hour prior to port access and cover with plastic wrap 01/28/20   Ladell Pier, MD  loratadine (CLARITIN) 10 MG tablet Take 10 mg by mouth daily.    [provider]  LORazepam (ATIVAN) 0.5 MG tablet Take 1 tablet (0.5 mg total) by mouth at  bedtime as needed for sleep. 03/31/20   Ladell Pier, MD  morphine (MS CONTIN) 15 MG 12 hr tablet Take 1 tablet (15 mg total) by mouth every 12 (twelve) hours. 04/08/20   Tanner, Lyndon Code., PA-C  Naphazoline-Pheniramine (VISINE-A OP) Place 1 drop into both eyes daily.    [provider]  ondansetron (ZOFRAN) 8 MG tablet Take 1 tablet (8 mg total) by mouth every 8 (eight) hours as needed for nausea or vomiting. 01/28/20   Ladell Pier, MD  oxyCODONE (OXY IR/ROXICODONE) 5 MG immediate release tablet 1 to 3 PO Q 4 hours prn pain 04/08/20   Tanner, Lyndon Code., PA-C  prochlorperazine (COMPAZINE) 10 MG tablet Take 1 tablet (10 mg total) by mouth every 6 (six) hours as needed for nausea. 01/28/20   Ladell Pier, MD  Allergies    Atorvastatin, Ciprofloxacin, and Sulfa antibiotics  Review of Systems   Review of Systems  Constitutional: Negative for fever.  Respiratory: Positive for shortness of breath and wheezing. Negative for cough.   Cardiovascular: Positive for leg swelling. Negative for chest pain.  All other systems reviewed and are negative.   Physical Exam Updated Vital Signs BP 138/72   Pulse 73   Temp 98 F (36.7 C) (Oral)   Resp 18   Ht 5\' 7"  (1.702 m)   Wt 59 kg   SpO2 93%   BMI 20.36 kg/m   Physical Exam Vitals and nursing note reviewed.  Constitutional:      General: She is not in acute distress.    Appearance: She is well-developed and well-nourished. She is not ill-appearing or diaphoretic.  HENT:     Head: Normocephalic and atraumatic.     Right Ear: External ear normal.     Left Ear: External ear normal.     Nose: Nose normal.  Eyes:     General:        Right eye: No discharge.        Left eye: No discharge.  Cardiovascular:     Rate and Rhythm: Normal rate and regular rhythm.     Heart sounds: Normal heart sounds.  Pulmonary:     Effort: Pulmonary effort is normal. No tachypnea or accessory muscle usage.     Breath sounds: Normal breath  sounds. No wheezing.  Abdominal:     Palpations: Abdomen is soft.     Tenderness: There is no abdominal tenderness.  Musculoskeletal:     Right lower leg: Edema present.     Left lower leg: Edema present.     Comments: Bilateral pitting edema to lower legs and feet  Skin:    General: Skin is warm and dry.  Neurological:     Mental Status: She is alert.  Psychiatric:        Mood and Affect: Mood is not anxious.     ED Results / Procedures / Treatments   Labs (all labs ordered are listed, but only abnormal results are displayed) Labs Reviewed  COMPREHENSIVE METABOLIC PANEL - Abnormal; Notable for the following components:      Result Value   Glucose, Bld 109 (*)    Calcium 8.5 (*)    Total Protein 5.4 (*)    Albumin 3.2 (*)    Total Bilirubin 1.3 (*)    All other components within normal limits  BRAIN NATRIURETIC PEPTIDE - Abnormal; Notable for the following components:   B Natriuretic Peptide 411.2 (*)    All other components within normal limits  CBC WITH DIFFERENTIAL/PLATELET - Abnormal; Notable for the following components:   WBC 3.9 (*)    RBC 3.37 (*)    Hemoglobin 11.1 (*)    HCT 34.5 (*)    MCV 102.4 (*)    Platelets 133 (*)    All other components within normal limits  SARS CORONAVIRUS 2 (TAT 6-24 HRS)  POC SARS CORONAVIRUS 2 AG -  ED  TROPONIN I (HIGH SENSITIVITY)  TROPONIN I (HIGH SENSITIVITY)    EKG EKG Interpretation  Date/Time:  Monday April 26 2020 10:36:17 EST Ventricular Rate:  67 PR Interval:    QRS Duration: 111 QT Interval:  424 QTC Calculation: 448 R Axis:   157 Text Interpretation: Sinus rhythm Atrial premature complexes Probable RVH w/ secondary repol abnormality Nonspecific T abnormalities, lateral leads T wave changes similar to  prior Confirmed by Sherwood Gambler [phone]) on 04/26/2020 10:56:01 AM   Radiology DG Chest 2 View  Result Date: 04/26/2020 CLINICAL DATA:  Dyspnea and COPD. Pancreatic adenocarcinoma. Pulmonary nodules.  EXAM: CHEST - 2 VIEW COMPARISON:  12/29/2019 and PET-CT of 01/21/2020 FINDINGS: The known right upper lobe pulmonary nodule at about the vertical level of the Port-A-Cath port is faintly seen. Underlying emphysema. Power injectable Port-A-Cath tip: SVC. Atherosclerotic calcification of the aortic arch. Mild biapical pleuroparenchymal scarring. Lower thoracic and upper lumbar spondylosis. IMPRESSION: 1. Known right upper lobe pulmonary nodule is faintly seen. 2. Aortic Atherosclerosis (ICD10-I70.0) and Emphysema (ICD10-J43.9). Electronically Signed   By: Van Clines M.D.   On: 04/26/2020 10:03    Procedures Procedures   Medications Ordered in ED Medications  furosemide (LASIX) injection 20 mg (20 mg Intravenous Given 04/26/20 1334)    ED Course  I have reviewed the triage vital signs and the nursing notes.  Pertinent labs & imaging results that were available during my care of the patient were reviewed by me and considered in my medical decision making (see chart for details).    MDM Rules/Calculators/A&P                          By the time the patient is in the emergency department and I am seeing her, her symptoms seem to have resolved.  Her chest x-ray is fairly unremarkable compared to baseline and her troponins are negative x2 as well as her CBC and metabolic panel are unremarkable.  She does have a nonspecifically elevated BNP and with her leg swelling I think is reasonable to give her a short course of Lasix.  She does have a mildly depressed EF on most recent echo.  However no pulmonary edema or hypoxia when she walked.  She feels well enough for discharge.  While she does have cancer I did briefly consider PE but given her symptoms completely resolved and she is not tachycardic, hypoxic, etc., I think that this is less likely.  She will follow-up with PCP and oncology.  We discussed return precautions.  We will send outpatient COVID testing but this is not typical for COVID  either. Final Clinical Impression(s) / ED Diagnoses Final diagnoses:  Shortness of breath  Peripheral edema    Rx / DC Orders ED Discharge Orders         Ordered    furosemide (LASIX) 20 MG tablet  Daily        04/26/20 1506    potassium chloride SA (KLOR-CON) 20 MEQ tablet  Daily        04/26/20 1507           Sherwood Gambler, MD 04/26/20 1610

## 2020-04-26 NOTE — ED Notes (Signed)
While walking to restroom, pt's sats remained 94-95% on room air - pt denies shortness of breath

## 2020-04-27 ENCOUNTER — Other Ambulatory Visit: Payer: Self-pay | Admitting: Oncology

## 2020-04-27 ENCOUNTER — Telehealth: Payer: Self-pay

## 2020-04-27 DIAGNOSIS — G893 Neoplasm related pain (acute) (chronic): Secondary | ICD-10-CM

## 2020-04-27 MED ORDER — OXYCODONE HCL 5 MG PO TABS: ORAL_TABLET | ORAL | 0 refills | Status: DC

## 2020-04-27 MED ORDER — MORPHINE SULFATE ER 15 MG PO TBCR: 15.0000 mg | EXTENDED_RELEASE_TABLET | Freq: Two times a day (BID) | ORAL | 0 refills | Status: DC

## 2020-04-27 NOTE — Telephone Encounter (Signed)
Patient's husband calls requesting refills on MS Contin and Oxycodone.  He also states she is having a lot of gas and the Gas-X is really not helping.  She was sent home from the hospital with Furosemide and Potassium for 7 days.  Her bowels are not working well and she has taken Senokot to try to get them to move.

## 2020-05-01 ENCOUNTER — Encounter: Payer: Self-pay | Admitting: Hematology

## 2020-05-02 ENCOUNTER — Other Ambulatory Visit: Payer: Self-pay | Admitting: Oncology

## 2020-05-03 ENCOUNTER — Telehealth: Payer: Self-pay | Admitting: *Deleted

## 2020-05-03 NOTE — Telephone Encounter (Signed)
Daughter left VM on navigator phone on Saturday regarding pain crisis. Called patient this morning and she reports her pain was more severe over the weekend rated 8/10 located in epigastric area and radiated to back. Has been on MS Contin 30 mg and taking oxycodone 10 mg every 4 hours ATC. No nausea or fever. Bowels are working well. Today she rates pain ~ 6/10 and thinks she will be OK until her appointment on 2/24 to discuss pain management. Informed her that she can take 15 mg of the oxycodone q4 hours prn. She will call if condition changes before 2/24. Instructed patient to call collaborative RN for all her medical issues and not the GI Navigator and reminded her that no one answers phone in office over the weekend.

## 2020-05-04 ENCOUNTER — Other Ambulatory Visit: Payer: Self-pay | Admitting: *Deleted

## 2020-05-04 DIAGNOSIS — C259 Malignant neoplasm of pancreas, unspecified: Secondary | ICD-10-CM

## 2020-05-06 ENCOUNTER — Inpatient Hospital Stay: Payer: Medicare Other

## 2020-05-06 ENCOUNTER — Other Ambulatory Visit: Payer: Self-pay

## 2020-05-06 ENCOUNTER — Inpatient Hospital Stay (HOSPITAL_BASED_OUTPATIENT_CLINIC_OR_DEPARTMENT_OTHER): Payer: Medicare Other | Admitting: Oncology

## 2020-05-06 VITALS — BP 138/81 | HR 78 | Temp 97.7°F | Resp 16 | Ht 67.0 in | Wt 119.7 lb

## 2020-05-06 DIAGNOSIS — C259 Malignant neoplasm of pancreas, unspecified: Secondary | ICD-10-CM | POA: Diagnosis not present

## 2020-05-06 DIAGNOSIS — G893 Neoplasm related pain (acute) (chronic): Secondary | ICD-10-CM | POA: Diagnosis not present

## 2020-05-06 DIAGNOSIS — Z5111 Encounter for antineoplastic chemotherapy: Secondary | ICD-10-CM | POA: Diagnosis not present

## 2020-05-06 DIAGNOSIS — Z95828 Presence of other vascular implants and grafts: Secondary | ICD-10-CM

## 2020-05-06 LAB — CBC WITH DIFFERENTIAL (CANCER CENTER ONLY)
Abs Immature Granulocytes: 0.02 10*3/uL (ref 0.00–0.07)
Basophils Absolute: 0 10*3/uL (ref 0.0–0.1)
Basophils Relative: 1 %
Eosinophils Absolute: 0.1 10*3/uL (ref 0.0–0.5)
Eosinophils Relative: 3 %
HCT: 32.3 % — ABNORMAL LOW (ref 36.0–46.0)
Hemoglobin: 10.9 g/dL — ABNORMAL LOW (ref 12.0–15.0)
Immature Granulocytes: 1 %
Lymphocytes Relative: 17 %
Lymphs Abs: 0.7 10*3/uL (ref 0.7–4.0)
MCH: 32.9 pg (ref 26.0–34.0)
MCHC: 33.7 g/dL (ref 30.0–36.0)
MCV: 97.6 fL (ref 80.0–100.0)
Monocytes Absolute: 0.9 10*3/uL (ref 0.1–1.0)
Monocytes Relative: 21 %
Neutro Abs: 2.4 10*3/uL (ref 1.7–7.7)
Neutrophils Relative %: 57 %
Platelet Count: 174 10*3/uL (ref 150–400)
RBC: 3.31 MIL/uL — ABNORMAL LOW (ref 3.87–5.11)
RDW: 14.6 % (ref 11.5–15.5)
WBC Count: 4.1 10*3/uL (ref 4.0–10.5)
nRBC: 0 % (ref 0.0–0.2)

## 2020-05-06 LAB — CMP (CANCER CENTER ONLY)
ALT: 9 U/L (ref 0–44)
AST: 13 U/L — ABNORMAL LOW (ref 15–41)
Albumin: 3.1 g/dL — ABNORMAL LOW (ref 3.5–5.0)
Alkaline Phosphatase: 78 U/L (ref 38–126)
Anion gap: 11 (ref 5–15)
BUN: 8 mg/dL (ref 8–23)
CO2: 26 mmol/L (ref 22–32)
Calcium: 8.7 mg/dL — ABNORMAL LOW (ref 8.9–10.3)
Chloride: 101 mmol/L (ref 98–111)
Creatinine: 0.62 mg/dL (ref 0.44–1.00)
GFR, Estimated: >60 mL/min (ref >60)
Glucose, Bld: 146 mg/dL — ABNORMAL HIGH (ref 70–99)
Potassium: 3.5 mmol/L (ref 3.5–5.1)
Sodium: 138 mmol/L (ref 135–145)
Total Bilirubin: 0.3 mg/dL (ref 0.3–1.2)
Total Protein: 5.7 g/dL — ABNORMAL LOW (ref 6.5–8.1)

## 2020-05-06 MED ORDER — MORPHINE SULFATE ER 60 MG PO TBCR: 60.0000 mg | EXTENDED_RELEASE_TABLET | Freq: Two times a day (BID) | ORAL | 0 refills | Status: DC

## 2020-05-06 MED ORDER — LEUCOVORIN CALCIUM INJECTION 350 MG
400.0000 mg/m2 | Freq: Once | INTRAMUSCULAR | Status: AC
  Administered 2020-05-06: 640 mg via INTRAVENOUS
  Filled 2020-05-06: qty 32

## 2020-05-06 MED ORDER — ATROPINE SULFATE 1 MG/ML IJ SOLN
INTRAMUSCULAR | Status: AC
  Filled 2020-05-06: qty 1

## 2020-05-06 MED ORDER — SODIUM CHLORIDE 0.9 % IV SOLN
150.0000 mg | Freq: Once | INTRAVENOUS | Status: AC
  Administered 2020-05-06: 150 mg via INTRAVENOUS
  Filled 2020-05-06: qty 150

## 2020-05-06 MED ORDER — DEXTROSE 5 % IV SOLN
Freq: Once | INTRAVENOUS | Status: AC
  Filled 2020-05-06: qty 250

## 2020-05-06 MED ORDER — PALONOSETRON HCL INJECTION 0.25 MG/5ML
0.2500 mg | Freq: Once | INTRAVENOUS | Status: AC
  Administered 2020-05-06: 0.25 mg via INTRAVENOUS

## 2020-05-06 MED ORDER — SODIUM CHLORIDE 0.9% FLUSH
10.0000 mL | INTRAVENOUS | Status: DC | PRN
  Administered 2020-05-06: 10 mL
  Filled 2020-05-06: qty 10

## 2020-05-06 MED ORDER — PALONOSETRON HCL INJECTION 0.25 MG/5ML
INTRAVENOUS | Status: AC
  Filled 2020-05-06: qty 5

## 2020-05-06 MED ORDER — OXYCODONE HCL 5 MG PO TABS: ORAL_TABLET | ORAL | 0 refills | Status: DC

## 2020-05-06 MED ORDER — OXALIPLATIN CHEMO INJECTION 100 MG/20ML
85.0000 mg/m2 | Freq: Once | INTRAVENOUS | Status: AC
  Administered 2020-05-06: 135 mg via INTRAVENOUS
  Filled 2020-05-06: qty 27

## 2020-05-06 MED ORDER — SODIUM CHLORIDE 0.9 % IV SOLN
150.0000 mg/m2 | Freq: Once | INTRAVENOUS | Status: AC
  Administered 2020-05-06: 240 mg via INTRAVENOUS
  Filled 2020-05-06: qty 12

## 2020-05-06 MED ORDER — SODIUM CHLORIDE 0.9 % IV SOLN
10.0000 mg | Freq: Once | INTRAVENOUS | Status: AC
  Administered 2020-05-06: 10 mg via INTRAVENOUS
  Filled 2020-05-06: qty 10

## 2020-05-06 MED ORDER — ATROPINE SULFATE 1 MG/ML IJ SOLN
0.5000 mg | Freq: Once | INTRAMUSCULAR | Status: AC | PRN
  Administered 2020-05-06: 0.5 mg via INTRAVENOUS

## 2020-05-06 MED ORDER — SODIUM CHLORIDE 0.9% FLUSH
10.0000 mL | Freq: Once | INTRAVENOUS | Status: AC
  Administered 2020-05-06: 10 mL
  Filled 2020-05-06: qty 10

## 2020-05-06 MED ORDER — SODIUM CHLORIDE 0.9 % IV SOLN
400.0000 mg/m2 | Freq: Once | INTRAVENOUS | Status: DC
  Filled 2020-05-06: qty 32

## 2020-05-06 MED ORDER — SODIUM CHLORIDE 0.9 % IV SOLN
2000.0000 mg/m2 | INTRAVENOUS | Status: DC
  Administered 2020-05-06: 3200 mg via INTRAVENOUS
  Filled 2020-05-06: qty 64

## 2020-05-06 NOTE — Progress Notes (Signed)
[facility] OFFICE PROGRESS NOTE   Diagnosis: Pancreas cancer  INTERVAL HISTORY:   Ms. Loos completed cycle 1 FOLFOX 04/22/2020.  No nausea/vomiting, mouth sores, diarrhea, or neuropathy symptoms.  She reports fatigue.  She was seen in the emergency room with dyspnea on 04/26/2020.  She had increased leg swelling.  She was placed on furosemide for 5 days and the edema improved.  Her chief complaint is abdominal pain.  She is currently taking MS Contin at a dose of 30 mg twice daily.  She takes 10 to 15 mg of oxycodone every 4 hours.  Her pain is not controlled on this regimen.  Objective:  Vital signs in last 24 hours:  Blood pressure 138/81, pulse 78, temperature 97.7 F (36.5 C), temperature source Tympanic, resp. rate 16, height 5\' 7"  (1.702 m), weight 119 lb 11.2 oz (54.3 kg), SpO2 99 %.    HEENT: No thrush or ulcers Resp: Distant breath sounds Cardio: Regular rate and rhythm GI: Tender in the mid and right upper abdomen, no mass, no hepatomegaly Vascular: Pitting edema at the left greater than right lower leg    Portacath/PICC-without erythema  Lab Results:  Lab Results  Component Value Date   WBC 4.1 05/06/2020   HGB 10.9 (L) 05/06/2020   HCT 32.3 (L) 05/06/2020   MCV 97.6 05/06/2020   PLT 174 05/06/2020   NEUTROABS 2.4 05/06/2020    CMP  Lab Results  Component Value Date   NA 138 04/26/2020   K 3.5 04/26/2020   CL 101 04/26/2020   CO2 29 04/26/2020   GLUCOSE 109 (H) 04/26/2020   BUN 9 04/26/2020   CREATININE 0.47 04/26/2020   CALCIUM 8.5 (L) 04/26/2020   PROT 5.4 (L) 04/26/2020   ALBUMIN 3.2 (L) 04/26/2020   AST 18 04/26/2020   ALT 15 04/26/2020   ALKPHOS 58 04/26/2020   BILITOT 1.3 (H) 04/26/2020   GFRNONAA >60 04/26/2020   GFRAA 97 01/26/2020    Medications: I have reviewed the patient's current medications.   Assessment/Plan: 1. Pancreas cancer, adenocarcinoma the pancreas body, status post a CT-guided biopsy  01/09/2020 ? CTs 12/29/2019-pancreas body mass adjacent to the SMA, 11 mm right upper lobe nodule and 6 mm left upper lobe nodule as well as other tiny lung nodules new compared to a CT from 11/21/2017.  Upper normal right hilar and subcarinal nodes ? 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 EUS 01/02/2020-irregular pancreas body mass with poorly defined borders, lack of portal vein invasion, FNA not possible, no evidence of invasion into the portal vein ? PET 01/21/2020-hypermetabolic pancreas body/neck mass, hypermetabolic 1 cm right upper lobe nodule, other small pulmonary nodules below PET resolution ? Cycle 1 gemcitabine/Abraxane 02/06/2020 ? Cycle 2 gemcitabine/Abraxane 02/20/2020, Aloxi/Decadron added to the antiemetic regimen ? Cycle 3 gemcitabine/Abraxane 03/03/2020 ? Cycle 4 gemcitabine/abraxane 03/18/2020  ? CT abdomen/pelvis 03/31/2020- increase in size of the pancreas body/tail mass with progressive encasement of the portal venous confluence, posterior extension to involve the SMA without complete encasement, no evidence of metastatic disease ? Cycle 5 gemcitabine/Abraxane 04/01/2020 ? Cycle 1 FOLFIRINOX 04/21/2020 ? Cycle 2 FOLFIRINOX 05/06/2020 2.   Abdomen/back pain secondary to #1 3.   Anorexia/weight loss secondary to #1 4.  Atrial flutter 5.   COPD 6.   Port-A-Cath placement 01/20/2020 7.  Constipation, secondary to #1, managed with Senokot and MiraLAX 8. Stye of left eye 03/18/2020 9.  Increased left leg edema with erythema-negative Doppler 04/09/2020, course of doxycycline prescribed 04/15/2020     Disposition: She has completed 1 cycle of FOLFIRINOX.  She tolerated the chemotherapy well, but she continues to have severe abdominal pain.   Leg edema improved partially with a course of furosemide.  I adjusted the chemotherapy doses for the recent weight loss.  She will complete cycle 2 FOLFIRINOX today.  We adjusted the narcotic pain regimen.  She will begin MS Contin at a dose of 60 mg twice daily.  She will continue oxycodone (10-15 mg) as needed for breakthrough pain.  Ms. Sager will return for an office visit and chemotherapy in 2 weeks.  We will see her sooner as needed.  The plan is to obtain a restaging CT after 4-5 cycles of FOLFIRINOX.  We will consider referring her for radiation if her pain does not improve.  Betsy Coder, MD  05/06/2020  10:46 AM

## 2020-05-06 NOTE — Patient Instructions (Signed)
[facility] Discharge Instructions for Patients Receiving Chemotherapy  Today you received the following chemotherapy agents Oxaliplatin. Irinotecan, Leucovorin, Fluorouracil.  To help prevent nausea and vomiting after your treatment, we encourage you to take your nausea medication as directed.   If you develop nausea and vomiting that is not controlled by your nausea medication, call the clinic.   BELOW ARE SYMPTOMS THAT SHOULD BE REPORTED IMMEDIATELY:  *FEVER GREATER THAN 100.5 F  *CHILLS WITH OR WITHOUT FEVER  NAUSEA AND VOMITING THAT IS NOT CONTROLLED WITH YOUR NAUSEA MEDICATION  *UNUSUAL SHORTNESS OF BREATH  *UNUSUAL BRUISING OR BLEEDING  TENDERNESS IN MOUTH AND THROAT WITH OR WITHOUT PRESENCE OF ULCERS  *URINARY PROBLEMS  *BOWEL PROBLEMS  UNUSUAL RASH Items with * indicate a potential emergency and should be followed up as soon as possible.  Feel free to call the clinic should you have any questions or concerns. The clinic phone number is [phone].  Please show the [facility] at check-in to the Emergency Department and triage nurse.

## 2020-05-06 NOTE — Patient Instructions (Signed)

## 2020-05-07 ENCOUNTER — Telehealth: Payer: Self-pay | Admitting: Oncology

## 2020-05-07 NOTE — Telephone Encounter (Signed)
Scheduled appointments per 2/24 los. Called patient, no answer. Left message with appointments date and times.

## 2020-05-08 ENCOUNTER — Other Ambulatory Visit: Payer: Self-pay

## 2020-05-08 ENCOUNTER — Inpatient Hospital Stay: Payer: Medicare Other

## 2020-05-08 VITALS — BP 133/69 | HR 69 | Temp 97.5°F | Resp 18

## 2020-05-08 DIAGNOSIS — Z5111 Encounter for antineoplastic chemotherapy: Secondary | ICD-10-CM | POA: Diagnosis not present

## 2020-05-08 DIAGNOSIS — Z95828 Presence of other vascular implants and grafts: Secondary | ICD-10-CM

## 2020-05-08 MED ORDER — HEPARIN SOD (PORK) LOCK FLUSH 100 UNIT/ML IV SOLN
500.0000 [IU] | Freq: Once | INTRAVENOUS | Status: AC
Start: 2020-05-08 — End: 2020-05-08
  Administered 2020-05-08: 500 [IU]
  Filled 2020-05-08: qty 5

## 2020-05-08 MED ORDER — SODIUM CHLORIDE 0.9% FLUSH
10.0000 mL | Freq: Once | INTRAVENOUS | Status: AC
  Administered 2020-05-08: 10 mL
  Filled 2020-05-08: qty 10

## 2020-05-13 ENCOUNTER — Telehealth: Payer: Self-pay | Admitting: *Deleted

## 2020-05-13 ENCOUNTER — Telehealth: Payer: Self-pay

## 2020-05-13 MED ORDER — DIPHENOXYLATE-ATROPINE 2.5-0.025 MG PO TABS: 1.0000 | ORAL_TABLET | Freq: Four times a day (QID) | ORAL | 1 refills | Status: AC | PRN

## 2020-05-13 NOTE — Telephone Encounter (Signed)
Nutrition  Received message from daughter, Sherre Poot regarding questions for RD.    RD returned call. No answer. Left message with call back number.   Eugenio Dollins B. Zenia Resides, [place], [place] Registered Dietitian [phone] (mobile)

## 2020-05-13 NOTE — Telephone Encounter (Signed)
Nutrition Follow-up:  Daughter, Guerry Minors called RD back.  Wanting to know if patient can try an infant premie formula.  Reports since 2 days after chemotherapy her appetite has been poor, reports diarrhea, nausea, vomiting.  Reports intake has been only water for few days. Today ate 1/4 bread and water and had diarrhea.  3 episodes of diarrhea today that required help getting cleaned up.  Daughter unsure if she had more episodes of diarrhea that she handled on her own.  Daughter reports that patient has taken imodium and following instructions on label.  Unsure if she is taking nausea medications.    Medications: zofran, ativan   Anthropometrics:   Weight 119 lb on 2/24 decreased from 130 lb 2/14   NUTRITION DIAGNOSIS: Inadequate oral intake continues    INTERVENTION:  Message sent to Manuela Schwartz, RN regarding patient's symptoms.   Discussed with daughter that RD would contact Dr Gearldine Shown team regarding patient's symptoms to try and get them better under control.  RD explained if can get symptoms under control then hopefully patient will be able to eat more.  Infant formulas not appropriate to meet the needs of adults.     MONITORING, EVALUATION, GOAL: weight trends, intake   NEXT VISIT: March 10 during infusion if not before  Lavena Loretto B. Zenia Resides, [place], [place] Registered Dietitian [phone] (mobile)

## 2020-05-13 NOTE — Telephone Encounter (Signed)
Notified by dietician that she was having problems at home w/diarrhea to point of incontinence. Not eating and barely taking fluids. Has vomiting, frequently without any nausea. Feels like she has a lump in her throat at times. Has only been taking OTC Imodium per package directions and unsure of how much--using a liquid preparation and does not know the mg/ml.  Will send in script for Lomotil per Dr. Benay Spice and see her with IV fluids tomorrow at 1130.

## 2020-05-14 ENCOUNTER — Ambulatory Visit: Payer: Medicare Other

## 2020-05-14 ENCOUNTER — Telehealth (HOSPITAL_COMMUNITY): Payer: Self-pay

## 2020-05-14 ENCOUNTER — Other Ambulatory Visit: Payer: Self-pay

## 2020-05-14 ENCOUNTER — Other Ambulatory Visit: Payer: Self-pay | Admitting: *Deleted

## 2020-05-14 ENCOUNTER — Telehealth: Payer: Self-pay

## 2020-05-14 ENCOUNTER — Inpatient Hospital Stay: Payer: Medicare Other | Attending: Oncology | Admitting: Oncology

## 2020-05-14 VITALS — BP 116/71 | HR 72 | Temp 98.7°F | Resp 16 | Ht 67.0 in | Wt 117.2 lb

## 2020-05-14 DIAGNOSIS — R630 Anorexia: Secondary | ICD-10-CM | POA: Diagnosis not present

## 2020-05-14 DIAGNOSIS — R197 Diarrhea, unspecified: Secondary | ICD-10-CM | POA: Diagnosis not present

## 2020-05-14 DIAGNOSIS — Z95828 Presence of other vascular implants and grafts: Secondary | ICD-10-CM

## 2020-05-14 DIAGNOSIS — G893 Neoplasm related pain (acute) (chronic): Secondary | ICD-10-CM | POA: Diagnosis not present

## 2020-05-14 DIAGNOSIS — C251 Malignant neoplasm of body of pancreas: Secondary | ICD-10-CM | POA: Insufficient documentation

## 2020-05-14 DIAGNOSIS — C259 Malignant neoplasm of pancreas, unspecified: Secondary | ICD-10-CM

## 2020-05-14 DIAGNOSIS — Z5111 Encounter for antineoplastic chemotherapy: Secondary | ICD-10-CM | POA: Insufficient documentation

## 2020-05-14 LAB — CMP (CANCER CENTER ONLY)
ALT: 8 U/L (ref 0–44)
AST: 11 U/L — ABNORMAL LOW (ref 15–41)
Albumin: 2.5 g/dL — ABNORMAL LOW (ref 3.5–5.0)
Alkaline Phosphatase: 67 U/L (ref 38–126)
Anion gap: 8 (ref 5–15)
BUN: 8 mg/dL (ref 8–23)
CO2: 24 mmol/L (ref 22–32)
Calcium: 7.9 mg/dL — ABNORMAL LOW (ref 8.9–10.3)
Chloride: 104 mmol/L (ref 98–111)
Creatinine: 0.58 mg/dL (ref 0.44–1.00)
GFR, Estimated: >60 mL/min (ref >60)
Glucose, Bld: 89 mg/dL (ref 70–99)
Potassium: 3.6 mmol/L (ref 3.5–5.1)
Sodium: 136 mmol/L (ref 135–145)
Total Bilirubin: 0.3 mg/dL (ref 0.3–1.2)
Total Protein: 4.7 g/dL — ABNORMAL LOW (ref 6.5–8.1)

## 2020-05-14 LAB — MAGNESIUM: Magnesium: 1.5 mg/dL — ABNORMAL LOW (ref 1.7–2.4)

## 2020-05-14 MED ORDER — SODIUM CHLORIDE 0.9% FLUSH
10.0000 mL | INTRAVENOUS | Status: DC | PRN
  Administered 2020-05-14: 10 mL via INTRAVENOUS
  Filled 2020-05-14: qty 10

## 2020-05-14 MED ORDER — SODIUM CHLORIDE 0.9 % IV SOLN
INTRAVENOUS | Status: DC
  Filled 2020-05-14 (×2): qty 250

## 2020-05-14 MED ORDER — HEPARIN SOD (PORK) LOCK FLUSH 100 UNIT/ML IV SOLN
500.0000 [IU] | Freq: Once | INTRAVENOUS | Status: AC
  Administered 2020-05-14: 500 [IU] via INTRAVENOUS
  Filled 2020-05-14: qty 5

## 2020-05-14 NOTE — Progress Notes (Signed)
IV fluid orders placed for 05/17/20

## 2020-05-14 NOTE — Telephone Encounter (Signed)
Nutrition  Called Guerry Minors, patient's daughter for nutrition follow-up.  No answer. Left message with call back number.  Noted patient seen today at [facility] by Dr Benay Spice.  RD will provided follow-up on 3/10 if not sooner.    Kimberlyann Hollar B. Zenia Resides, [place], [place] Registered Dietitian [phone] (mobile)

## 2020-05-14 NOTE — Telephone Encounter (Signed)
Nutrition  Daughter Guerry Minors called RD back.    Will email handout on nausea and vomiting and diarrhea to daughter.  Daughter has found and read through Eating Hints that was given to patient at chemo education.  She is appreciative of information.   Adaly Puder B. Zenia Resides, [place], [place] Registered Dietitian [phone] (mobile)

## 2020-05-14 NOTE — Progress Notes (Signed)
[place] OFFICE PROGRESS NOTE   Diagnosis: Pancreas cancer  INTERVAL HISTORY:    Rebecca Weaver completed another cycle FOLFIRINOX on 05/06/2020.  She developed diarrhea beginning a few days after chemotherapy.  The diarrhea has persisted.  She has 3 large volume loose stools per day.  She is taking Imodium several times per day.  She reports 2 episodes of emesis this week.  No nausea at present.  No bleeding.  The abdominal pain is under better control at the current dose of MS Contin.  She takes oxycodone every 4-5 hours.  Objective:  Vital signs in last 24 hours:  Blood pressure 123/90, pulse 91, temperature 98.7 F (37.1 C), temperature source Tympanic, resp. rate 20, height 5\' 7"  (1.702 m), weight 117 lb 3.2 oz (53.2 kg), SpO2 100 %.    HEENT: The mucous membranes are dry, no thrush or ulcers Resp: Distant breath sounds, no respiratory distress Cardio: Irregular GI: Soft, tender in the mid upper abdomen, no mass, no hepatomegaly Vascular: 1+ pitting edema of the left greater than right lower leg    Portacath/PICC-without erythema  Lab Results:  Lab Results  Component Value Date   WBC 4.1 05/06/2020   HGB 10.9 (L) 05/06/2020   HCT 32.3 (L) 05/06/2020   MCV 97.6 05/06/2020   PLT 174 05/06/2020   NEUTROABS 2.4 05/06/2020    CMP  Lab Results  Component Value Date   NA 138 05/06/2020   K 3.5 05/06/2020   CL 101 05/06/2020   CO2 26 05/06/2020   GLUCOSE 146 (H) 05/06/2020   BUN 8 05/06/2020   CREATININE 0.62 05/06/2020   CALCIUM 8.7 (L) 05/06/2020   PROT 5.7 (L) 05/06/2020   ALBUMIN 3.1 (L) 05/06/2020   AST 13 (L) 05/06/2020   ALT 9 05/06/2020   ALKPHOS 78 05/06/2020   BILITOT 0.3 05/06/2020   GFRNONAA >60 05/06/2020   GFRAA 97 01/26/2020     Medications: I have reviewed the patient's current medications.   Assessment/Plan: 1. Pancreas cancer, adenocarcinoma the pancreas body, status post a CT-guided biopsy 01/09/2020 ? CTs  12/29/2019-pancreas body mass adjacent to the SMA, 11 mm right upper lobe nodule and 6 mm left upper lobe nodule as well as other tiny lung nodules new compared to a CT from 11/21/2017.  Upper normal right hilar and subcarinal nodes ? 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 EUS 01/02/2020-irregular pancreas body mass with poorly defined borders, lack of portal vein invasion, FNA not possible, no evidence of invasion into the portal vein ? PET 01/21/2020-hypermetabolic pancreas body/neck mass, hypermetabolic 1 cm right upper lobe nodule, other small pulmonary nodules below PET resolution ? Cycle 1 gemcitabine/Abraxane 02/06/2020 ? Cycle 2 gemcitabine/Abraxane 02/20/2020, Aloxi/Decadron added to the antiemetic regimen ? Cycle 3 gemcitabine/Abraxane 03/03/2020 ? Cycle 4 gemcitabine/abraxane 03/18/2020  ? CT abdomen/pelvis 03/31/2020- increase in size of the pancreas body/tail mass with progressive encasement of the portal venous confluence, posterior extension to involve the SMA without complete encasement, no evidence of metastatic disease ? Cycle 5 gemcitabine/Abraxane 04/01/2020 ? Cycle 1 FOLFIRINOX 04/21/2020 ? Cycle 2 FOLFIRINOX 05/06/2020 2.   Abdomen/back pain secondary to #1 3.   Anorexia/weight loss secondary to #1 4.   Atrial flutter 5.   COPD 6.  Port-A-Cath placement 01/20/2020 7.  Constipation, secondary to #1, managed with Senokot and MiraLAX 8. Stye of left eye 03/18/2020 9.  Increased left leg edema with erythema-negative Doppler 04/09/2020, course of doxycycline prescribed 04/15/2020 10.  Diarrhea following cycle 2 FOLFIRINOX   Disposition: Rebecca Weaver is now day 9 following cycle 2 FOLFIRINOX.  She presents with diarrhea and anorexia.  Her  symptoms are likely related to toxicity from chemotherapy.  She will receive intravenous fluids today.  We will reassess her status after the fluids and decide on the need for fluids tomorrow.  She will use Imodium and Lomotil as needed for diarrhea.  We will check a chemistry panel today.  She will return for an office visit as scheduled on 05/20/2020.  Chemotherapy will be dose adjusted with the next cycle.  Rebecca Coder, MD  05/14/2020  12:26 PM

## 2020-05-16 ENCOUNTER — Other Ambulatory Visit: Payer: Self-pay | Admitting: Oncology

## 2020-05-17 ENCOUNTER — Inpatient Hospital Stay: Payer: Medicare Other

## 2020-05-17 ENCOUNTER — Other Ambulatory Visit: Payer: Self-pay

## 2020-05-17 DIAGNOSIS — C259 Malignant neoplasm of pancreas, unspecified: Secondary | ICD-10-CM

## 2020-05-17 DIAGNOSIS — Z95828 Presence of other vascular implants and grafts: Secondary | ICD-10-CM

## 2020-05-17 DIAGNOSIS — Z5111 Encounter for antineoplastic chemotherapy: Secondary | ICD-10-CM | POA: Diagnosis not present

## 2020-05-17 MED ORDER — SODIUM CHLORIDE 0.9 % IV SOLN
INTRAVENOUS | Status: AC
  Filled 2020-05-17 (×2): qty 250

## 2020-05-17 MED ORDER — MAGNESIUM SULFATE 2 GM/50ML IV SOLN
2.0000 g | Freq: Once | INTRAVENOUS | Status: AC
  Administered 2020-05-17: 2 g via INTRAVENOUS

## 2020-05-17 MED ORDER — SODIUM CHLORIDE 0.9% FLUSH
10.0000 mL | Freq: Once | INTRAVENOUS | Status: AC
  Administered 2020-05-17: 10 mL
  Filled 2020-05-17: qty 10

## 2020-05-17 MED ORDER — MAGNESIUM SULFATE 2 GM/50ML IV SOLN
INTRAVENOUS | Status: AC
  Filled 2020-05-17: qty 50

## 2020-05-17 MED ORDER — HEPARIN SOD (PORK) LOCK FLUSH 100 UNIT/ML IV SOLN
500.0000 [IU] | Freq: Once | INTRAVENOUS | Status: AC
  Administered 2020-05-17: 500 [IU]
  Filled 2020-05-17: qty 5

## 2020-05-17 NOTE — Patient Instructions (Signed)
Dehydration, Adult Dehydration is condition in which there is not enough water or other fluids in the body. This happens when a person loses more fluids than he or she takes in. Important body parts cannot work right without the right amount of fluids. Any loss of fluids from the body can cause dehydration. Dehydration can be mild, worse, or very bad. It should be treated right away to keep it from getting very bad. What are the causes? This condition may be caused by:  Conditions that cause loss of water or other fluids, such as: ? Watery poop (diarrhea). ? Vomiting. ? Sweating a lot. ? Peeing (urinating) a lot.  Not drinking enough fluids, especially when you: ? Are ill. ? Are doing things that take a lot of energy to do.  Other illnesses and conditions, such as fever or infection.  Certain medicines, such as medicines that take extra fluid out of the body (diuretics).  Lack of safe drinking water.  Not being able to get enough water and food. What increases the risk? The following factors may make you more likely to develop this condition:  Having a long-term (chronic) illness that has not been treated the right way, such as: ? Diabetes. ? Heart disease. ? Kidney disease.  Being 65 years of age or older.  Having a disability.  Living in a place that is high above the ground or sea (high in altitude). The thinner, dried air causes more fluid loss.  Doing exercises that put stress on your body for a long time. What are the signs or symptoms? Symptoms of dehydration depend on how bad it is. Mild or worse dehydration  Thirst.  Dry lips or dry mouth.  Feeling dizzy or light-headed, especially when you stand up from sitting.  Muscle cramps.  Your body making: ? Dark pee (urine). Pee may be the color of tea. ? Less pee than normal. ? Less tears than normal.  Headache. Very bad dehydration  Changes in skin. Skin may: ? Be cold to the touch (clammy). ? Be blotchy  or pale. ? Not go back to normal right after you lightly pinch it and let it go.  Little or no tears, pee, or sweat.  Changes in vital signs, such as: ? Fast breathing. ? Low blood pressure. ? Weak pulse. ? Pulse that is more than 100 beats a minute when you are sitting still.  Other changes, such as: ? Feeling very thirsty. ? Eyes that look hollow (sunken). ? Cold hands and feet. ? Being mixed up (confused). ? Being very tired (lethargic) or having trouble waking from sleep. ? Short-term weight loss. ? Loss of consciousness. How is this treated? Treatment for this condition depends on how bad it is. Treatment should start right away. Do not wait until your condition gets very bad. Very bad dehydration is an emergency. You will need to go to a hospital.  Mild or worse dehydration can be treated at home. You may be asked to: ? Drink more fluids. ? Drink an oral rehydration solution (ORS). This drink helps get the right amounts of fluids and salts and minerals in the blood (electrolytes).  Very bad dehydration can be treated: ? With fluids through an IV tube. ? By getting normal levels of salts and minerals in your blood. This is often done by giving salts and minerals through a tube. The tube is passed through your nose and into your stomach. ? By treating the root cause. Follow these instructions at   home: Oral rehydration solution If told by your doctor, drink an ORS:  Make an ORS. Use instructions on the package.  Start by drinking small amounts, about  cup (120 mL) every 5-10 minutes.  Slowly drink more until you have had the amount that your doctor said to have. Eating and drinking  Drink enough clear fluid to keep your pee pale yellow. If you were told to drink an ORS, finish the ORS first. Then, start slowly drinking other clear fluids. Drink fluids such as: ? Water. Do not drink only water. Doing that can make the salt (sodium) level in your body get too low. ? Water  from ice chips you suck on. ? Fruit juice that you have added water to (diluted). ? Low-calorie sports drinks.  Eat foods that have the right amounts of salts and minerals, such as: ? Bananas. ? Oranges. ? Potatoes. ? Tomatoes. ? Spinach.  Do not drink alcohol.  Avoid: ? Drinks that have a lot of sugar. These include:  High-calorie sports drinks.  Fruit juice that you did not add water to.  Soda.  Caffeine. ? Foods that are greasy or have a lot of fat or sugar.         General instructions  Take over-the-counter and prescription medicines only as told by your doctor.  Do not take salt tablets. Doing that can make the salt level in your body get too high.  Return to your normal activities as told by your doctor. Ask your doctor what activities are safe for you.  Keep all follow-up visits as told by your doctor. This is important. Contact a doctor if:  You have pain in your belly (abdomen) and the pain: ? Gets worse. ? Stays in one place.  You have a rash.  You have a stiff neck.  You get angry or annoyed (irritable) more easily than normal.  You are more tired or have a harder time waking than normal.  You feel: ? Weak or dizzy. ? Very thirsty. Get help right away if you have:  Any symptoms of very bad dehydration.  Symptoms of vomiting, such as: ? You cannot eat or drink without vomiting. ? Your vomiting gets worse or does not go away. ? Your vomit has blood or green stuff in it.  Symptoms that get worse with treatment.  A fever.  A very bad headache.  Problems with peeing or pooping (having a bowel movement), such as: ? Watery poop that gets worse or does not go away. ? Blood in your poop (stool). This may cause poop to look black and tarry. ? Not peeing in 6-8 hours. ? Peeing only a small amount of very dark pee in 6-8 hours.  Trouble breathing. These symptoms may be an emergency. Do not wait to see if the symptoms will go away. Get  medical help right away. Call your local emergency services (911 in the [place] Do not drive yourself to the hospital. Summary  Dehydration is a condition in which there is not enough water or other fluids in the body. This happens when a person loses more fluids than he or she takes in.  Treatment for this condition depends on how bad it is. Treatment should be started right away. Do not wait until your condition gets very bad.  Drink enough clear fluid to keep your pee pale yellow. If you were told to drink an oral rehydration solution (ORS), finish the ORS first. Then, start slowly drinking other clear fluids.    Take over-the-counter and prescription medicines only as told by your doctor.  Get help right away if you have any symptoms of very bad dehydration. This information is not intended to replace advice given to you by your health care provider. Make sure you discuss any questions you have with your health care provider. Document Revised: 10/10/2018 Document Reviewed: 10/10/2018 Elsevier Patient Education  2021 [place].   Hypomagnesemia Hypomagnesemia is a condition in which the level of magnesium in the blood is low. Magnesium is a mineral that is found in many foods. It is used in many different processes in the body. Hypomagnesemia can affect every organ in the body. In severe cases, it can cause life-threatening problems. What are the causes? This condition may be caused by:  Not getting enough magnesium in your diet.  Malnutrition.  Problems with absorbing magnesium from the intestines.  Dehydration.  Alcohol abuse.  Vomiting.  Severe or chronic diarrhea.  Some medicines, including medicines that make you urinate more (diuretics).  Certain diseases, such as kidney disease, diabetes, celiac disease, and overactive thyroid. What are the signs or symptoms? Symptoms of this condition include:  Loss of appetite.  Nausea and vomiting.  Involuntary shaking or  trembling of a body part (tremor).  Muscle weakness.  Tingling in the arms and legs.  Sudden tightening of muscles (muscle spasms).  Confusion.  Psychiatric issues, such as depression, irritability, or psychosis.  A feeling of fluttering of the heart.  Seizures. These symptoms are more severe if magnesium levels drop suddenly. How is this diagnosed? This condition may be diagnosed based on:  Your symptoms and medical history.  A physical exam.  Blood and urine tests. How is this treated? Treatment depends on the cause and the severity of the condition. It may be treated with:  A magnesium supplement. This can be taken in pill form. If the condition is severe, magnesium is usually given through an IV.  Changes to your diet. You may be directed to eat foods that have a lot of magnesium, such as green leafy vegetables, peas, beans, and nuts.  Stopping any intake of alcohol.   Follow these instructions at home:  Make sure that your diet includes foods with magnesium. Foods that have a lot of magnesium in them include: ? Green leafy vegetables, such as spinach and broccoli. ? Beans and peas. ? Nuts and seeds, such as almonds and sunflower seeds. ? Whole grains, such as whole grain bread and fortified cereals.  Take magnesium supplements if your health care provider tells you to do that. Take them as directed.  Take over-the-counter and prescription medicines only as told by your health care provider.  Have your magnesium levels monitored as told by your health care provider.  When you are active, drink fluids that contain electrolytes.  Avoid drinking alcohol.  Keep all follow-up visits as told by your health care provider. This is important.      Contact a health care provider if:  You get worse instead of better.  Your symptoms return. Get help right away if you:  Develop severe muscle weakness.  Have trouble breathing.  Feel that your heart is  racing. Summary  Hypomagnesemia is a condition in which the level of magnesium in the blood is low.  Hypomagnesemia can affect every organ in the body.  Treatment may include eating more foods that contain magnesium, taking magnesium supplements, and not drinking alcohol.  Have your magnesium levels monitored as told by your health care  provider. This information is not intended to replace advice given to you by your health care provider. Make sure you discuss any questions you have with your health care provider. Document Revised: 07/31/2019 Document Reviewed: 07/31/2019 Elsevier Patient Education  [place].

## 2020-05-20 ENCOUNTER — Other Ambulatory Visit: Payer: Self-pay

## 2020-05-20 ENCOUNTER — Inpatient Hospital Stay: Payer: Medicare Other

## 2020-05-20 ENCOUNTER — Other Ambulatory Visit: Payer: Self-pay | Admitting: *Deleted

## 2020-05-20 ENCOUNTER — Inpatient Hospital Stay: Payer: Medicare Other | Admitting: Nutrition

## 2020-05-20 ENCOUNTER — Inpatient Hospital Stay (HOSPITAL_BASED_OUTPATIENT_CLINIC_OR_DEPARTMENT_OTHER): Payer: Medicare Other | Admitting: Oncology

## 2020-05-20 DIAGNOSIS — Z5111 Encounter for antineoplastic chemotherapy: Secondary | ICD-10-CM | POA: Diagnosis not present

## 2020-05-20 DIAGNOSIS — C259 Malignant neoplasm of pancreas, unspecified: Secondary | ICD-10-CM

## 2020-05-20 DIAGNOSIS — Z95828 Presence of other vascular implants and grafts: Secondary | ICD-10-CM

## 2020-05-20 DIAGNOSIS — G893 Neoplasm related pain (acute) (chronic): Secondary | ICD-10-CM | POA: Diagnosis not present

## 2020-05-20 LAB — CBC WITH DIFFERENTIAL (CANCER CENTER ONLY)
Abs Immature Granulocytes: 0.02 10*3/uL (ref 0.00–0.07)
Basophils Absolute: 0 10*3/uL (ref 0.0–0.1)
Basophils Relative: 1 %
Eosinophils Absolute: 0.3 10*3/uL (ref 0.0–0.5)
Eosinophils Relative: 6 %
HCT: 31.5 % — ABNORMAL LOW (ref 36.0–46.0)
Hemoglobin: 10.8 g/dL — ABNORMAL LOW (ref 12.0–15.0)
Immature Granulocytes: 1 %
Lymphocytes Relative: 27 %
Lymphs Abs: 1.1 10*3/uL (ref 0.7–4.0)
MCH: 32.7 pg (ref 26.0–34.0)
MCHC: 34.3 g/dL (ref 30.0–36.0)
MCV: 95.5 fL (ref 80.0–100.0)
Monocytes Absolute: 0.9 10*3/uL (ref 0.1–1.0)
Monocytes Relative: 23 %
Neutro Abs: 1.7 10*3/uL (ref 1.7–7.7)
Neutrophils Relative %: 42 %
Platelet Count: 152 10*3/uL (ref 150–400)
RBC: 3.3 MIL/uL — ABNORMAL LOW (ref 3.87–5.11)
RDW: 14.4 % (ref 11.5–15.5)
WBC Count: 3.9 10*3/uL — ABNORMAL LOW (ref 4.0–10.5)
nRBC: 0 % (ref 0.0–0.2)

## 2020-05-20 LAB — CMP (CANCER CENTER ONLY)
ALT: 10 U/L (ref 0–44)
AST: 13 U/L — ABNORMAL LOW (ref 15–41)
Albumin: 2.7 g/dL — ABNORMAL LOW (ref 3.5–5.0)
Alkaline Phosphatase: 87 U/L (ref 38–126)
Anion gap: 9 (ref 5–15)
BUN: 12 mg/dL (ref 8–23)
CO2: 25 mmol/L (ref 22–32)
Calcium: 8.5 mg/dL — ABNORMAL LOW (ref 8.9–10.3)
Chloride: 103 mmol/L (ref 98–111)
Creatinine: 0.56 mg/dL (ref 0.44–1.00)
GFR, Estimated: >60 mL/min (ref >60)
Glucose, Bld: 100 mg/dL — ABNORMAL HIGH (ref 70–99)
Potassium: 3.7 mmol/L (ref 3.5–5.1)
Sodium: 137 mmol/L (ref 135–145)
Total Bilirubin: 0.3 mg/dL (ref 0.3–1.2)
Total Protein: 5.2 g/dL — ABNORMAL LOW (ref 6.5–8.1)

## 2020-05-20 MED ORDER — PANTOPRAZOLE SODIUM 40 MG PO TBEC: 40.0000 mg | DELAYED_RELEASE_TABLET | Freq: Every day | ORAL | 1 refills | Status: AC

## 2020-05-20 MED ORDER — SODIUM CHLORIDE 0.9 % IV SOLN
10.0000 mg | Freq: Once | INTRAVENOUS | Status: AC
  Administered 2020-05-20: 10 mg via INTRAVENOUS
  Filled 2020-05-20: qty 10

## 2020-05-20 MED ORDER — PALONOSETRON HCL INJECTION 0.25 MG/5ML
0.2500 mg | Freq: Once | INTRAVENOUS | Status: AC
  Administered 2020-05-20: 0.25 mg via INTRAVENOUS

## 2020-05-20 MED ORDER — SODIUM CHLORIDE 0.9% FLUSH
10.0000 mL | Freq: Once | INTRAVENOUS | Status: AC
  Administered 2020-05-20: 10 mL
  Filled 2020-05-20: qty 10

## 2020-05-20 MED ORDER — SODIUM CHLORIDE 0.9 % IV SOLN
300.0000 mg/m2 | Freq: Once | INTRAVENOUS | Status: AC
  Administered 2020-05-20: 478 mg via INTRAVENOUS
  Filled 2020-05-20: qty 23.9

## 2020-05-20 MED ORDER — ATROPINE SULFATE 1 MG/ML IJ SOLN
INTRAMUSCULAR | Status: AC
  Filled 2020-05-20: qty 1

## 2020-05-20 MED ORDER — HEPARIN SOD (PORK) LOCK FLUSH 100 UNIT/ML IV SOLN
500.0000 [IU] | Freq: Once | INTRAVENOUS | Status: DC | PRN
  Filled 2020-05-20: qty 5

## 2020-05-20 MED ORDER — OXYCODONE HCL 10 MG PO TABS: 10.0000 mg | ORAL_TABLET | ORAL | 0 refills | Status: AC | PRN

## 2020-05-20 MED ORDER — SODIUM CHLORIDE 0.9 % IV SOLN
110.0000 mg/m2 | Freq: Once | INTRAVENOUS | Status: AC
  Administered 2020-05-20: 180 mg via INTRAVENOUS
  Filled 2020-05-20: qty 9

## 2020-05-20 MED ORDER — SODIUM CHLORIDE 0.9 % IV SOLN
150.0000 mg | Freq: Once | INTRAVENOUS | Status: AC
  Administered 2020-05-20: 150 mg via INTRAVENOUS
  Filled 2020-05-20: qty 150

## 2020-05-20 MED ORDER — PALONOSETRON HCL INJECTION 0.25 MG/5ML
INTRAVENOUS | Status: AC
  Filled 2020-05-20: qty 5

## 2020-05-20 MED ORDER — ATROPINE SULFATE 1 MG/ML IJ SOLN
0.5000 mg | Freq: Once | INTRAMUSCULAR | Status: AC | PRN
  Administered 2020-05-20: 0.5 mg via INTRAVENOUS

## 2020-05-20 MED ORDER — SODIUM CHLORIDE 0.9 % IV SOLN
1600.0000 mg/m2 | INTRAVENOUS | Status: DC
  Administered 2020-05-20: 2550 mg via INTRAVENOUS
  Filled 2020-05-20: qty 51

## 2020-05-20 MED ORDER — SODIUM CHLORIDE 0.9% FLUSH
10.0000 mL | INTRAVENOUS | Status: DC | PRN
Start: 2020-05-20 — End: 2020-05-20
  Administered 2020-05-20: 10 mL
  Filled 2020-05-20: qty 10

## 2020-05-20 MED ORDER — OXALIPLATIN CHEMO INJECTION 100 MG/20ML
85.0000 mg/m2 | Freq: Once | INTRAVENOUS | Status: AC
  Administered 2020-05-20: 135 mg via INTRAVENOUS
  Filled 2020-05-20: qty 20

## 2020-05-20 MED ORDER — DEXTROSE 5 % IV SOLN
Freq: Once | INTRAVENOUS | Status: AC
  Filled 2020-05-20: qty 250

## 2020-05-20 MED ORDER — MORPHINE SULFATE ER 100 MG PO TBCR: 100.0000 mg | EXTENDED_RELEASE_TABLET | Freq: Two times a day (BID) | ORAL | 0 refills | Status: AC

## 2020-05-20 NOTE — Patient Instructions (Addendum)
[facility] Discharge Instructions for Patients Receiving Chemotherapy  Today you received the following chemotherapy agents: Oxaliplatin,  Irinotecan and Irinotecan  To help prevent nausea and vomiting after your treatment, we encourage you to take your nausea medication as directed by your MD.   If you develop nausea and vomiting that is not controlled by your nausea medication, call the clinic.   BELOW ARE SYMPTOMS THAT SHOULD BE REPORTED IMMEDIATELY:  *FEVER GREATER THAN 100.5 F  *CHILLS WITH OR WITHOUT FEVER  NAUSEA AND VOMITING THAT IS NOT CONTROLLED WITH YOUR NAUSEA MEDICATION  *UNUSUAL SHORTNESS OF BREATH  *UNUSUAL BRUISING OR BLEEDING  TENDERNESS IN MOUTH AND THROAT WITH OR WITHOUT PRESENCE OF ULCERS  *URINARY PROBLEMS  *BOWEL PROBLEMS  UNUSUAL RASH Items with * indicate a potential emergency and should be followed up as soon as possible.  Feel free to call the clinic should you have any questions or concerns. The clinic phone number is [phone].  Please show the [facility] at check-in to the Emergency Department and triage nurse.

## 2020-05-20 NOTE — Progress Notes (Signed)
Normal saline orders placed for 1 liter on day of pump d/c. MD put scheduling order in LOS today.

## 2020-05-20 NOTE — Progress Notes (Signed)
Pt pump will be disconnected on Saturday 3/12, earlier than ordered run time. Per Betsy Coder, MD ok to waste remaining chemo.

## 2020-05-20 NOTE — Progress Notes (Signed)
Nutrition follow-up completed with patient during infusion for pancreatic cancer. Patient reports she has not had nausea or vomiting or diarrhea today.  She feels she is improving.  She has started to add in some small amounts of food and is noting what is tolerated and what is not tolerated. So far can eat banana, [place] toast, instant oatmeal, and Pedialyte.  Reports abdominal pain continues but MD has increased pain medication.  Patient noted to have pitting edema.  Weight increased to 121.3 pounds on March 10.  Labs were reviewed.  Nutrition diagnosis: Inadequate oral intake slowly improving.  Intervention: Educated on strategies for increasing small amounts of food frequently throughout the day. Reviewed other food choices which may be better tolerated. Provided nutrition fact sheets. Questions were answered.  Teach back method used.  Contact information given.  Monitoring, evaluation, goals: Patient will tolerate adequate calories and protein to minimize loss of lean body mass.  Next visit: To be scheduled as needed.  **Disclaimer: This note was dictated with voice recognition software. Similar sounding words can inadvertently be transcribed and this note may contain transcription errors which may not have been corrected upon publication of note.**

## 2020-05-20 NOTE — Progress Notes (Signed)
[place] OFFICE PROGRESS NOTE   Diagnosis: Pancreas cancer  INTERVAL HISTORY:   Ms. Horan completed another cycle of FOLFIRINOX on 05/06/2020.  She had increased diarrhea following this cycle of chemotherapy and was seen in the symptom management clinic on 05/15/2019.  She received intravenous fluids and felt better.  The diarrhea has resolved. She reports cold sensitivity and peripheral numbness lasting 3 to 4 days following chemotherapy.  This has improved.  She continues to have abdominal pain.  She is taking 15 mg of oxycodone every 4 hours for breakthrough pain.  She complains of increased leg swelling today.  The left leg is painful.  Objective:  Vital signs in last 24 hours:  Blood pressure 127/69, pulse 89, temperature 97.8 F (36.6 C), temperature source Tympanic, resp. rate 20, height 5\' 7"  (1.702 m), weight 121 lb 4.8 oz (55 kg), SpO2 99 %.    HEENT: No thrush or ulcers Resp: Distant breath sounds, no respiratory distress Cardio: Regular rate and rhythm GI: No hepatosplenomegaly, tender in the mid upper abdomen, no mass Vascular: Pitting edema at the left greater than right lower leg  Skin: Mild erythema at the palms  Portacath/PICC-without erythema  Lab Results:  Lab Results  Component Value Date   WBC 3.9 (L) 05/20/2020   HGB 10.8 (L) 05/20/2020   HCT 31.5 (L) 05/20/2020   MCV 95.5 05/20/2020   PLT 152 05/20/2020   NEUTROABS 1.7 05/20/2020    CMP  Lab Results  Component Value Date   NA 136 05/14/2020   K 3.6 05/14/2020   CL 104 05/14/2020   CO2 24 05/14/2020   GLUCOSE 89 05/14/2020   BUN 8 05/14/2020   CREATININE 0.58 05/14/2020   CALCIUM 7.9 (L) 05/14/2020   PROT 4.7 (L) 05/14/2020   ALBUMIN 2.5 (L) 05/14/2020   AST 11 (L) 05/14/2020   ALT 8 05/14/2020   ALKPHOS 67 05/14/2020   BILITOT 0.3 05/14/2020   GFRNONAA >60 05/14/2020   GFRAA 97 01/26/2020     Medications: I have reviewed the patient's current  medications.   Assessment/Plan: 1. Pancreas cancer, adenocarcinoma the pancreas body, status post a CT-guided biopsy 01/09/2020 ? CTs 12/29/2019-pancreas body mass adjacent to the SMA, 11 mm right upper lobe nodule and 6 mm left upper lobe nodule as well as other tiny lung nodules new compared to a CT from 11/21/2017.  Upper normal right hilar and subcarinal nodes ? 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 EUS 01/02/2020-irregular pancreas body mass with poorly defined borders, lack of portal vein invasion, FNA not possible, no evidence of invasion into the portal vein ? PET 01/21/2020-hypermetabolic pancreas body/neck mass, hypermetabolic 1 cm right upper lobe nodule, other small pulmonary nodules below PET resolution ? Cycle 1 gemcitabine/Abraxane 02/06/2020 ? Cycle 2 gemcitabine/Abraxane 02/20/2020, Aloxi/Decadron added to the antiemetic regimen ? Cycle 3 gemcitabine/Abraxane 03/03/2020 ? Cycle 4 gemcitabine/abraxane 03/18/2020  ? CT abdomen/pelvis 03/31/2020- increase in size of the pancreas body/tail mass with progressive encasement of the portal venous confluence, posterior extension to involve the SMA without complete encasement, no evidence of metastatic disease ? Cycle 5 gemcitabine/Abraxane 04/01/2020 ? Cycle 1 FOLFIRINOX 04/21/2020 ? Cycle 2 FOLFIRINOX 05/06/2020 ? Cycle 3 FOLFIRINOX 05/20/2020 2.   Abdomen/back pain secondary  to #1 3.   Anorexia/weight loss secondary to #1 4.   Atrial flutter 5.   COPD 6.   Port-A-Cath placement 01/20/2020 7.  Constipation, secondary to #1, managed with Senokot and MiraLAX 8. Stye of left eye 03/18/2020 9.  Increased left leg edema with erythema-negative Doppler 04/09/2020, course of doxycycline prescribed  04/15/2020 10.  Diarrhea following cycle 2 FOLFIRINOX    Disposition:  Ms.Carcione has completed 2 cycles of FOLFIRINOX.  Cycle 2 FOLFIRINOX was complicated by increased diarrhea.  The diarrhea has resolved.  Chemotherapy will be dose adjusted with cycle 3.  She would like to receive IV fluids on day 3.  She understands the IV fluids may worsen the lower extremity edema.  She will elevate her legs and use support stockings.  She continues to have pain secondary to the pancreas tumor.  We decided to increase MS Contin to 100 mg every 12 hours.  She will take oxycodone (10-20 mg) as needed for breakthrough pain.  She will return for an office visit in the next cycle of chemotherapy in 2 weeks.  The plan is to schedule a restaging CT evaluation after cycle 5 FOLFIRINOX.  Betsy Coder, MD  05/20/2020  11:44 AM

## 2020-05-21 ENCOUNTER — Telehealth: Payer: Self-pay | Admitting: Oncology

## 2020-05-21 NOTE — Telephone Encounter (Signed)
Scheduled appointments per 3/10 los. Called patient, no answer. Left message with appointments date and times.

## 2020-05-22 ENCOUNTER — Other Ambulatory Visit: Payer: Self-pay

## 2020-05-22 ENCOUNTER — Inpatient Hospital Stay: Payer: Medicare Other

## 2020-05-22 VITALS — BP 108/69 | HR 70 | Temp 98.1°F | Resp 20

## 2020-05-22 DIAGNOSIS — C259 Malignant neoplasm of pancreas, unspecified: Secondary | ICD-10-CM

## 2020-05-22 DIAGNOSIS — Z5111 Encounter for antineoplastic chemotherapy: Secondary | ICD-10-CM | POA: Diagnosis not present

## 2020-05-22 MED ORDER — SODIUM CHLORIDE 0.9 % IV SOLN
INTRAVENOUS | Status: DC
  Filled 2020-05-22 (×2): qty 250

## 2020-05-22 MED ORDER — SODIUM CHLORIDE 0.9% FLUSH
10.0000 mL | INTRAVENOUS | Status: DC | PRN
Start: 2020-05-22 — End: 2020-05-22
  Administered 2020-05-22: 10 mL
  Filled 2020-05-22: qty 10

## 2020-05-22 MED ORDER — HEPARIN SOD (PORK) LOCK FLUSH 100 UNIT/ML IV SOLN
500.0000 [IU] | Freq: Once | INTRAVENOUS | Status: AC | PRN
  Administered 2020-05-22: 500 [IU]
  Filled 2020-05-22: qty 5

## 2020-05-22 NOTE — Patient Instructions (Signed)
Rehydration, Adult Rehydration is the replacement of body fluids, salts, and minerals (electrolytes) that are lost during dehydration. Dehydration is when there is not enough water or other fluids in the body. This happens when you lose more fluids than you take in. Common causes of dehydration include:  Not drinking enough fluids. This can occur when you are ill or doing activities that require a lot of energy, especially in hot weather.  Conditions that cause loss of water or other fluids, such as diarrhea, vomiting, sweating, or urinating a lot.  Other illnesses, such as fever or infection.  Certain medicines, such as those that remove excess fluid from the body (diuretics). Symptoms of mild or moderate dehydration may include thirst, dry lips and mouth, and dizziness. Symptoms of severe dehydration may include increased heart rate, confusion, fainting, and not urinating. For severe dehydration, you may need to get fluids through an IV at the hospital. For mild or moderate dehydration, you can usually rehydrate at home by drinking certain fluids as told by your health care provider. What are the risks? Generally, rehydration is safe. However, taking in too much fluid (overhydration) can be a problem. This is rare. Overhydration can cause an electrolyte imbalance, kidney failure, or a decrease in salt (sodium) levels in the body. Supplies needed You will need an oral rehydration solution (ORS) if your health care provider tells you to use one. This is a drink to treat dehydration. It can be found in pharmacies and retail stores. How to rehydrate Fluids Follow instructions from your health care provider for rehydration. The kind of fluid and the amount you should drink depend on your condition. In general, you should choose drinks that you prefer.  If told by your health care provider, drink an ORS. ? Make an ORS by following instructions on the package. ? Start by drinking small amounts,  about  cup (120 mL) every 5-10 minutes. ? Slowly increase how much you drink until you have taken the amount recommended by your health care provider.  Drink enough clear fluids to keep your urine pale yellow. If you were told to drink an ORS, finish it first, then start slowly drinking other clear fluids. Drink fluids such as: ? Water. This includes sparkling water and flavored water. Drinking only water can lead to having too little sodium in your body (hyponatremia). Follow the advice of your health care provider. ? Water from ice chips you suck on. ? Fruit juice with water you add to it (diluted). ? Sports drinks. ? Hot or cold herbal teas. ? Broth-based soups. ? Milk or milk products. Food Follow instructions from your health care provider about what to eat while you rehydrate. Your health care provider may recommend that you slowly begin eating regular foods in small amounts.  Eat foods that contain a healthy balance of electrolytes, such as bananas, oranges, potatoes, tomatoes, and spinach.  Avoid foods that are greasy or contain a lot of sugar. In some cases, you may get nutrition through a feeding tube that is passed through your nose and into your stomach (nasogastric tube, or NG tube). This may be done if you have uncontrolled vomiting or diarrhea.   Beverages to avoid Certain beverages may make dehydration worse. While you rehydrate, avoid drinking alcohol.   How to tell if you are recovering from dehydration You may be recovering from dehydration if:  You are urinating more often than before you started rehydrating.  Your urine is pale yellow.  Your energy level   improves.  You vomit less frequently.  You have diarrhea less frequently.  Your appetite improves or returns to normal.  You feel less dizzy or less light-headed.  Your skin tone and color start to look more normal. Follow these instructions at home:  Take over-the-counter and prescription medicines only  as told by your health care provider.  Do not take sodium tablets. Doing this can lead to having too much sodium in your body (hypernatremia). Contact a health care provider if:  You continue to have symptoms of mild or moderate dehydration, such as: ? Thirst. ? Dry lips. ? Slightly dry mouth. ? Dizziness. ? Dark urine or less urine than normal. ? Muscle cramps.  You continue to vomit or have diarrhea. Get help right away if you:  Have symptoms of dehydration that get worse.  Have a fever.  Have a severe headache.  Have been vomiting and the following happens: ? Your vomiting gets worse or does not go away. ? Your vomit includes blood or green matter (bile). ? You cannot eat or drink without vomiting.  Have problems with urination or bowel movements, such as: ? Diarrhea that gets worse or does not go away. ? Blood in your stool (feces). This may cause stool to look black and tarry. ? Not urinating, or urinating only a small amount of very dark urine, within 6-8 hours.  Have trouble breathing.  Have symptoms that get worse with treatment. These symptoms may represent a serious problem that is an emergency. Do not wait to see if the symptoms will go away. Get medical help right away. Call your local emergency services (911 in the [place] Do not drive yourself to the hospital. Summary  Rehydration is the replacement of body fluids and minerals (electrolytes) that are lost during dehydration.  Follow instructions from your health care provider for rehydration. The kind of fluid and amount you should drink depend on your condition.  Slowly increase how much you drink until you have taken the amount recommended by your health care provider.  Contact your health care provider if you continue to show signs of mild or moderate dehydration. This information is not intended to replace advice given to you by your health care provider. Make sure you discuss any questions you have with  your health care provider. Document Revised: 04/30/2019 Document Reviewed: 03/10/2019 [employer] Patient Education  2021 [employer]  

## 2020-05-25 ENCOUNTER — Telehealth: Payer: Self-pay

## 2020-05-25 NOTE — Telephone Encounter (Signed)
TC to Pt. To confirm appointment in symptom management tomorrow 05/26/20 at 2 pm Pt verbalized understanding. No further problems or concerns noted.

## 2020-05-25 NOTE — Telephone Encounter (Signed)
Patient's husband Rebecca Weaver calls requesting she get IVF sometime this week.  He states she is still not taking in a lot of fluids.  I have notified Lenox Ponds LPN to arrange fluids and made Dr. Benay Spice aware.

## 2020-05-26 ENCOUNTER — Other Ambulatory Visit: Payer: Self-pay

## 2020-05-26 ENCOUNTER — Emergency Department (HOSPITAL_COMMUNITY): Payer: Medicare Other

## 2020-05-26 ENCOUNTER — Inpatient Hospital Stay: Payer: Medicare Other

## 2020-05-26 ENCOUNTER — Inpatient Hospital Stay (HOSPITAL_BASED_OUTPATIENT_CLINIC_OR_DEPARTMENT_OTHER): Payer: Medicare Other | Admitting: Medical

## 2020-05-26 ENCOUNTER — Emergency Department (HOSPITAL_BASED_OUTPATIENT_CLINIC_OR_DEPARTMENT_OTHER): Payer: Medicare Other

## 2020-05-26 ENCOUNTER — Encounter (HOSPITAL_COMMUNITY): Payer: Self-pay

## 2020-05-26 ENCOUNTER — Emergency Department (HOSPITAL_COMMUNITY)
Admission: EM | Admit: 2020-05-26 | Discharge: 2020-05-26 | Disposition: A | Discharge status: Home / Self Care | Payer: Medicare Other | Attending: Emergency Medicine | Admitting: Emergency Medicine

## 2020-05-26 VITALS — BP 135/82 | HR 89 | Temp 97.8°F | Resp 15 | Ht 67.0 in | Wt 121.8 lb

## 2020-05-26 DIAGNOSIS — Z20822 Contact with and (suspected) exposure to covid-19: Secondary | ICD-10-CM | POA: Insufficient documentation

## 2020-05-26 DIAGNOSIS — J449 Chronic obstructive pulmonary disease, unspecified: Secondary | ICD-10-CM | POA: Diagnosis not present

## 2020-05-26 DIAGNOSIS — R600 Localized edema: Secondary | ICD-10-CM | POA: Insufficient documentation

## 2020-05-26 DIAGNOSIS — R1084 Generalized abdominal pain: Secondary | ICD-10-CM | POA: Insufficient documentation

## 2020-05-26 DIAGNOSIS — R609 Edema, unspecified: Secondary | ICD-10-CM | POA: Diagnosis not present

## 2020-05-26 DIAGNOSIS — C259 Malignant neoplasm of pancreas, unspecified: Secondary | ICD-10-CM

## 2020-05-26 DIAGNOSIS — I10 Essential (primary) hypertension: Secondary | ICD-10-CM | POA: Diagnosis not present

## 2020-05-26 DIAGNOSIS — F17210 Nicotine dependence, cigarettes, uncomplicated: Secondary | ICD-10-CM | POA: Diagnosis not present

## 2020-05-26 DIAGNOSIS — E860 Dehydration: Secondary | ICD-10-CM | POA: Diagnosis not present

## 2020-05-26 DIAGNOSIS — G893 Neoplasm related pain (acute) (chronic): Secondary | ICD-10-CM | POA: Diagnosis not present

## 2020-05-26 DIAGNOSIS — Z7951 Long term (current) use of inhaled steroids: Secondary | ICD-10-CM | POA: Insufficient documentation

## 2020-05-26 DIAGNOSIS — R5383 Other fatigue: Secondary | ICD-10-CM | POA: Insufficient documentation

## 2020-05-26 DIAGNOSIS — Z79899 Other long term (current) drug therapy: Secondary | ICD-10-CM | POA: Diagnosis not present

## 2020-05-26 DIAGNOSIS — K219 Gastro-esophageal reflux disease without esophagitis: Secondary | ICD-10-CM | POA: Diagnosis not present

## 2020-05-26 DIAGNOSIS — Z7901 Long term (current) use of anticoagulants: Secondary | ICD-10-CM | POA: Insufficient documentation

## 2020-05-26 DIAGNOSIS — R197 Diarrhea, unspecified: Secondary | ICD-10-CM | POA: Diagnosis not present

## 2020-05-26 LAB — COMPREHENSIVE METABOLIC PANEL
ALT: 13 U/L (ref 0–44)
AST: 16 U/L (ref 15–41)
Albumin: 2.4 g/dL — ABNORMAL LOW (ref 3.5–5.0)
Alkaline Phosphatase: 67 U/L (ref 38–126)
Anion gap: 13 (ref 5–15)
BUN: 14 mg/dL (ref 8–23)
CO2: 21 mmol/L — ABNORMAL LOW (ref 22–32)
Calcium: 7.9 mg/dL — ABNORMAL LOW (ref 8.9–10.3)
Chloride: 103 mmol/L (ref 98–111)
Creatinine, Ser: 0.65 mg/dL (ref 0.44–1.00)
GFR, Estimated: >60 mL/min (ref >60)
Glucose, Bld: 87 mg/dL (ref 70–99)
Potassium: 3.8 mmol/L (ref 3.5–5.1)
Sodium: 137 mmol/L (ref 135–145)
Total Bilirubin: 1.1 mg/dL (ref 0.3–1.2)
Total Protein: 4.5 g/dL — ABNORMAL LOW (ref 6.5–8.1)

## 2020-05-26 LAB — CBC WITH DIFFERENTIAL (CANCER CENTER ONLY)
Abs Immature Granulocytes: 0.04 10*3/uL (ref 0.00–0.07)
Basophils Absolute: 0 10*3/uL (ref 0.0–0.1)
Basophils Relative: 1 %
Eosinophils Absolute: 0.2 10*3/uL (ref 0.0–0.5)
Eosinophils Relative: 3 %
HCT: 32.8 % — ABNORMAL LOW (ref 36.0–46.0)
Hemoglobin: 10.8 g/dL — ABNORMAL LOW (ref 12.0–15.0)
Immature Granulocytes: 1 %
Lymphocytes Relative: 16 %
Lymphs Abs: 0.9 10*3/uL (ref 0.7–4.0)
MCH: 31.9 pg (ref 26.0–34.0)
MCHC: 32.9 g/dL (ref 30.0–36.0)
MCV: 96.8 fL (ref 80.0–100.0)
Monocytes Absolute: 0.2 10*3/uL (ref 0.1–1.0)
Monocytes Relative: 3 %
Neutro Abs: 4.1 10*3/uL (ref 1.7–7.7)
Neutrophils Relative %: 76 %
Platelet Count: 82 10*3/uL — ABNORMAL LOW (ref 150–400)
RBC: 3.39 MIL/uL — ABNORMAL LOW (ref 3.87–5.11)
RDW: 14.4 % (ref 11.5–15.5)
WBC Count: 5.4 10*3/uL (ref 4.0–10.5)
nRBC: 0 % (ref 0.0–0.2)

## 2020-05-26 LAB — CBC WITH DIFFERENTIAL/PLATELET
Abs Immature Granulocytes: 0.03 10*3/uL (ref 0.00–0.07)
Basophils Absolute: 0 10*3/uL (ref 0.0–0.1)
Basophils Relative: 0 %
Eosinophils Absolute: 0.1 10*3/uL (ref 0.0–0.5)
Eosinophils Relative: 3 %
HCT: 31.9 % — ABNORMAL LOW (ref 36.0–46.0)
Hemoglobin: 10.4 g/dL — ABNORMAL LOW (ref 12.0–15.0)
Immature Granulocytes: 1 %
Lymphocytes Relative: 15 %
Lymphs Abs: 0.8 10*3/uL (ref 0.7–4.0)
MCH: 32.1 pg (ref 26.0–34.0)
MCHC: 32.6 g/dL (ref 30.0–36.0)
MCV: 98.5 fL (ref 80.0–100.0)
Monocytes Absolute: 0.2 10*3/uL (ref 0.1–1.0)
Monocytes Relative: 4 %
Neutro Abs: 3.9 10*3/uL (ref 1.7–7.7)
Neutrophils Relative %: 77 %
Platelets: 76 10*3/uL — ABNORMAL LOW (ref 150–400)
RBC: 3.24 MIL/uL — ABNORMAL LOW (ref 3.87–5.11)
RDW: 14.4 % (ref 11.5–15.5)
WBC: 5 10*3/uL (ref 4.0–10.5)
nRBC: 0 % (ref 0.0–0.2)

## 2020-05-26 LAB — CMP (CANCER CENTER ONLY)
ALT: 13 U/L (ref 0–44)
AST: 16 U/L (ref 15–41)
Albumin: 2.4 g/dL — ABNORMAL LOW (ref 3.5–5.0)
Alkaline Phosphatase: 80 U/L (ref 38–126)
Anion gap: 11 (ref 5–15)
BUN: 13 mg/dL (ref 8–23)
CO2: 21 mmol/L — ABNORMAL LOW (ref 22–32)
Calcium: 8.1 mg/dL — ABNORMAL LOW (ref 8.9–10.3)
Chloride: 102 mmol/L (ref 98–111)
Creatinine: 0.6 mg/dL (ref 0.44–1.00)
GFR, Estimated: >60 mL/min (ref >60)
Glucose, Bld: 88 mg/dL (ref 70–99)
Potassium: 3.8 mmol/L (ref 3.5–5.1)
Sodium: 134 mmol/L — ABNORMAL LOW (ref 135–145)
Total Bilirubin: 0.4 mg/dL (ref 0.3–1.2)
Total Protein: 4.8 g/dL — ABNORMAL LOW (ref 6.5–8.1)

## 2020-05-26 LAB — RESP PANEL BY RT-PCR (FLU A&B, COVID) ARPGX2
Influenza A by PCR: NEGATIVE
Influenza B by PCR: NEGATIVE
SARS Coronavirus 2 by RT PCR: NEGATIVE

## 2020-05-26 LAB — BRAIN NATRIURETIC PEPTIDE: B Natriuretic Peptide: 122.1 pg/mL — ABNORMAL HIGH (ref 0.0–100.0)

## 2020-05-26 LAB — TROPONIN I (HIGH SENSITIVITY): Troponin I (High Sensitivity): 10 ng/L (ref <18)

## 2020-05-26 LAB — LACTIC ACID, PLASMA: Lactic Acid, Venous: 1 mmol/L (ref 0.5–1.9)

## 2020-05-26 LAB — MAGNESIUM: Magnesium: 1.5 mg/dL — ABNORMAL LOW (ref 1.7–2.4)

## 2020-05-26 LAB — LIPASE, BLOOD: Lipase: 27 U/L (ref 11–51)

## 2020-05-26 MED ORDER — HYDROMORPHONE HCL 1 MG/ML IJ SOLN
1.0000 mg | Freq: Once | INTRAMUSCULAR | Status: AC
Start: 2020-05-26 — End: 2020-05-26
  Administered 2020-05-26: 1 mg via INTRAVENOUS
  Filled 2020-05-26: qty 1

## 2020-05-26 MED ORDER — MORPHINE SULFATE (PF) 2 MG/ML IV SOLN
2.0000 mg | Freq: Once | INTRAVENOUS | Status: AC
  Administered 2020-05-26: 2 mg via INTRAVENOUS

## 2020-05-26 MED ORDER — IOHEXOL 300 MG/ML  SOLN
100.0000 mL | Freq: Once | INTRAMUSCULAR | Status: AC | PRN
  Administered 2020-05-26: 80 mL via INTRAVENOUS

## 2020-05-26 MED ORDER — MORPHINE SULFATE (PF) 2 MG/ML IV SOLN: 2.0000 mg | Freq: Once | INTRAVENOUS | Status: DC

## 2020-05-26 MED ORDER — SODIUM CHLORIDE 0.9 % IV SOLN
INTRAVENOUS | Status: DC
  Filled 2020-05-26 (×2): qty 250

## 2020-05-26 MED ORDER — HYDROMORPHONE HCL 1 MG/ML IJ SOLN
1.0000 mg | Freq: Once | INTRAMUSCULAR | Status: AC
  Administered 2020-05-26: 1 mg via INTRAVENOUS
  Filled 2020-05-26: qty 1

## 2020-05-26 MED ORDER — MORPHINE SULFATE (PF) 2 MG/ML IV SOLN
INTRAVENOUS | Status: AC
  Filled 2020-05-26: qty 1

## 2020-05-26 MED ORDER — MORPHINE SULFATE (PF) 2 MG/ML IV SOLN
2.0000 mg | Freq: Once | INTRAVENOUS | Status: AC
Start: 2020-05-26 — End: 2020-05-26
  Administered 2020-05-26: 2 mg via INTRAVENOUS

## 2020-05-26 MED ORDER — HEPARIN SOD (PORK) LOCK FLUSH 100 UNIT/ML IV SOLN
500.0000 [IU] | Freq: Once | INTRAVENOUS | Status: AC
  Administered 2020-05-26: 500 [IU]
  Filled 2020-05-26: qty 5

## 2020-05-26 MED ORDER — SODIUM CHLORIDE 0.9 % IV BOLUS
500.0000 mL | Freq: Once | INTRAVENOUS | Status: AC
  Administered 2020-05-26: 500 mL via INTRAVENOUS

## 2020-05-26 NOTE — ED Notes (Addendum)
Patient back from CT, Ultrasound now at bedside.

## 2020-05-26 NOTE — Patient Instructions (Signed)
Rehydration, Adult Rehydration is the replacement of body fluids, salts, and minerals (electrolytes) that are lost during dehydration. Dehydration is when there is not enough water or other fluids in the body. This happens when you lose more fluids than you take in. Common causes of dehydration include:  Not drinking enough fluids. This can occur when you are ill or doing activities that require a lot of energy, especially in hot weather.  Conditions that cause loss of water or other fluids, such as diarrhea, vomiting, sweating, or urinating a lot.  Other illnesses, such as fever or infection.  Certain medicines, such as those that remove excess fluid from the body (diuretics). Symptoms of mild or moderate dehydration may include thirst, dry lips and mouth, and dizziness. Symptoms of severe dehydration may include increased heart rate, confusion, fainting, and not urinating. For severe dehydration, you may need to get fluids through an IV at the hospital. For mild or moderate dehydration, you can usually rehydrate at home by drinking certain fluids as told by your health care provider. What are the risks? Generally, rehydration is safe. However, taking in too much fluid (overhydration) can be a problem. This is rare. Overhydration can cause an electrolyte imbalance, kidney failure, or a decrease in salt (sodium) levels in the body. Supplies needed You will need an oral rehydration solution (ORS) if your health care provider tells you to use one. This is a drink to treat dehydration. It can be found in pharmacies and retail stores. How to rehydrate Fluids Follow instructions from your health care provider for rehydration. The kind of fluid and the amount you should drink depend on your condition. In general, you should choose drinks that you prefer.  If told by your health care provider, drink an ORS. ? Make an ORS by following instructions on the package. ? Start by drinking small amounts,  about  cup (120 mL) every 5-10 minutes. ? Slowly increase how much you drink until you have taken the amount recommended by your health care provider.  Drink enough clear fluids to keep your urine pale yellow. If you were told to drink an ORS, finish it first, then start slowly drinking other clear fluids. Drink fluids such as: ? Water. This includes sparkling water and flavored water. Drinking only water can lead to having too little sodium in your body (hyponatremia). Follow the advice of your health care provider. ? Water from ice chips you suck on. ? Fruit juice with water you add to it (diluted). ? Sports drinks. ? Hot or cold herbal teas. ? Broth-based soups. ? Milk or milk products. Food Follow instructions from your health care provider about what to eat while you rehydrate. Your health care provider may recommend that you slowly begin eating regular foods in small amounts.  Eat foods that contain a healthy balance of electrolytes, such as bananas, oranges, potatoes, tomatoes, and spinach.  Avoid foods that are greasy or contain a lot of sugar. In some cases, you may get nutrition through a feeding tube that is passed through your nose and into your stomach (nasogastric tube, or NG tube). This may be done if you have uncontrolled vomiting or diarrhea.   Beverages to avoid Certain beverages may make dehydration worse. While you rehydrate, avoid drinking alcohol.   How to tell if you are recovering from dehydration You may be recovering from dehydration if:  You are urinating more often than before you started rehydrating.  Your urine is pale yellow.  Your energy level   improves.  You vomit less frequently.  You have diarrhea less frequently.  Your appetite improves or returns to normal.  You feel less dizzy or less light-headed.  Your skin tone and color start to look more normal. Follow these instructions at home:  Take over-the-counter and prescription medicines only  as told by your health care provider.  Do not take sodium tablets. Doing this can lead to having too much sodium in your body (hypernatremia). Contact a health care provider if:  You continue to have symptoms of mild or moderate dehydration, such as: ? Thirst. ? Dry lips. ? Slightly dry mouth. ? Dizziness. ? Dark urine or less urine than normal. ? Muscle cramps.  You continue to vomit or have diarrhea. Get help right away if you:  Have symptoms of dehydration that get worse.  Have a fever.  Have a severe headache.  Have been vomiting and the following happens: ? Your vomiting gets worse or does not go away. ? Your vomit includes blood or green matter (bile). ? You cannot eat or drink without vomiting.  Have problems with urination or bowel movements, such as: ? Diarrhea that gets worse or does not go away. ? Blood in your stool (feces). This may cause stool to look black and tarry. ? Not urinating, or urinating only a small amount of very dark urine, within 6-8 hours.  Have trouble breathing.  Have symptoms that get worse with treatment. These symptoms may represent a serious problem that is an emergency. Do not wait to see if the symptoms will go away. Get medical help right away. Call your local emergency services (911 in the [place] Do not drive yourself to the hospital. Summary  Rehydration is the replacement of body fluids and minerals (electrolytes) that are lost during dehydration.  Follow instructions from your health care provider for rehydration. The kind of fluid and amount you should drink depend on your condition.  Slowly increase how much you drink until you have taken the amount recommended by your health care provider.  Contact your health care provider if you continue to show signs of mild or moderate dehydration. This information is not intended to replace advice given to you by your health care provider. Make sure you discuss any questions you have with  your health care provider. Document Revised: 04/30/2019 Document Reviewed: 03/10/2019 [employer] Patient Education  2021 [employer]  

## 2020-05-26 NOTE — CV Procedure (Signed)
BLE venous duplex completed. Dr. Sherry Ruffing and Solmon Ice, RN given preliminary results.  Results can be found under chart review under CV PROC. 05/26/2020 7:42 PM Doshia Dalia RVT, RDMS

## 2020-05-26 NOTE — ED Triage Notes (Signed)
Brought over from the [facility] for severe abdominal pain. Staff unable to control pain. Has had a decreased appetite since Sunday with c/o diarrhea. History of localized pancreatic cancer.

## 2020-05-26 NOTE — ED Provider Notes (Signed)
[facility] DEPT Provider Note   CSN: [id number] Arrival date & time: 05/26/20  1607     History Chief Complaint  Patient presents with  . Abdominal Pain    Rebecca Weaver is a 70 y.o. female.  The history is provided by the patient and medical records. No language interpreter was used.  Abdominal Pain Pain location:  Generalized Pain quality: aching and sharp   Pain radiates to:  Does not radiate Pain severity:  Severe Onset quality:  Gradual Duration:  1 day Timing:  Constant Progression:  Unchanged Chronicity:  New Context: not trauma   Relieved by:  Nothing Ineffective treatments:  None tried Associated symptoms: diarrhea and fatigue   Associated symptoms: no chest pain, no chills, no constipation, no cough, no dysuria, no fever, no nausea, no shortness of breath and no vomiting        Past Medical History:  Diagnosis Date  . Anemia   . Arrhythmia    Atrial fibrillation s/p TEE/DCCV 01/29/2020  . Arthritis   . COPD (chronic obstructive pulmonary disease) ([place])   . Coronary artery calcification   . Family history of breast cancer   . Family history of colon cancer   . GERD (gastroesophageal reflux disease)   . Hyperlipidemia   . Hypertension   . Lung nodules   . Osteopenia   . Pancreatic cancer ([place])   . Pseudotumor cerebri     Patient Active Problem List   Diagnosis Date Noted  . Genetic testing 03/16/2020  . Family history of breast cancer   . Family history of colon cancer   . Port-A-Cath in place 02/03/2020  . Atrial flutter ([place])   . Pancreatic adenocarcinoma ([place]) 01/28/2020  . Atrial flutter with rapid ventricular response ([place]) 12/30/2019  . Nicotine dependence, cigarettes, uncomplicated 78/29/5621  . Pancreatic lesion 12/30/2019  . Epigastric pain 12/30/2019  . Pulmonary nodules/lesions, multiple 12/30/2019  . COPD (chronic obstructive pulmonary disease) ([place]) 12/30/2019  . Essential hypertension  12/30/2019  . Mixed hyperlipidemia 12/30/2019  . Atrial fibrillation with RVR ([place]) 12/30/2019    Past Surgical History:  Procedure Laterality Date  . CARDIOVERSION N/A 01/29/2020   Procedure: CARDIOVERSION;  Surgeon: Buford Dresser, MD;  Location: [facility] ENDOSCOPY;  Service: Cardiovascular;  Laterality: N/A;  . CHOLECYSTECTOMY    . ESOPHAGOGASTRODUODENOSCOPY (EGD) WITH PROPOFOL N/A 01/02/2020   Procedure: ESOPHAGOGASTRODUODENOSCOPY (EGD) WITH PROPOFOL;  Surgeon: [name], MD;  Location: [facility] ENDOSCOPY;  Service: Endoscopy;  Laterality: N/A;  . FINE NEEDLE ASPIRATION N/A 01/02/2020   Procedure: FINE NEEDLE ASPIRATION (FNA) LINEAR;  Surgeon: [name], MD;  Location: [facility] ENDOSCOPY;  Service: Endoscopy;  Laterality: N/A;  . IR IMAGING GUIDED PORT INSERTION  01/20/2020  . TEE WITHOUT CARDIOVERSION N/A 01/29/2020   Procedure: TRANSESOPHAGEAL ECHOCARDIOGRAM (TEE);  Surgeon: Buford Dresser, MD;  Location: [facility];  Service: Cardiovascular;  Laterality: N/A;  . TUBAL LIGATION    . UPPER ESOPHAGEAL ENDOSCOPIC ULTRASOUND (EUS) N/A 01/02/2020   Procedure: UPPER ESOPHAGEAL ENDOSCOPIC ULTRASOUND (EUS);  Surgeon: [name], MD;  Location: [facility] ENDOSCOPY;  Service: Endoscopy;  Laterality: N/A;  . VARICOSE VEIN SURGERY     multiple     OB History   No obstetric history on file.     Family History  Problem Relation Age of Onset  . Varicose Veins Father   . Heart disease Father   . Varicose Veins Sister     Social History   Tobacco Use  . Smoking status: Current Every Day  Smoker    Packs/day: 0.75    Years: 50.00    Pack years: 37.50    Types: Cigarettes  . Smokeless tobacco: Never Used  Substance Use Topics  . Alcohol use: Yes    Alcohol/week: 0.0 standard drinks    Comment: social occassions only  . Drug use: Not Currently    Home Medications Prior to Admission medications   Medication Sig Start Date End Date Taking? Authorizing Provider  acetaminophen  (TYLENOL) 500 MG tablet Take 1,000 mg by mouth every 6 (six) hours as needed for moderate pain or headache. Patient not taking: No sig reported    [provider]  apixaban (ELIQUIS) 5 MG TABS tablet Take 1 tablet (5 mg total) by mouth 2 (two) times daily. Do not start this until you have the pancreatic biopsy done by GI. 03/01/20 08/28/20  O'Neal, Cassie Freer, MD  benazepril (LOTENSIN) 20 MG tablet Take 20 mg by mouth daily. 03/28/20   [provider]  Cyanocobalamin (B-12 COMPLIANCE INJECTION IJ) Inject 1 Dose as directed every 30 (thirty) days.    [provider]  diltiazem (CARDIZEM CD) 120 MG 24 hr capsule Take 1 capsule (120 mg total) by mouth daily. 03/01/20 08/28/20  Geralynn Rile, MD  diphenoxylate-atropine (LOMOTIL) 2.5-0.025 MG tablet Take 1-2 tablets by mouth 4 (four) times daily as needed for diarrhea or loose stools (Maximum of 8/day). Patient not taking: No sig reported 05/13/20   Ladell Pier, MD  ezetimibe (ZETIA) 10 MG tablet Take 10 mg by mouth daily.    [provider]  fluticasone (FLONASE) 50 MCG/ACT nasal spray Place 1 spray into both nostrils 2 (two) times daily.     [provider]  Fluticasone-Salmeterol (ADVAIR) 250-50 MCG/DOSE AEPB Inhale 1 puff into the lungs 2 (two) times daily.    [provider]  lidocaine-prilocaine (EMLA) cream Apply 1 application topically as directed. Apply 1 hour prior to port access and cover with plastic wrap 01/28/20   Ladell Pier, MD  loratadine (CLARITIN) 10 MG tablet Take 10 mg by mouth daily.    [provider]  LORazepam (ATIVAN) 0.5 MG tablet Take 1 tablet (0.5 mg total) by mouth at bedtime as needed for sleep. 03/31/20   Ladell Pier, MD  morphine (MS CONTIN) 100 MG 12 hr tablet Take 1 tablet (100 mg total) by mouth every 12 (twelve) hours. 05/20/20   Ladell Pier, MD  Naphazoline-Pheniramine (VISINE-A OP) Place 1 drop into both eyes daily.    [provider]  ondansetron (ZOFRAN) 8 MG tablet Take 1 tablet (8 mg total) by mouth every 8 (eight) hours as needed for nausea or vomiting. Patient not taking: Reported on 05/20/2020 01/28/20   Ladell Pier, MD  oxyCODONE 10 MG TABS Take 1-2 tablets (10-20 mg total) by mouth every 4 (four) hours as needed for severe pain. 2 to 3 PO Q 4 hours prn pain 05/20/20   Ladell Pier, MD  pantoprazole (PROTONIX) 40 MG tablet Take 1 tablet (40 mg total) by mouth daily. 05/20/20   Ladell Pier, MD  prochlorperazine (COMPAZINE) 10 MG tablet Take 1 tablet (10 mg total) by mouth every 6 (six) hours as needed for nausea. 01/28/20   Ladell Pier, MD    Allergies    Atorvastatin, Ciprofloxacin, and Sulfa antibiotics  Review of Systems   Review of Systems  Constitutional: Positive for fatigue. Negative for chills, diaphoresis and fever.  HENT: Negative for congestion.  Eyes: Negative for visual disturbance.  Respiratory: Negative for cough, chest tightness, shortness of breath and wheezing.   Cardiovascular: Positive for leg swelling. Negative for chest pain and palpitations.  Gastrointestinal: Positive for abdominal pain and diarrhea. Negative for constipation, nausea and vomiting.  Genitourinary: Negative for dysuria, flank pain and frequency.  Musculoskeletal: Negative for back pain, neck pain and neck stiffness.  Skin: Negative for rash and wound.  Neurological: Negative for headaches.  Psychiatric/Behavioral: Negative for agitation.  All other systems reviewed and are negative.   Physical Exam Updated Vital Signs There were no vitals taken for this visit.  Physical Exam Vitals and nursing note reviewed.  Constitutional:      General: She is not in acute distress.    Appearance: She is well-developed.  HENT:     Head: Normocephalic and atraumatic.  Eyes:     Conjunctiva/sclera: Conjunctivae normal.  Cardiovascular:     Rate and Rhythm: Normal rate and regular rhythm.      Heart sounds: No murmur heard.   Pulmonary:     Effort: Pulmonary effort is normal. No respiratory distress.     Breath sounds: Normal breath sounds. No wheezing, rhonchi or rales.  Chest:     Chest wall: No tenderness.  Abdominal:     General: Abdomen is flat. Bowel sounds are normal. There is no distension.     Palpations: Abdomen is soft.     Tenderness: There is generalized abdominal tenderness. There is no right CVA tenderness or left CVA tenderness.  Musculoskeletal:     Cervical back: Neck supple.  Skin:    General: Skin is warm and dry.     Capillary Refill: Capillary refill takes less than 2 seconds.  Neurological:     General: No focal deficit present.     Mental Status: She is alert.  Psychiatric:        Mood and Affect: Affect is tearful.     ED Results / Procedures / Treatments   Labs (all labs ordered are listed, but only abnormal results are displayed) Labs Reviewed  CBC WITH DIFFERENTIAL/PLATELET - Abnormal; Notable for the following components:      Result Value   RBC 3.24 (*)    Hemoglobin 10.4 (*)    HCT 31.9 (*)    Platelets 76 (*)    All other components within normal limits  COMPREHENSIVE METABOLIC PANEL - Abnormal; Notable for the following components:   CO2 21 (*)    Calcium 7.9 (*)    Total Protein 4.5 (*)    Albumin 2.4 (*)    All other components within normal limits  BRAIN NATRIURETIC PEPTIDE - Abnormal; Notable for the following components:   B Natriuretic Peptide 122.1 (*)    All other components within normal limits  RESP PANEL BY RT-PCR (FLU A&B, COVID) [id number]  URINE CULTURE  LACTIC ACID, PLASMA  LIPASE, BLOOD  URINALYSIS, ROUTINE W REFLEX MICROSCOPIC  TROPONIN I (HIGH SENSITIVITY)    EKG EKG Interpretation  Date/Time:  Wednesday May 26 2020 17:10:42 EDT Ventricular Rate:  84 PR Interval:    QRS Duration: 98 QT Interval:  398 QTC Calculation: 471 R Axis:   79 Text Interpretation: Sinus rhythm Atrial premature complexes  RSR' in V1 or V2, right VCD or RVH Abnormal T, consider ischemia, anterior leads When compared to prior, t wave inversions similar to feb 14. No STEMI Confirmed by Antony Blackbird [phone]) on 05/26/2020 5:25:42 PM   Radiology DG Chest 2 View  Result  Date: 05/26/2020 CLINICAL DATA:  Abdominal pain. EXAM: CHEST - 2 VIEW COMPARISON:  April 26, 2020 FINDINGS: Stable right-sided venous Port-A-Cath positioning is seen. The lungs are hyperinflated. Chronic appearing increased lung markings are present. There is no evidence of acute infiltrate, pleural effusion or pneumothorax. The cardiac silhouette is mildly enlarged. Marked severity calcification of the aortic arch is seen. The visualized skeletal structures are unremarkable. IMPRESSION: COPD and cardiomegaly without evidence of acute or active cardiopulmonary disease. Electronically Signed   By: Virgina Norfolk M.D.   On: 05/26/2020 17:59   CT ABDOMEN PELVIS W CONTRAST  Result Date: 05/26/2020 CLINICAL DATA:  History of pancreatic cancer with in case mint of the portal venous confluence, worsening abdominal pain, lower extremity edema EXAM: CT ABDOMEN AND PELVIS WITH CONTRAST TECHNIQUE: Multidetector CT imaging of the abdomen and pelvis was performed using the standard protocol following bolus administration of intravenous contrast. CONTRAST:  88mL OMNIPAQUE IOHEXOL 300 MG/ML  SOLN COMPARISON:  03/31/2020 FINDINGS: Lower chest: Emphysematous changes are seen at the lung bases. No acute airspace disease. Hepatobiliary: New hypodensities are seen within the liver consistent with metastatic disease. Largest in the right lobe image 14/2 measures 2.1 cm. Gallbladder is surgically absent. Stable extrahepatic dilatation of the common bile duct, measuring up to 12 mm. Mild intrahepatic duct dilation unchanged. Pancreas: Hypodense mass within the pancreatic body measures 4.0 x 3.2 cm, increased since prior exam. There is marked extrinsic narrowing of the portal vein  estimated greater than 90%, which has progressed since prior study. The portal vein does remain patent however. Spleen: Normal in size without focal abnormality. Adrenals/Urinary Tract: Stable right renal cysts. Otherwise the kidneys enhance normally and symmetrically. No urinary tract calculi or obstructive uropathy. The adrenals and bladder are unremarkable. Stomach/Bowel: There is no bowel obstruction or ileus. Normal appendix right lower quadrant. Mild diffuse bowel wall thickening is likely due to trace ascites, mesenteric edema, and hypoproteinemic state given marked cachexia. No inflammatory changes. Vascular/Lymphatic: As stated above, there is marked extrinsic compression at the portal venous confluence, with greater than 90% narrowing. 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 The SMV is patent. Diffuse atherosclerosis of the aorta and its branches again noted. IVC is widely patent. No pathologic adenopathy. Reproductive: Stable uterine fibroids.  No adnexal masses. Other: There is trace ascites throughout the abdomen and pelvis. Mild diffuse mesenteric edema. No free intraperitoneal gas. No abdominal wall hernia. Musculoskeletal: Patient is markedly cachectic, and this has progressed since prior study. No acute or destructive bony lesions. Stable spondylosis at the thoracolumbar junction. IMPRESSION: 1. Progression of pancreatic cancer, with enlarging mass in the pancreatic body causing increasing extrinsic mass effect upon the portal vein and chronic occlusion of the splenic vein. 2. Interval development of hepatic metastases as above. 3. Progressive cachexia, with trace abdominal ascites and mesenteric edema. 4. No bowel obstruction or ileus. Mild diffuse bowel wall edema is felt to be due to adjacent ascites and likely hypoproteinemic state. 5. Aortic Atherosclerosis (ICD10-I70.0) and Emphysema (ICD10-J43.9). Electronically Signed   By: Randa Ngo M.D.    On: 05/26/2020 19:35   VAS [place] LOWER EXTREMITY VENOUS (DVT) (ONLY [facility] & [facility])  Result Date: 05/26/2020  Lower Venous DVT Study Indications: Edema.  Risk Factors: Cancer Patient has panreatic cancer. Comparison Study: Previous exam 04/09/20 - negative Performing Technologist: Rogelia Rohrer  Examination Guidelines: A complete evaluation includes B-mode imaging, spectral Doppler, color Doppler, and power Doppler as needed of all accessible portions  of each vessel. Bilateral testing is considered an integral part of a complete examination. Limited examinations for reoccurring indications may be performed as noted. The reflux portion of the exam is performed with the patient in reverse Trendelenburg.  +---------+---------------+---------+-----------+----------+--------------+ RIGHT    CompressibilityPhasicitySpontaneityPropertiesThrombus Aging +---------+---------------+---------+-----------+----------+--------------+ CFV      Full           Yes      Yes                                 +---------+---------------+---------+-----------+----------+--------------+ SFJ      Full                                                        +---------+---------------+---------+-----------+----------+--------------+ FV Prox  Full           Yes      Yes                                 +---------+---------------+---------+-----------+----------+--------------+ FV Mid   Full           Yes      Yes                                 +---------+---------------+---------+-----------+----------+--------------+ FV DistalFull           Yes      Yes                                 +---------+---------------+---------+-----------+----------+--------------+ PFV      Full                                                        +---------+---------------+---------+-----------+----------+--------------+ POP      Full           Yes      Yes                                  +---------+---------------+---------+-----------+----------+--------------+ PTV      Full                                                        +---------+---------------+---------+-----------+----------+--------------+ PERO     Full                                                        +---------+---------------+---------+-----------+----------+--------------+   +---------+---------------+---------+-----------+----------+--------------+ LEFT     CompressibilityPhasicitySpontaneityPropertiesThrombus Aging +---------+---------------+---------+-----------+----------+--------------+ CFV      Full  Yes      Yes                                 +---------+---------------+---------+-----------+----------+--------------+ SFJ      Full                                                        +---------+---------------+---------+-----------+----------+--------------+ FV Prox  Full           Yes      Yes                                 +---------+---------------+---------+-----------+----------+--------------+ FV Mid   Full           Yes      Yes                                 +---------+---------------+---------+-----------+----------+--------------+ FV DistalFull           Yes      Yes                                 +---------+---------------+---------+-----------+----------+--------------+ PFV      Full                                                        +---------+---------------+---------+-----------+----------+--------------+ POP      Full           Yes      Yes                                 +---------+---------------+---------+-----------+----------+--------------+ PTV      Full                                                        +---------+---------------+---------+-----------+----------+--------------+ PERO     Full                                                         +---------+---------------+---------+-----------+----------+--------------+     Summary: BILATERAL: - No evidence of deep vein thrombosis seen in the lower extremities, bilaterally. - No evidence of superficial venous thrombosis in the lower extremities, bilaterally. -No evidence of popliteal cyst, bilaterally.   *See table(s) above for measurements and observations.    Preliminary     Procedures Procedures   Medications Ordered in ED Medications  HYDROmorphone (DILAUDID) injection 1 mg (1 mg Intravenous Given 05/26/20 1703)  sodium chloride 0.9 % bolus 500 mL (0 mLs Intravenous Stopped 05/26/20 1741)  iohexol (OMNIPAQUE) 300 MG/ML solution 100 mL (80 mLs Intravenous Contrast Given 05/26/20 1851)  HYDROmorphone (DILAUDID) injection 1 mg (1 mg Intravenous Given 05/26/20 1952)  HYDROmorphone (DILAUDID) injection 1 mg (1 mg Intravenous Given 05/26/20 2158)  heparin lock flush 100 unit/mL (500 Units Intracatheter Given 05/26/20 2206)    ED Course  I have reviewed the triage vital signs and the nursing notes.  Pertinent labs & imaging results that were available during my care of the patient were reviewed by me and considered in my medical decision making (see chart for details).    MDM Rules/Calculators/A&P                          LIBI CORSO is a 71 y.o. female with a past medical history significant for atrial flutter on Eliquis therapy, COPD, hypertension, hyperlipidemia, and pancreatic adenocarcinoma currently getting infusion therapy who presents from the cancer center for further evaluation of severe worsened abdominal pain, some diarrhea, and worsened bilateral lower extremity edema.  Patient reports that she was diagnosed with pancreatic cancer and has been getting infusion therapy treatments.  She reports that she went to the cancer center today and was sent over for further evaluation.  She reports that last night, her pain was under control but she reports this morning the pain rapidly  worsened and is now 9 out of 10.  She is doubled over crying in pain.  She reports the pain is all over her abdomen including the upper abdomen.  She reports it is a sharpness.  She reports this feels different than previous pain she has had.  She says that her legs started swelling significantly yesterday which is different than it has been in the past.  She denies any chest pain or shortness of breath but does report the pain in her upper abdomen does go to the lower chest area.  She denies fevers, chills congestion, or cough.  She reports a decreased appetite with this pain.  She reports some diarrhea but denies any constipation.  Denies any urinary changes.  Denies trauma.  On exam, patient is clearly uncomfortable and has severe abdominal tenderness.  Bowel sounds were appreciated.  No back tenderness or flank tenderness.  Lungs clear.  Chest nontender.  Legs have pitting edema in both legs but I did feel a palpable DP pulses.  She had intact sensation and strength in both legs.  Clinically I am concerned that the patient's cancer may be causing a more critical stenosis of her venous system causing more acute swelling in the legs.  I am also concerned that the cancer is contributing to her severe abdominal pain.  It appears the cancer team has ordered CT abdomen pelvis with and without contrast to further evaluate so we will get that today to help delineate the new cause of pain.  We will give Dilaudid for pain control and give her some fluids as she feels dehydrated and has been having diarrhea.  Will check electrolytes and labs.  Anticipate admission for further management of her severe abdominal pain.  Patient's work-up returned similar to prior.  Covid and flu test negative.  Troponin negative.  BNP slightly elevated but otherwise labs are similar to prior.  Ultrasound is not show DVT and CT scan showed worsened tumor burden but no acute evidence of obstruction or other acute abnormality.  On  reassessment, patient is feeling much better.  We discussed that due to her severe pain  earlier, we offered her admission for further pain management however patient would rather follow-up with her oncology team and discuss outpatient management.  She was able to eat and drink and feel better after several doses of Dilaudid and some fluids.  She would like to go home now.  Patient discharged in good condition with understanding of oncology follow-up with PCP follow-up and understood return precautions.   Final Clinical Impression(s) / ED Diagnoses Final diagnoses:  Generalized abdominal pain    Rx / DC Orders ED Discharge Orders    None      Clinical Impression: 1. Generalized abdominal pain     Disposition: Discharge  Condition: Good  I have discussed the results, Dx and Tx plan with the pt(& family if present). He/she/they expressed understanding and agree(s) with the plan. Discharge instructions discussed at great length. Strict return precautions discussed and pt &/or family have verbalized understanding of the instructions. No further questions at time of discharge.    New Prescriptions   No medications on file    Follow Up: Nickola Major, MD [id number] [place] [address] [place] [id number] [phone]     [facility]-EMERGENCY DEPT [place] [id number] [place]       Ayan Yankey, Gwenyth Allegra, MD 05/26/20 2217

## 2020-05-26 NOTE — ED Notes (Signed)
Coming from cancer center-states history of pancreatic cancer-states uncontrolled abdominal pain

## 2020-05-26 NOTE — Progress Notes (Signed)
[facility] Progress Note   Rebecca Weaver [id number] [birth year] 70 y.o.  Rebecca Weaver is managed by Dr. Dominica Severin B. Sherrill  Actively treated with chemotherapy/immunotherapy/hormonal therapy: yes  Current therapy: FOLFIRINOX  Last treated: 05/20/2020 (cycle 3)  Next scheduled appointment with provider: 06/03/2020  Assessment: Plan:    Pancreatic adenocarcinoma ([place])  Dehydration   Locally advanced pancreatic adenocarcinoma: The patient continues to be managed by Dr. Dominica Severin B. Sherrill and is status post cycle 3 of FOLFIRINOX which was dosed on 05/20/2020.  She is scheduled to return for follow-up on 06/03/2020.  Dehydration: The patient was given 1 L of normal saline IV.  Diarrhea: She has had diarrhea for the past 3 days which is improving with her use of Lomotil.  Abdominal pain: She is having significant abdominal pain and was given morphine sulfate 2 mg IV x3 doses with little benefit.  She continues taking MS Contin 100 mg twice daily OxyContin IR 10 mg every 4 hours as needed.  She was taken to the emergency room for evaluation and management.  Please see After Visit Summary for patient specific instructions.  Future Appointments  Date Time Provider [place]  05/26/2020  2:00 PM Britain, Anagnos., PA-C [facility] None  05/26/2020  2:00 PM SYMPTOM MANAGEMENT CLIN[facility] 1 [facility] None  05/31/2020  1:20 PM O'Neal, Cassie Freer, MD [facility]  06/03/2020  7:45 AM [facility] LAB [facility] None  06/03/2020  8:00 AM [facility] INFUSION [facility] None  06/03/2020  8:30 AM Ladell Pier, MD [facility] None  06/03/2020  9:30 AM [facility] INFUSION [facility] None  06/05/2020  8:00 AM [facility] INFUSION [facility] None  06/17/2020  8:45 AM [facility] PHLEBOTOMIST [facility] None  06/17/2020  9:20 AM Ladell Pier, MD [facility] None  06/17/2020 10:00 AM [facility] CHAIR 2 [facility] None    No orders of the defined types were placed in this  encounter.      Subjective:   Patient ID:  Rebecca Weaver is a 70 y.o. (DOB 1950-05-29) female.  Chief Complaint: No chief complaint on file.   HPI Rebecca Weaver  is a 70 y.o. female with a diagnosis of a locally advanced pancreatic adenocarcinoma with a CT scan from 03/31/2020 showing an increase in size of the pancreas body/tail mass with progressive encasement of the portal venous confluence, posterior extension to involve the SMA without complete encasement.  There was no evidence of metastatic disease.  She completed cycle 5 of gemcitabine and Abraxane on 04/01/2020 and was then changed to FOLFIRINOX with cycle 1 dosed on 04/21/2020.  Cycle #3 was completed on 05/20/2020.  Rebecca Weaver husband contacted her office on 05/25/2020 asking if the patient could receive IV fluids this week.  He reported that she was not taking a lot of fluids.  She has been having diarrhea since Sunday.  It is mildly improved with the use of Lomotil.  She is having significant abdominal pain despite taking MS Contin 100 mg every 12 hours and OxyIR 10 mg every 4 hours.  She describes her abdominal pain is being in her left upper quadrant and is stabbing in nature.  She has only been able to intake sips of water since this weekend.  She denies nausea or vomiting.  She is extremely weak.  She was given IV morphine 2 mg x 3 doses without improvement in her pain.  She was taken to the emergency room for evaluation and management.   Medications: I have reviewed the patient's  current medications.  Allergies:  Allergies  Allergen Reactions  . Atorvastatin     Body aches  . Ciprofloxacin     Per patient  "swollen tongue and breathing problems"   . Sulfa Antibiotics     Per patient "swollen tongue and breathing problems"    Past Medical History:  Diagnosis Date  . Anemia   . Arrhythmia    Atrial fibrillation s/p TEE/DCCV 01/29/2020  . Arthritis   . COPD (chronic obstructive pulmonary disease) ([place])   .  Coronary artery calcification   . Family history of breast cancer   . Family history of colon cancer   . GERD (gastroesophageal reflux disease)   . Hyperlipidemia   . Hypertension   . Lung nodules   . Osteopenia   . Pancreatic cancer ([place])   . Pseudotumor cerebri     Past Surgical History:  Procedure Laterality Date  . CARDIOVERSION N/A 01/29/2020   Procedure: CARDIOVERSION;  Surgeon: Buford Dresser, MD;  Location: [facility] ENDOSCOPY;  Service: Cardiovascular;  Laterality: N/A;  . CHOLECYSTECTOMY    . ESOPHAGOGASTRODUODENOSCOPY (EGD) WITH PROPOFOL N/A 01/02/2020   Procedure: ESOPHAGOGASTRODUODENOSCOPY (EGD) WITH PROPOFOL;  Surgeon: [name], MD;  Location: [facility] ENDOSCOPY;  Service: Endoscopy;  Laterality: N/A;  . FINE NEEDLE ASPIRATION N/A 01/02/2020   Procedure: FINE NEEDLE ASPIRATION (FNA) LINEAR;  Surgeon: [name], MD;  Location: [facility] ENDOSCOPY;  Service: Endoscopy;  Laterality: N/A;  . IR IMAGING GUIDED PORT INSERTION  01/20/2020  . TEE WITHOUT CARDIOVERSION N/A 01/29/2020   Procedure: TRANSESOPHAGEAL ECHOCARDIOGRAM (TEE);  Surgeon: Buford Dresser, MD;  Location: [place];  Service: Cardiovascular;  Laterality: N/A;  . TUBAL LIGATION    . UPPER ESOPHAGEAL ENDOSCOPIC ULTRASOUND (EUS) N/A 01/02/2020   Procedure: UPPER ESOPHAGEAL ENDOSCOPIC ULTRASOUND (EUS);  Surgeon: [name], MD;  Location: [facility] END[facility];  Service: Endoscopy;  Laterality: N/A;  . VARICOSE VEIN SURGERY     multiple    Family History  Problem Relation Age of Onset  . Varicose Veins Father   . Heart disease Father   . Varicose Veins Sister     Social History   Socioeconomic History  . Marital status: Married    Spouse name: Not on file  . Number of children: 2  . Years of education: Not on file  . Highest education level: Not on file  Occupational History  . Occupation: retired - Development worker, community  Tobacco Use  . Smoking status: Current Every Day Smoker    Packs/day: 0.75     Years: 50.00    Pack years: 37.50    Types: Cigarettes  . Smokeless tobacco: Never Used  Substance and Sexual Activity  . Alcohol use: Yes    Alcohol/week: 0.0 standard drinks    Comment: social occassions only  . Drug use: Not Currently  . Sexual activity: Not on file  Other Topics Concern  . Not on file  Social History Narrative  . Not on file   Social Determinants of Health   Financial Resource Strain: Low Risk   . Difficulty of Paying Living Expenses: Not hard at all  Food Insecurity: Not on file  Transportation Needs: No Transportation Needs  . Lack of Transportation (Medical): No  . Lack of Transportation (Non-Medical): No  Physical Activity: Insufficiently Active  . Days of Exercise per Week: 5 days  . Minutes of Exercise per Session: 20 min  Stress: No Stress Concern Present  . Feeling of Stress : Not at all  Social Connections: Unknown  .  Frequency of Communication with Friends and Family: More than three times a week  . Frequency of Social Gatherings with Friends and Family: More than three times a week  . Attends Religious Services: Not on file  . Active Member of Clubs or Organizations: Not on file  . Attends Archivist Meetings: Not on file  . Marital Status: Married  Human resources officer Violence: Not on file    Past Medical History, Surgical history, Social history, and Family history were reviewed and updated as appropriate.   Please see review of systems for further details on the patient's review from today.   Review of Systems:  Review of Systems  Constitutional: Positive for appetite change. Negative for chills, diaphoresis and fever.  HENT: Negative for trouble swallowing and voice change.   Respiratory: Negative for cough, chest tightness, shortness of breath and wheezing.   Cardiovascular: Negative for chest pain and palpitations.  Gastrointestinal: Positive for abdominal pain and diarrhea. Negative for constipation, nausea and vomiting.   Musculoskeletal: Negative for back pain and myalgias.  Neurological: Positive for weakness. Negative for dizziness, light-headedness and headaches.    Objective:   Physical Exam:  There were no vitals taken for this visit. ECOG: 0  Physical Exam Constitutional:      General: She is not in acute distress.    Appearance: She is not diaphoretic.  HENT:     Head: Normocephalic and atraumatic.  Cardiovascular:     Rate and Rhythm: Normal rate and regular rhythm.     Heart sounds: Normal heart sounds. No murmur heard. No friction rub. No gallop.   Pulmonary:     Effort: Pulmonary effort is normal. No respiratory distress.     Breath sounds: Normal breath sounds. No wheezing or rales.  Abdominal:     General: Bowel sounds are decreased.  Skin:    General: Skin is warm and dry.     Findings: No erythema or rash.  Neurological:     Mental Status: She is alert.   Unable to complete a thorough abdominal exam due to the patient's pain.  Lab Review:     Component Value Date/Time   NA 137 05/20/2020 1047   NA 136 01/26/2020 1215   K 3.7 05/20/2020 1047   CL 103 05/20/2020 1047   CO2 25 05/20/2020 1047   GLUCOSE 100 (H) 05/20/2020 1047   BUN 12 05/20/2020 1047   BUN 8 01/26/2020 1215   CREATININE 0.56 05/20/2020 1047   CALCIUM 8.5 (L) 05/20/2020 1047   PROT 5.2 (L) 05/20/2020 1047   ALBUMIN 2.7 (L) 05/20/2020 1047   AST 13 (L) 05/20/2020 1047   ALT 10 05/20/2020 1047   ALKPHOS 87 05/20/2020 1047   BILITOT 0.3 05/20/2020 1047   GFRNONAA >60 05/20/2020 1047   GFRAA 97 01/26/2020 1215       Component Value Date/Time   WBC 3.9 (L) 05/20/2020 1047   WBC 3.9 (L) 04/26/2020 1055   RBC 3.30 (L) 05/20/2020 1047   HGB 10.8 (L) 05/20/2020 1047   HGB 16.0 (H) 01/26/2020 1215   HCT 31.5 (L) 05/20/2020 1047   HCT 46.0 01/26/2020 1215   PLT 152 05/20/2020 1047   PLT 246 01/26/2020 1215   MCV 95.5 05/20/2020 1047   MCV 97 01/26/2020 1215   MCH 32.7 05/20/2020 1047   MCHC 34.3  05/20/2020 1047   RDW 14.4 05/20/2020 1047   RDW 11.7 01/26/2020 1215   LYMPHSABS 1.1 05/20/2020 1047   LYMPHSABS 1.7 01/14/2020 1509  MONOABS 0.9 05/20/2020 1047   EOSABS 0.3 05/20/2020 1047   EOSABS 0.2 01/14/2020 1509   BASOSABS 0.0 05/20/2020 1047   BASOSABS 0.1 01/14/2020 1509   -------------------------------  Imaging from last 24 hours (if applicable):  Radiology interpretation: No results found.       This case was discussed with Dr. Benay Spice. He expressed agreement with my management of this patient.

## 2020-05-26 NOTE — Discharge Instructions (Signed)
Your work-up today showed your symptoms are likely due to your cancer causing all the abdominal pain.  There is no evidence of acute obstruction or acute infection.  Your labs were all relatively similar to prior and we did not see any acute reason to need to admit you.  We discussed admission for pain control however given your pain improvement, we feel you are safe to go home with close follow-up.  Please call your PCP on your oncology team to follow-up.  If any symptoms change acutely, please return to the nearest emergency department.

## 2020-05-27 ENCOUNTER — Telehealth: Payer: Self-pay | Admitting: Medical

## 2020-05-27 ENCOUNTER — Telehealth: Payer: Self-pay | Admitting: *Deleted

## 2020-05-27 NOTE — Telephone Encounter (Signed)
Checked out appointment. No LOS notes needing to be scheduled. No changes made. 

## 2020-05-27 NOTE — Telephone Encounter (Signed)
Daughter called to request MD call them to discuss the recent CT scan results.  Informed that MD will call them by the days end. She is still having diarrhea (X3 thus far today)-only taking Lomotil #1 tablet at a time. Still has reflux of gastric contents w/no warning or sense of nausea prior. Requesting to be seen sooner than 3/24 if possible. Per Dr. Benay Spice: Take #2 Lomotil after diarrhea and can alternate with Imodium. OK to take Maalox or Mylanta in addition to the pantoprazole 40 mg daily. Will have her seen here on 3/21--scheduling message sent.

## 2020-05-28 ENCOUNTER — Telehealth: Payer: Self-pay

## 2020-05-28 ENCOUNTER — Telehealth: Payer: Self-pay | Admitting: *Deleted

## 2020-05-28 ENCOUNTER — Telehealth: Payer: Self-pay | Admitting: Oncology

## 2020-05-28 NOTE — Telephone Encounter (Signed)
Scheduled per 03/18 scheduled message, patient has been called and notified.

## 2020-05-28 NOTE — Telephone Encounter (Signed)
Called to report she is having continued significant reflux despite pantoprazole and sips of Mylanta. She is not taking much po's now. Anything else to add to help. Also is having dramatic changes--having an anxiety and restlessness she has not had before. Several minutes later, her other daughter called [facility] to report a rapid decline now: more lethargic and her skin is ice cold. Asking or emergent referral to Hospice. OK per Dr. Benay Spice.  Call ed Horris Latino at [facility] with verbal order for emergency Hospice referral--patient appears to be actively dying in home now and family is in panic mode. Dr. Benay Spice will be her attending. Hospice was discussed w/family last night and they agreed. They will get nurse out this evening.

## 2020-05-28 NOTE — Telephone Encounter (Signed)
Left voice message for patient's daughter that Dr. Gearldine Shown nurse Manuela Schwartz has put in for an emergency referral to Hospice.  She was told they are going to try to come out this evening.  I left my direct number for her to call back if she has any questions.

## 2020-05-30 NOTE — Progress Notes (Deleted)
Cardiology Office Note:   Date:  05/30/2020  NAME:  Rebecca Weaver    MRN: [id number] DOB:  25-Jun-1950   PCP:  Nickola Major, MD  Cardiologist:  Evalina Field, MD  Electrophysiologist:  None   Referring MD: Nickola Major, MD   No chief complaint on file. ***  History of Present Illness:   Rebecca Weaver is a 70 y.o. female with a hx of pancreatic CA, COPD, HTN, Aflutter s/p DCCV who presents for follow-up.    Problem List 1. COPD 2. HTN 3. Pancreatic adenocarcinoma  -w/ progression causing extrinsic compression 4. Atrial flutter -Dx 12/2019 -CHADSVASC=3 (age, sex, HTN) -Ef 50-55% -TEE/DCCV 01/29/2020 5. Tobacco abuse  Past Medical History: Past Medical History:  Diagnosis Date  . Anemia   . Arrhythmia    Atrial fibrillation s/p TEE/DCCV 01/29/2020  . Arthritis   . COPD (chronic obstructive pulmonary disease) ([place])   . Coronary artery calcification   . Family history of breast cancer   . Family history of colon cancer   . GERD (gastroesophageal reflux disease)   . Hyperlipidemia   . Hypertension   . Lung nodules   . Osteopenia   . Pancreatic cancer (Rebecca Weaver)   . Pseudotumor cerebri     Past Surgical History: Past Surgical History:  Procedure Laterality Date  . CARDIOVERSION N/A 01/29/2020   Procedure: CARDIOVERSION;  Surgeon: Buford Dresser, MD;  Location: [facility] ENDOSCOPY;  Service: Cardiovascular;  Laterality: N/A;  . CHOLECYSTECTOMY    . ESOPHAGOGASTRODUODENOSCOPY (EGD) WITH PROPOFOL N/A 01/02/2020   Procedure: ESOPHAGOGASTRODUODENOSCOPY (EGD) WITH PROPOFOL;  Surgeon: [name], MD;  Location: [facility] ENDOSCOPY;  Service: Endoscopy;  Laterality: N/A;  . FINE NEEDLE ASPIRATION N/A 01/02/2020   Procedure: FINE NEEDLE ASPIRATION (FNA) LINEAR;  Surgeon: [name], MD;  Location: [facility] ENDOSCOPY;  Service: Endoscopy;  Laterality: N/A;  . IR IMAGING GUIDED PORT INSERTION  01/20/2020  . TEE WITHOUT CARDIOVERSION N/A 01/29/2020   Procedure:  TRANSESOPHAGEAL ECHOCARDIOGRAM (TEE);  Surgeon: Buford Dresser, MD;  Location: [place];  Service: Cardiovascular;  Laterality: N/A;  . TUBAL LIGATION    . UPPER ESOPHAGEAL ENDOSCOPIC ULTRASOUND (EUS) N/A 01/02/2020   Procedure: UPPER ESOPHAGEAL ENDOSCOPIC ULTRASOUND (EUS);  Surgeon: [name], MD;  Location: [facility]OS[facility];  Service: Endoscopy;  Laterality: N/A;  . VARICOSE VEIN SURGERY     multiple    Current Medications: No outpatient medications have been marked as taking for the 05/31/20 encounter (Appointment) with O'Neal, Cassie Freer, MD.     Allergies:    Atorvastatin, Ciprofloxacin, and Sulfa antibiotics   Social History: Social History   Socioeconomic History  . Marital status: Married    Spouse name: Not on file  . Number of children: 2  . Years of education: Not on file  . Highest education level: Not on file  Occupational History  . Occupation: retired - Development worker, community  Tobacco Use  . Smoking status: Current Every Day Smoker    Packs/day: 0.75    Years: 50.00    Pack years: 37.50    Types: Cigarettes  . Smokeless tobacco: Never Used  Substance and Sexual Activity  . Alcohol use: Yes    Alcohol/week: 0.0 standard drinks    Comment: social occassions only  . Drug use: Not Currently  . Sexual activity: Not on file  Other Topics Concern  . Not on file  Social History Narrative  . Not on file   Social Determinants of Health   Financial Resource Strain: Low  Risk   . Difficulty of Paying Living Expenses: Not hard at all  Food Insecurity: Not on file  Transportation Needs: No Transportation Needs  . Lack of Transportation (Medical): No  . Lack of Transportation (Non-Medical): No  Physical Activity: Insufficiently Active  . Days of Exercise per Week: 5 days  . Minutes of Exercise per Session: 20 min  Stress: No Stress Concern Present  . Feeling of Stress : Not at all  Social Connections: Unknown  . Frequency of Communication with Friends  and Family: More than three times a week  . Frequency of Social Gatherings with Friends and Family: More than three times a week  . Attends Religious Services: Not on file  . Active Member of Clubs or Organizations: Not on file  . Attends Archivist Meetings: Not on file  . Marital Status: Married     Family History: The patient's ***family history includes Heart disease in her father; Varicose Veins in her father and sister.  ROS:   All other ROS reviewed and negative. Pertinent positives noted in the HPI.     EKGs/Labs/Other Studies Reviewed:   The following studies were personally reviewed by me today:  EKG:  EKG is *** ordered today.  The ekg ordered today demonstrates ***, and was personally reviewed by me.   Recent Labs: 12/30/2019: TSH 2.679 05/26/2020: ALT 13; B Natriuretic Peptide 122.1; BUN 14; Creatinine, Ser 0.65; Hemoglobin 10.4; Magnesium 1.5; Platelets 76; Potassium 3.8; Sodium 137   Recent Lipid Panel No results found for: CHOL, TRIG, HDL, CHOLHDL, VLDL, LDLCALC, LDLDIRECT  Physical Exam:   VS:  There were no vitals taken for this visit.   Wt Readings from Last 3 Encounters:  05/26/20 121 lb 12.8 oz (55.2 kg)  05/20/20 121 lb 4.8 oz (55 kg)  05/14/20 117 lb 3.2 oz (53.2 kg)    General: Well nourished, well developed, in no acute distress Head: Atraumatic, normal size  Eyes: PEERLA, EOMI  Neck: Supple, no JVD Endocrine: No thryomegaly Cardiac: Normal S1, S2; RRR; no murmurs, rubs, or gallops Lungs: Clear to auscultation bilaterally, no wheezing, rhonchi or rales  Abd: Soft, nontender, no hepatomegaly  Ext: No edema, pulses 2+ Musculoskeletal: No deformities, BUE and BLE strength normal and equal Skin: Warm and dry, no rashes   Neuro: Alert and oriented to person, place, time, and situation, CNII-XII grossly intact, no focal deficits  Psych: Normal mood and affect   ASSESSMENT:   Rebecca Weaver is a 70 y.o. female who presents for the  following: No diagnosis found.  PLAN:   There are no diagnoses linked to this encounter.  Disposition: No follow-ups on file.  Medication Adjustments/Labs and Tests Ordered: Current medicines are reviewed at length with the patient today.  Concerns regarding medicines are outlined above.  No orders of the defined types were placed in this encounter.  No orders of the defined types were placed in this encounter.   There are no Patient Instructions on file for this visit.   Time Spent with Patient: I have spent a total of *** minutes with patient reviewing hospital notes, telemetry, EKGs, labs and examining the patient as well as establishing an assessment and plan that was discussed with the patient.  > 50% of time was spent in direct patient care.  Signed, Addison Naegeli. Audie Box, MD, [place]  [address] [place], [place] [address] [phone]  05/30/2020 2:52 PM

## 2020-05-31 ENCOUNTER — Inpatient Hospital Stay: Payer: Medicare Other

## 2020-05-31 ENCOUNTER — Inpatient Hospital Stay: Payer: Medicare Other | Admitting: Oncology

## 2020-05-31 ENCOUNTER — Ambulatory Visit: Payer: Medicare Other | Admitting: Cardiovascular Disease

## 2020-05-31 DIAGNOSIS — I10 Essential (primary) hypertension: Secondary | ICD-10-CM

## 2020-05-31 DIAGNOSIS — E782 Mixed hyperlipidemia: Secondary | ICD-10-CM

## 2020-05-31 DIAGNOSIS — I4892 Unspecified atrial flutter: Secondary | ICD-10-CM

## 2020-06-03 ENCOUNTER — Other Ambulatory Visit: Payer: Medicare Other

## 2020-06-03 ENCOUNTER — Ambulatory Visit: Payer: Medicare Other

## 2020-06-03 ENCOUNTER — Ambulatory Visit: Payer: Medicare Other | Admitting: Oncology

## 2020-06-05 ENCOUNTER — Ambulatory Visit: Payer: Medicare Other

## 2020-06-11 DEATH — deceased

## 2020-06-17 ENCOUNTER — Ambulatory Visit: Payer: Medicare Other | Admitting: Oncology

## 2020-06-17 ENCOUNTER — Ambulatory Visit: Payer: Medicare Other

## 2020-06-17 ENCOUNTER — Other Ambulatory Visit: Payer: Medicare Other

## 2022-01-21 IMAGING — CR DG CHEST 2V
2 series · 2 of 2 positions shown · non-contrast
Comparison: 12/29/2019 and PET-CT of 01/21/2020

CLINICAL DATA: Dyspnea and COPD. Pancreatic adenocarcinoma.
Pulmonary nodules.

EXAM:
CHEST - 2 VIEW

[w chest pa]
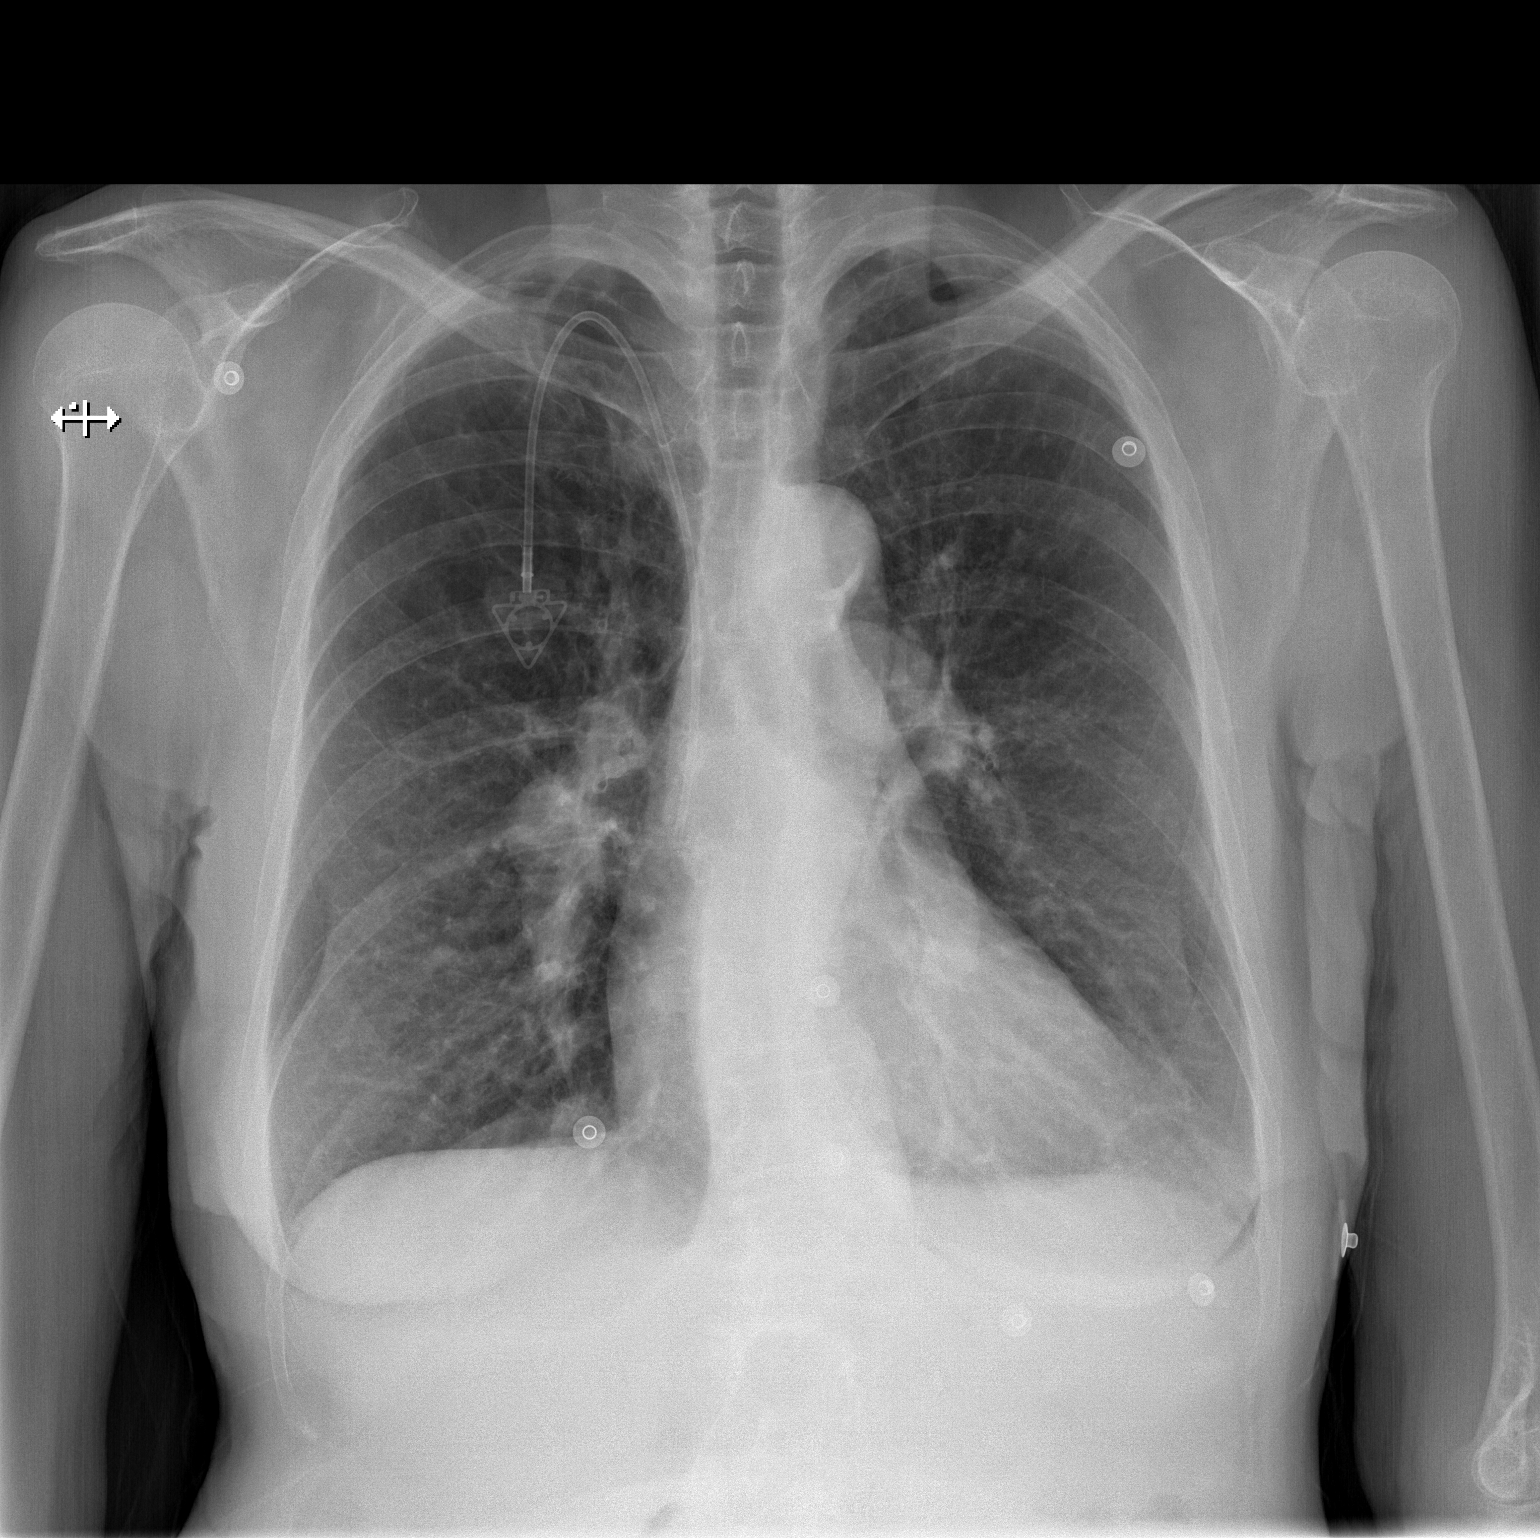

[w chest lat]
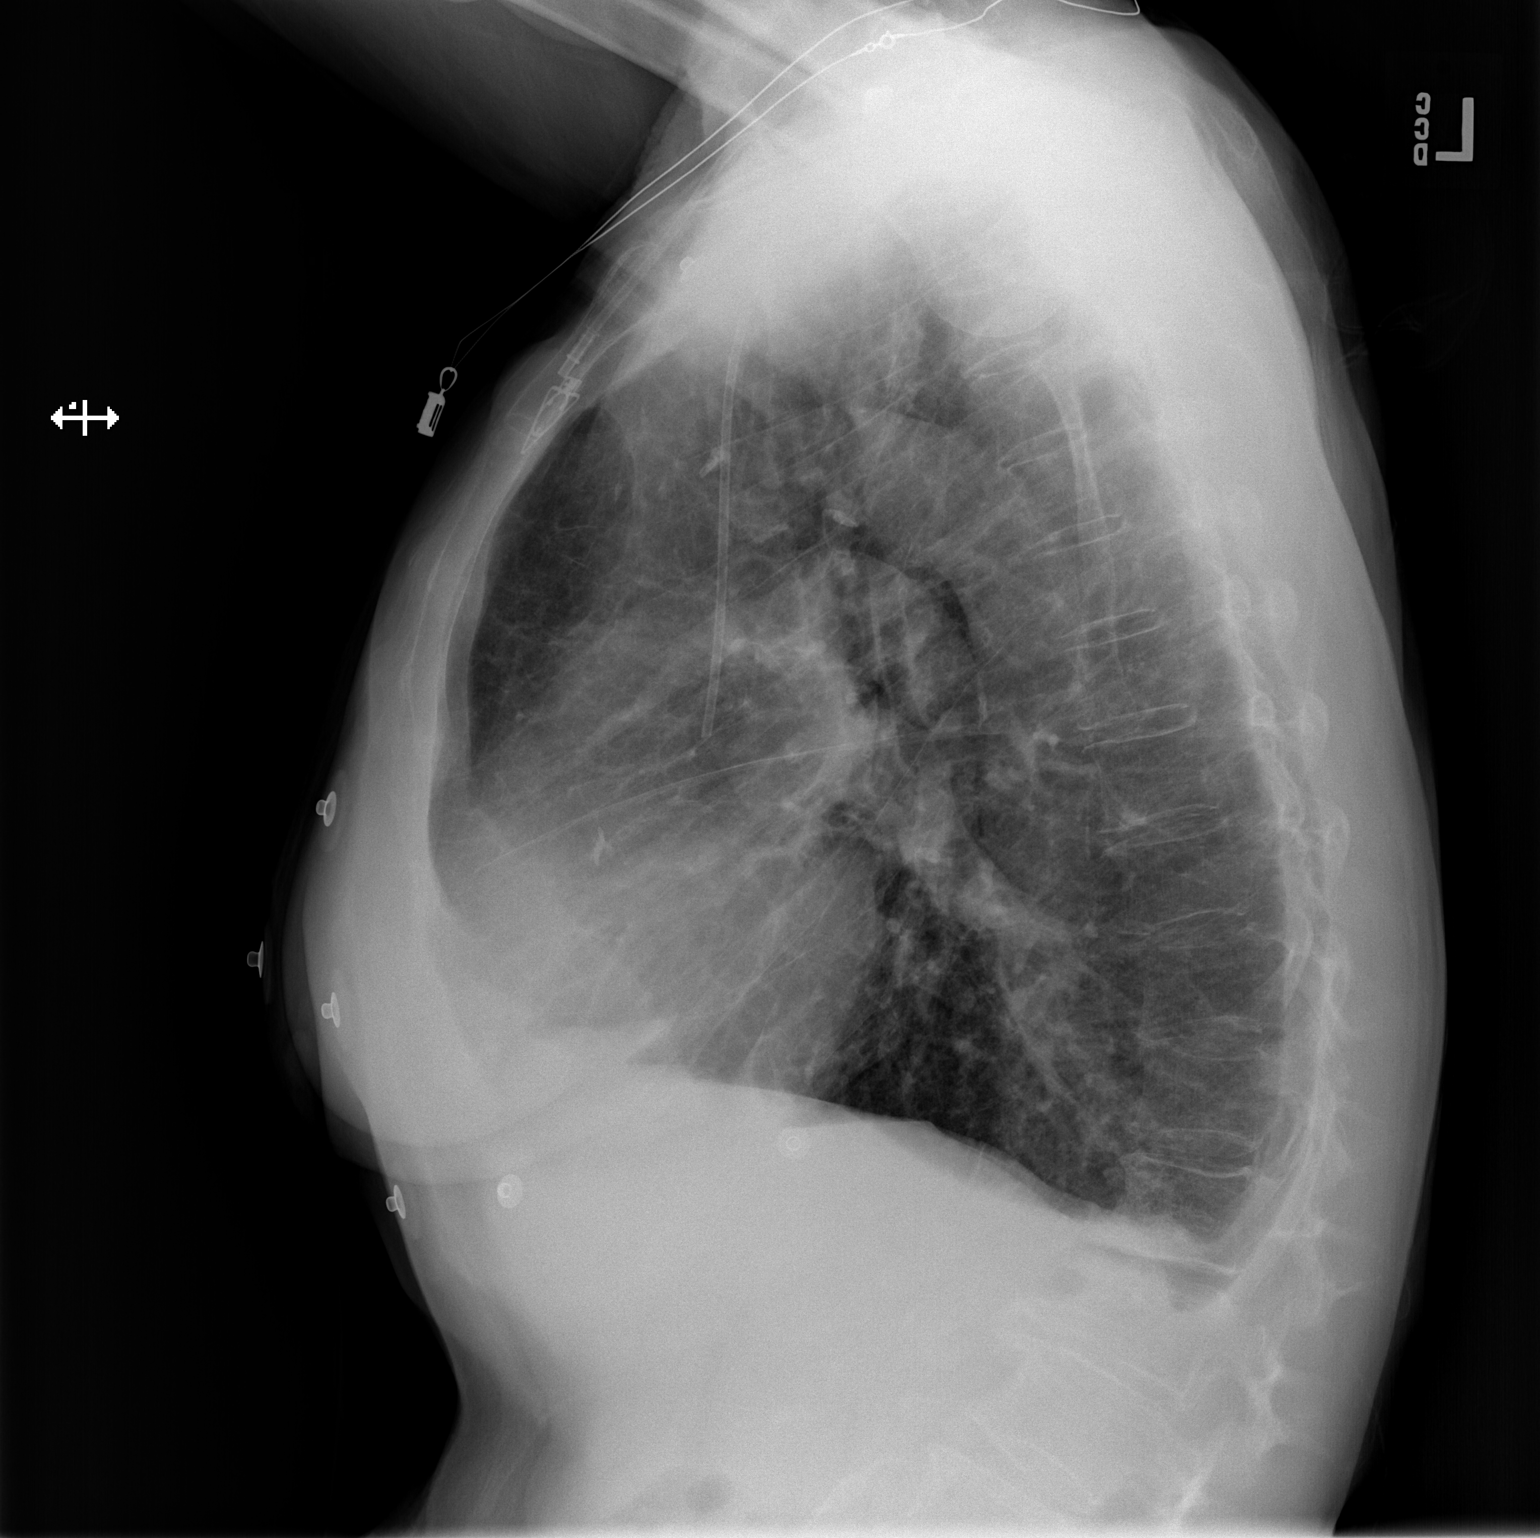

[2 of 2 positions shown; findings below may reference images not displayed]

FINDINGS: The known right upper lobe pulmonary nodule at about the vertical
level of the Port-A-Cath port is faintly seen. Underlying emphysema.
Power injectable Port-A-Cath tip: SVC.

Atherosclerotic calcification of the aortic arch. Mild biapical
pleuroparenchymal scarring. Lower thoracic and upper lumbar
spondylosis.
IMPRESSION: 1. Known right upper lobe pulmonary nodule is faintly seen.
2. Aortic Atherosclerosis (6S3L5-PAF.F) and Emphysema (6S3L5-4P5.T).

## 2022-02-20 IMAGING — CT CT ABD-PELV W/ CM
2 of 5 series · 15 of 46 positions shown, 17 images · IV contrast (omnipaque)
Comparison: 03/31/2020

CLINICAL DATA: History of pancreatic cancer with in case mint of
the portal venous confluence, worsening abdominal pain, lower
extremity edema

EXAM:
CT ABDOMEN AND PELVIS WITH CONTRAST
TECHNIQUE: Multidetector CT imaging of the abdomen and pelvis was performed
using the standard protocol following bolus administration of
intravenous contrast.
CONTRAST:  80mL OMNIPAQUE IOHEXOL 300 MG/ML  SOLN

[Series 2: axial st · axial · 0.74mm/px · z∈[+1058,+1423]mm · 12 of 85 slices shown, 14 images]
[im 6/85  soft-tissue]
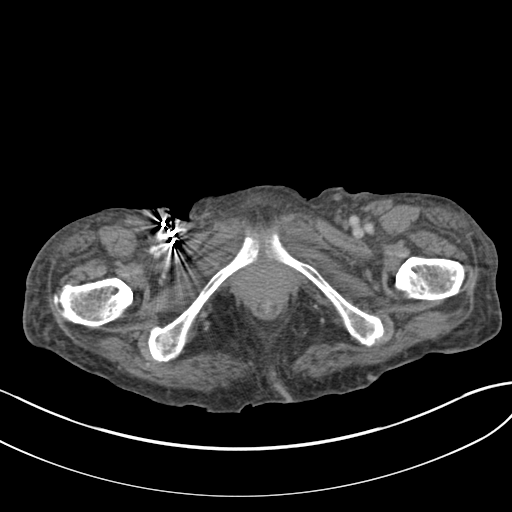
[im 6/85  bone]
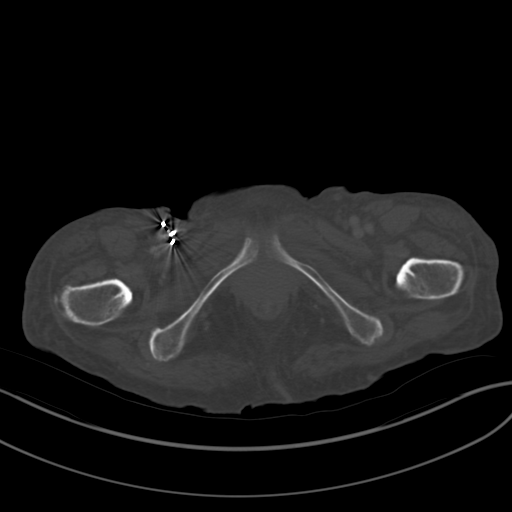
[im 12/85  soft-tissue]
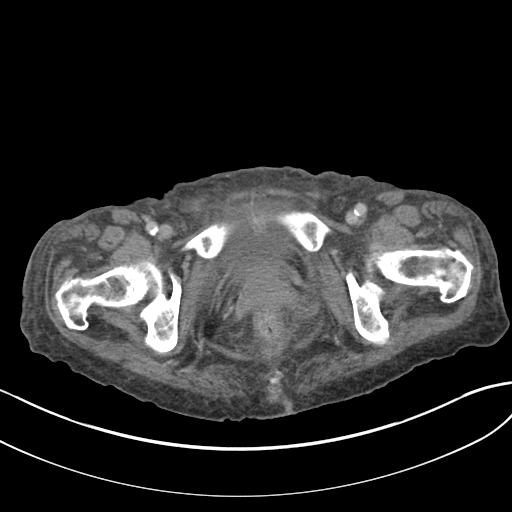
[im 17/85  soft-tissue]
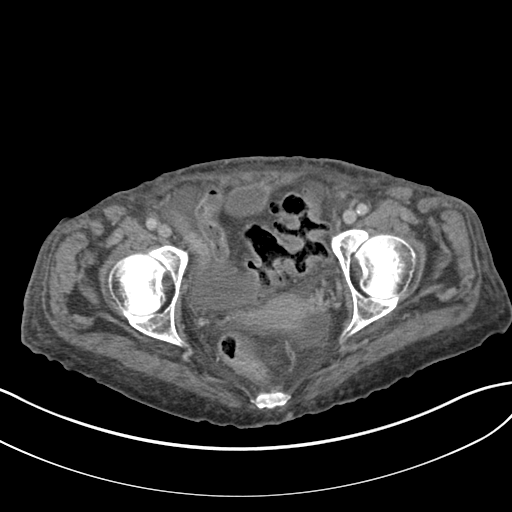
[im 29/85  soft-tissue]
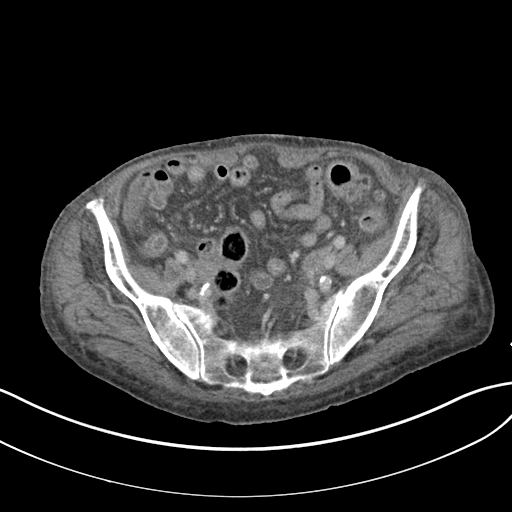
[im 34/85  soft-tissue]
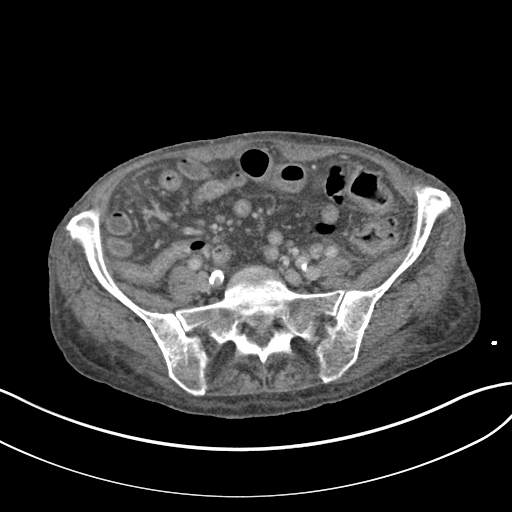
[im 40/85  soft-tissue]
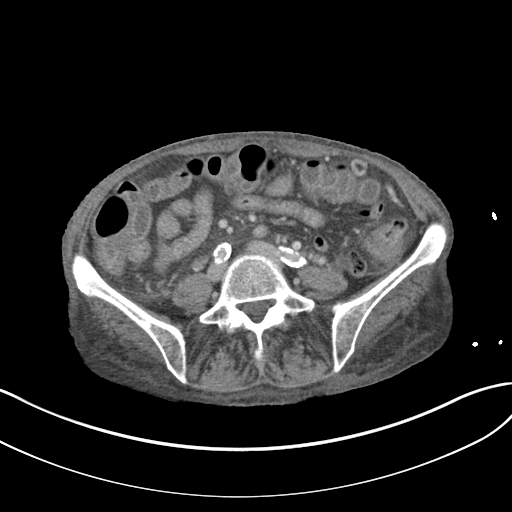
[im 45/85  soft-tissue]
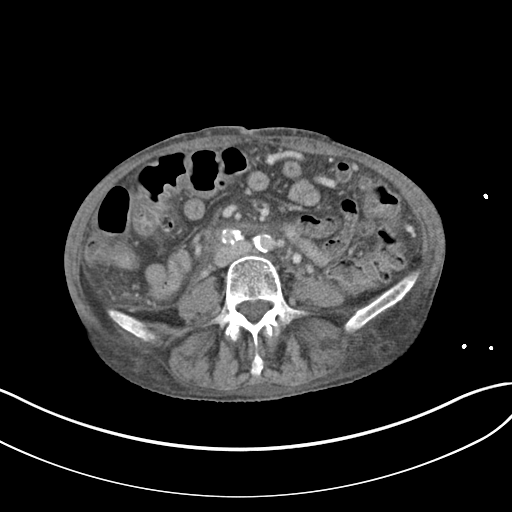
[im 51/85  soft-tissue]
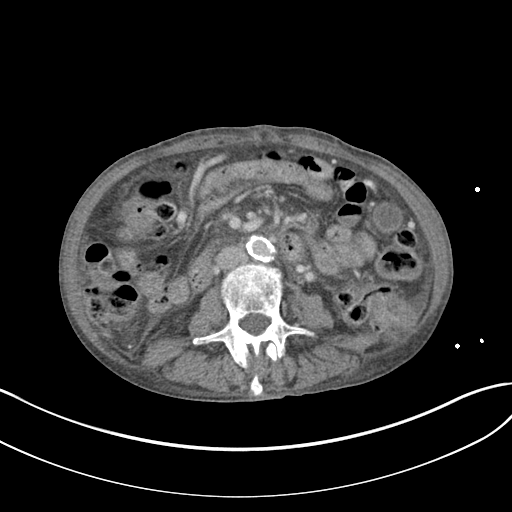
[im 57/85  soft-tissue]
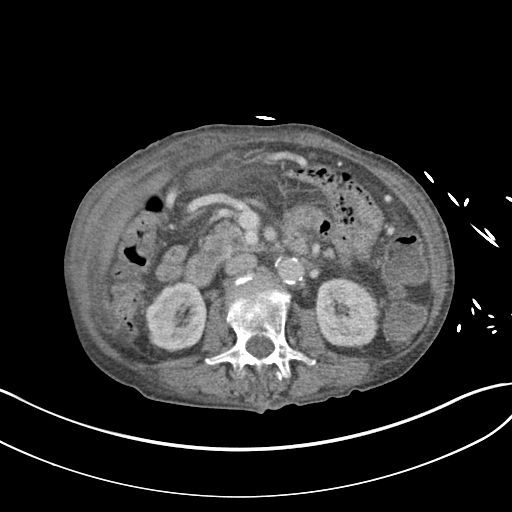
[im 57/85  bone]
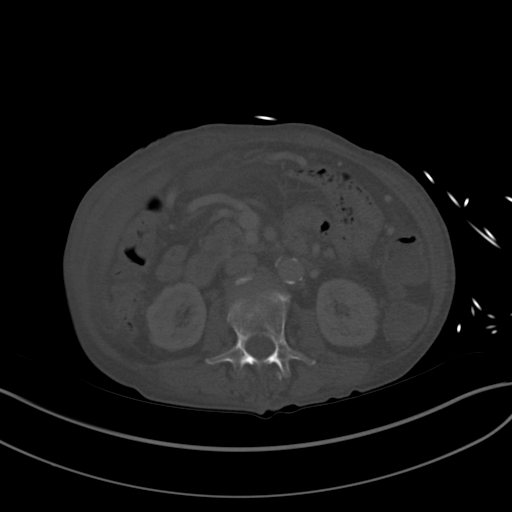
[im 68/85  soft-tissue]
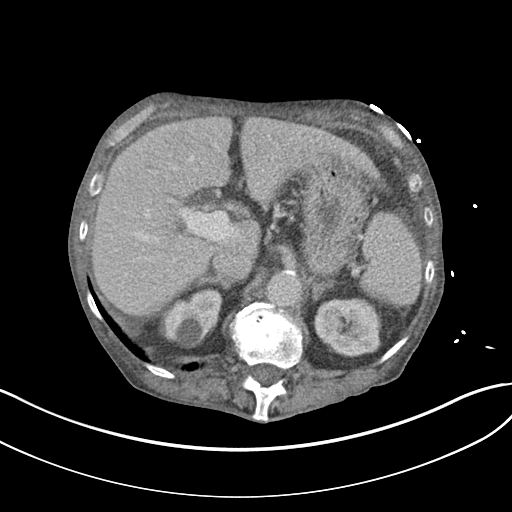
[im 73/85  soft-tissue]
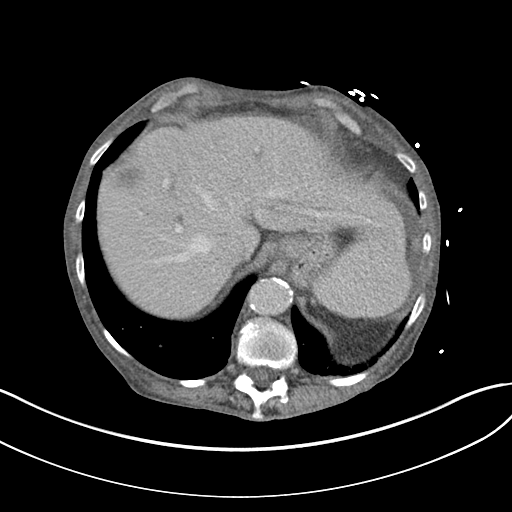
[im 79/85  soft-tissue]
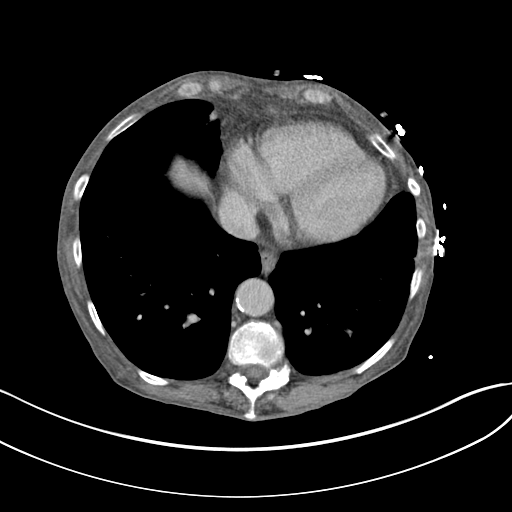

[Series 5: coronal st · coronal · 0.67mm/px · 3 of 133 slices shown]
[im 45/133  soft-tissue]
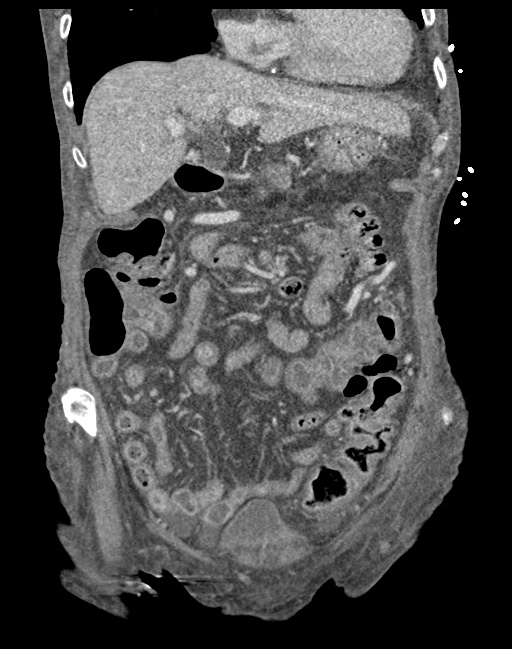
[im 59/133  soft-tissue]
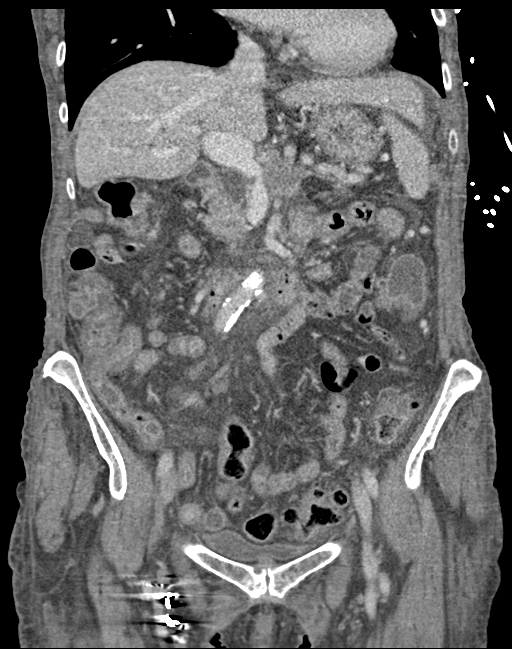
[im 74/133  soft-tissue]
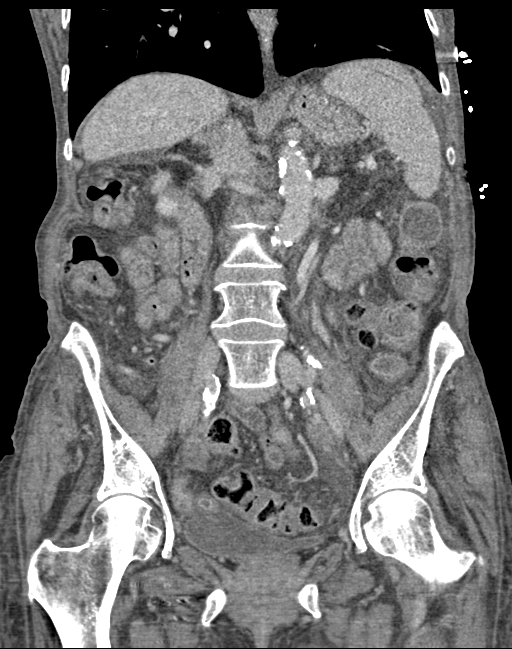

[15 of 46 positions shown; findings below may reference images not displayed]

FINDINGS: Lower chest: Emphysematous changes are seen at the lung bases. No
acute airspace disease.

Hepatobiliary: New hypodensities are seen within the liver
consistent with metastatic disease. Largest in the right lobe image
[DATE] measures 2.1 cm. Gallbladder is surgically absent. Stable
extrahepatic dilatation of the common bile duct, measuring up to 12
mm. Mild intrahepatic duct dilation unchanged.

Pancreas: Hypodense mass within the pancreatic body measures 4.0 x
3.2 cm, increased since prior exam. There is marked extrinsic
narrowing of the portal vein estimated greater than 90%, which has
progressed since prior study. The portal vein does remain patent
however.

Spleen: Normal in size without focal abnormality.

Adrenals/Urinary Tract: Stable right renal cysts. Otherwise the
kidneys enhance normally and symmetrically. No urinary tract calculi
or obstructive uropathy. The adrenals and bladder are unremarkable.

Stomach/Bowel: There is no bowel obstruction or ileus. Normal
appendix right lower quadrant. Mild diffuse bowel wall thickening is
likely due to trace ascites, mesenteric edema, and hypoproteinemic
state given marked cachexia. No inflammatory changes.

Vascular/Lymphatic: As stated above, there is marked extrinsic
compression at the portal venous confluence, with greater than 90%
narrowing. Complete occlusion of the splenic vein just proximal to
the portal confluence, stable. The SMV is patent.

Diffuse atherosclerosis of the aorta and its branches again noted.
IVC is widely patent.

No pathologic adenopathy.

Reproductive: Stable uterine fibroids.  No adnexal masses.

Other: There is trace ascites throughout the abdomen and pelvis.
Mild diffuse mesenteric edema. No free intraperitoneal gas. No
abdominal wall hernia.

Musculoskeletal: Patient is markedly cachectic, and this has
progressed since prior study. No acute or destructive bony lesions.
Stable spondylosis at the thoracolumbar junction.
IMPRESSION: 1. Progression of pancreatic cancer, with enlarging mass in the
pancreatic body causing increasing extrinsic mass effect upon the
portal vein and chronic occlusion of the splenic vein.
2. Interval development of hepatic metastases as above.
3. Progressive cachexia, with trace abdominal ascites and mesenteric
edema.
4. No bowel obstruction or ileus. Mild diffuse bowel wall edema is
felt to be due to adjacent ascites and likely hypoproteinemic state.
5. Aortic Atherosclerosis (1BYKW-YTZ.Z) and Emphysema (1BYKW-MBU.R).

## 2022-02-20 IMAGING — CR DG CHEST 2V
2 series · 2 of 2 positions shown · non-contrast
Comparison: April 26, 2020

CLINICAL DATA: Abdominal pain.

EXAM:
CHEST - 2 VIEW

[w chest lat]
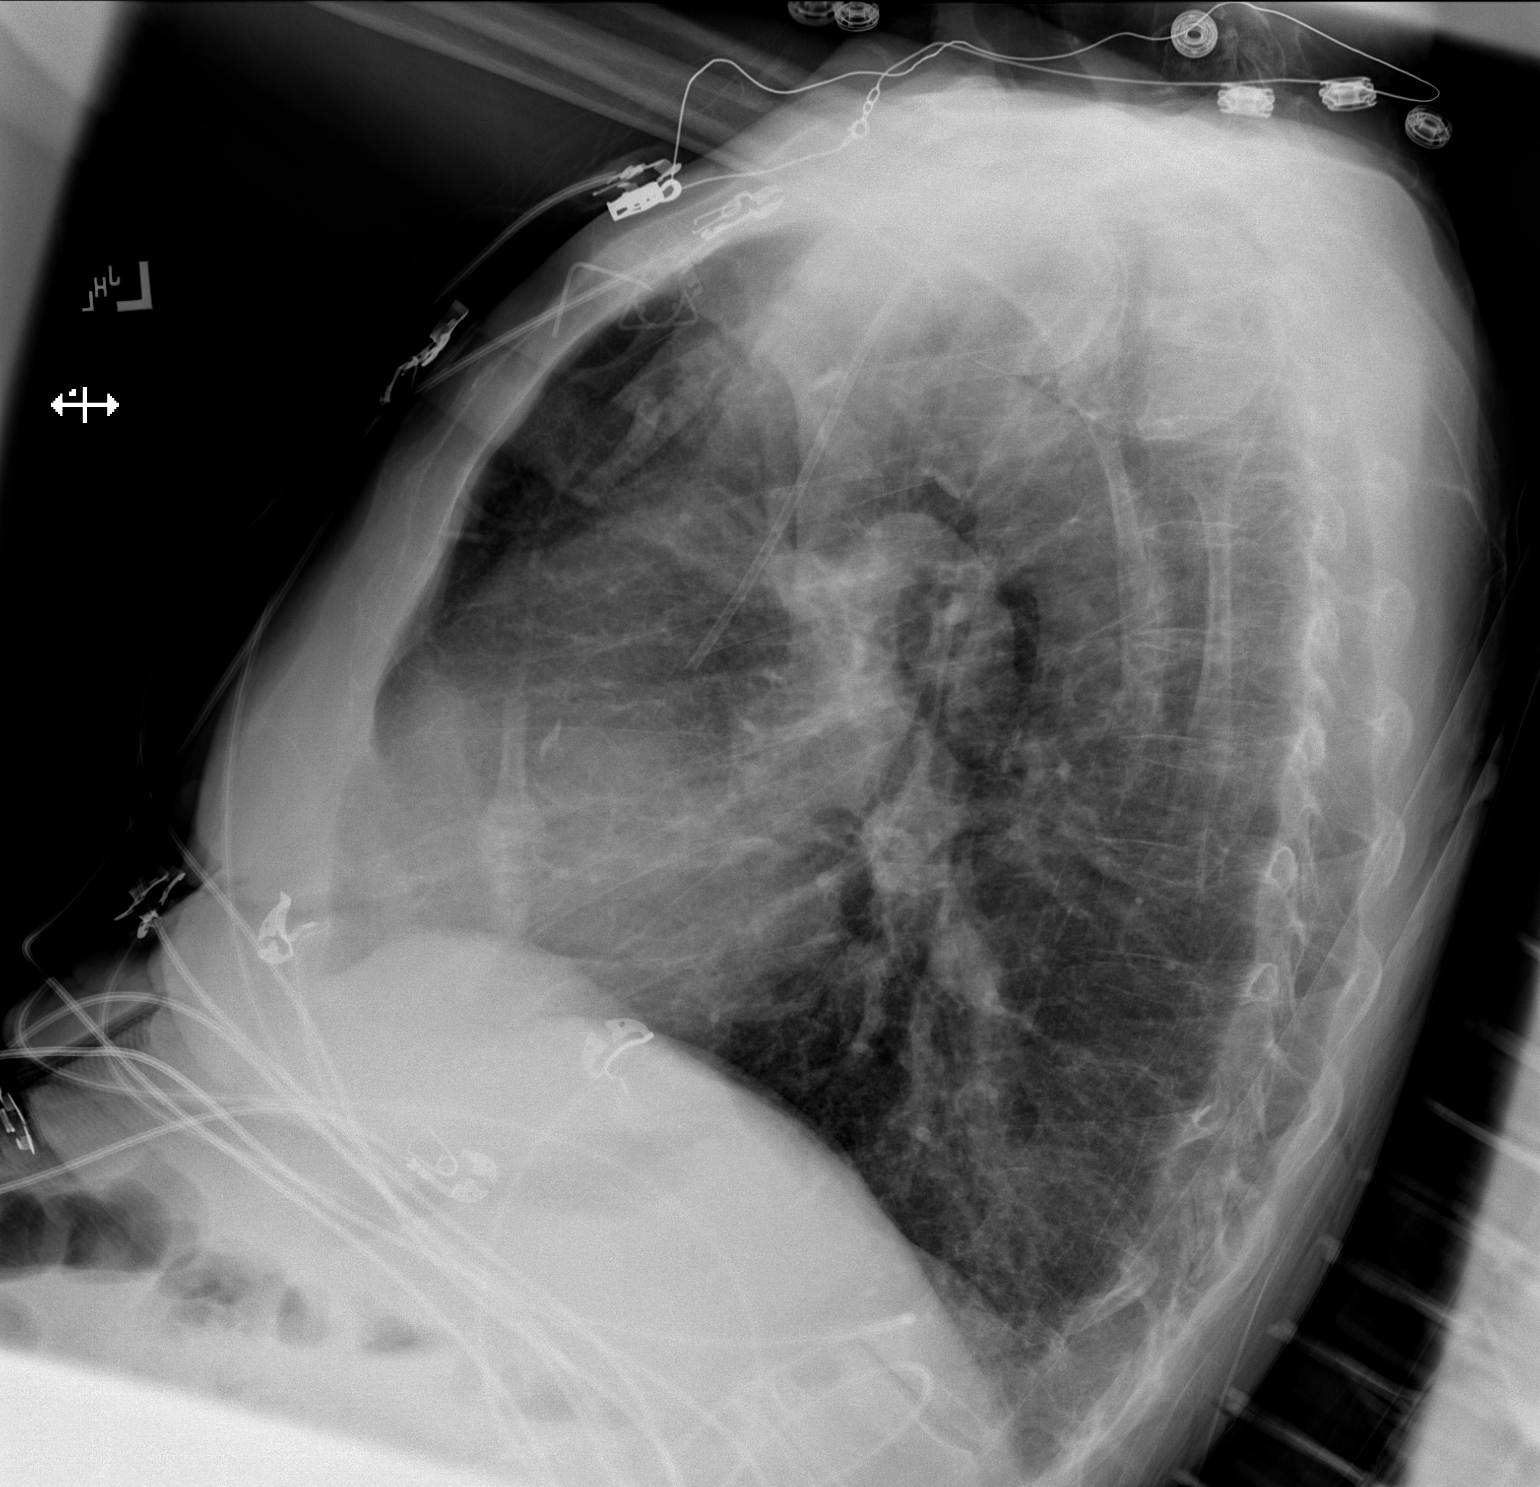

[x chest ap]
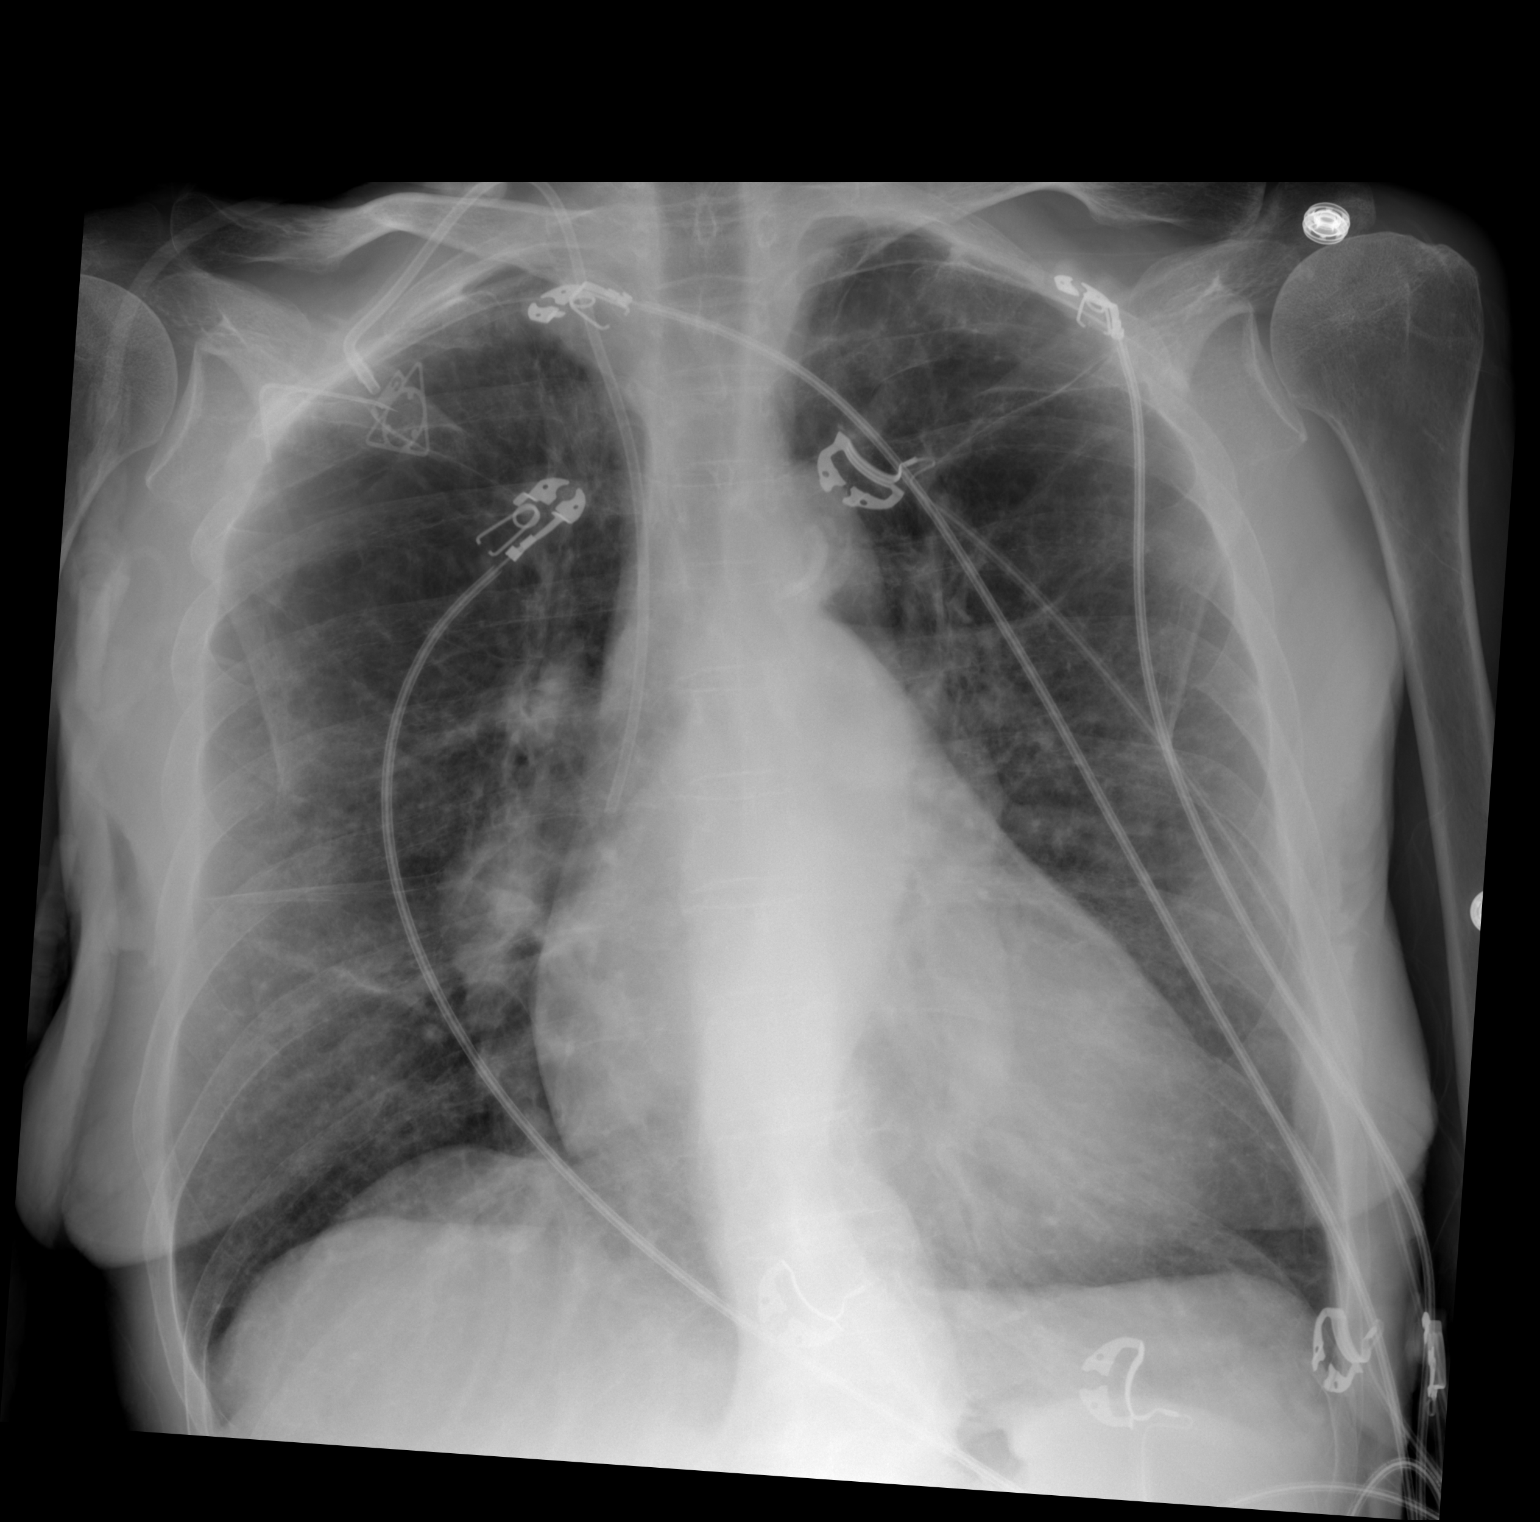

[2 of 2 positions shown; findings below may reference images not displayed]

FINDINGS: Stable right-sided venous Port-A-Cath positioning is seen. The lungs
are hyperinflated. Chronic appearing increased lung markings are
present. There is no evidence of acute infiltrate, pleural effusion
or pneumothorax. The cardiac silhouette is mildly enlarged. Marked
severity calcification of the aortic arch is seen. The visualized
skeletal structures are unremarkable.
IMPRESSION: COPD and cardiomegaly without evidence of acute or active
cardiopulmonary disease.

## 2023-01-31 NOTE — Telephone Encounter (Signed)
Telephone call
